# Patient Record
Sex: Male | Born: 1945
Health system: Southern US, Community
[De-identification: ages and names within clinical notes are randomized; demographics above are authoritative.]

## PROBLEM LIST (undated history)

## (undated) DIAGNOSIS — C61 Malignant neoplasm of prostate: Secondary | ICD-10-CM

## (undated) DIAGNOSIS — J449 Chronic obstructive pulmonary disease, unspecified: Secondary | ICD-10-CM

## (undated) DIAGNOSIS — E7800 Pure hypercholesterolemia, unspecified: Secondary | ICD-10-CM

## (undated) DIAGNOSIS — I712 Thoracic aortic aneurysm, without rupture, unspecified: Secondary | ICD-10-CM

## (undated) DIAGNOSIS — I700 Atherosclerosis of aorta: Secondary | ICD-10-CM

## (undated) DIAGNOSIS — I10 Essential (primary) hypertension: Secondary | ICD-10-CM

## (undated) DIAGNOSIS — I2510 Atherosclerotic heart disease of native coronary artery without angina pectoris: Secondary | ICD-10-CM

## (undated) HISTORY — DX: Thoracic aortic aneurysm, without rupture, unspecified: I71.20

## (undated) HISTORY — PX: ROTATOR CUFF REPAIR: SHX139

## (undated) HISTORY — DX: Atherosclerosis of aorta: I70.0

## (undated) HISTORY — DX: Thoracic aortic aneurysm, without rupture: I71.2

## (undated) HISTORY — PX: ABDOMINAL SURGERY: SHX537

## (undated) HISTORY — PX: NECK SURGERY: SHX720

## (undated) HISTORY — DX: Atherosclerotic heart disease of native coronary artery without angina pectoris: I25.10

---

## 2001-03-16 ENCOUNTER — Emergency Department (HOSPITAL_COMMUNITY): Admission: EM | Admit: 2001-03-16 | Discharge: 2001-03-16 | Payer: Self-pay | Admitting: Emergency Medicine

## 2001-03-16 ENCOUNTER — Encounter: Payer: Self-pay | Admitting: Emergency Medicine

## 2003-10-12 ENCOUNTER — Emergency Department (HOSPITAL_COMMUNITY): Admission: EM | Admit: 2003-10-12 | Discharge: 2003-10-13 | Payer: Self-pay | Admitting: Emergency Medicine

## 2013-07-07 ENCOUNTER — Emergency Department (HOSPITAL_COMMUNITY): Payer: Medicare Other

## 2013-07-07 ENCOUNTER — Encounter (HOSPITAL_COMMUNITY): Payer: Self-pay | Admitting: Emergency Medicine

## 2013-07-07 ENCOUNTER — Emergency Department (HOSPITAL_COMMUNITY)
Admission: EM | Admit: 2013-07-07 | Discharge: 2013-07-07 | Disposition: A | Discharge status: Home / Self Care | Payer: Medicare Other | Attending: Emergency Medicine | Admitting: Emergency Medicine

## 2013-07-07 DIAGNOSIS — R4182 Altered mental status, unspecified: Secondary | ICD-10-CM

## 2013-07-07 DIAGNOSIS — J449 Chronic obstructive pulmonary disease, unspecified: Secondary | ICD-10-CM | POA: Insufficient documentation

## 2013-07-07 DIAGNOSIS — M6281 Muscle weakness (generalized): Secondary | ICD-10-CM

## 2013-07-07 DIAGNOSIS — E7800 Pure hypercholesterolemia, unspecified: Secondary | ICD-10-CM | POA: Insufficient documentation

## 2013-07-07 DIAGNOSIS — J4489 Other specified chronic obstructive pulmonary disease: Secondary | ICD-10-CM | POA: Insufficient documentation

## 2013-07-07 DIAGNOSIS — I10 Essential (primary) hypertension: Secondary | ICD-10-CM | POA: Insufficient documentation

## 2013-07-07 DIAGNOSIS — R404 Transient alteration of awareness: Secondary | ICD-10-CM

## 2013-07-07 DIAGNOSIS — R531 Weakness: Secondary | ICD-10-CM

## 2013-07-07 DIAGNOSIS — R112 Nausea with vomiting, unspecified: Secondary | ICD-10-CM

## 2013-07-07 DIAGNOSIS — R42 Dizziness and giddiness: Secondary | ICD-10-CM | POA: Insufficient documentation

## 2013-07-07 HISTORY — DX: Pure hypercholesterolemia, unspecified: E78.00

## 2013-07-07 HISTORY — DX: Essential (primary) hypertension: I10

## 2013-07-07 HISTORY — DX: Chronic obstructive pulmonary disease, unspecified: J44.9

## 2013-07-07 LAB — I-STAT CHEM 8, ED
BUN: 22 mg/dL (ref 6–23)
CHLORIDE: 102 meq/L (ref 96–112)
Calcium, Ion: 1.12 mmol/L — ABNORMAL LOW (ref 1.13–1.30)
Creatinine, Ser: 1 mg/dL (ref 0.50–1.35)
Glucose, Bld: 131 mg/dL — ABNORMAL HIGH (ref 70–99)
HCT: 44 % (ref 39.0–52.0)
Hemoglobin: 15 g/dL (ref 13.0–17.0)
Potassium: 4.6 mEq/L (ref 3.7–5.3)
SODIUM: 139 meq/L (ref 137–147)
TCO2: 32 mmol/L (ref 0–100)

## 2013-07-07 LAB — URINALYSIS, ROUTINE W REFLEX MICROSCOPIC
BILIRUBIN URINE: NEGATIVE
Glucose, UA: NEGATIVE mg/dL
Hgb urine dipstick: NEGATIVE
KETONES UR: NEGATIVE mg/dL
LEUKOCYTES UA: NEGATIVE
Nitrite: NEGATIVE
PH: 8 (ref 5.0–8.0)
Protein, ur: NEGATIVE mg/dL
SPECIFIC GRAVITY, URINE: 1.02 (ref 1.005–1.030)
UROBILINOGEN UA: 0.2 mg/dL (ref 0.0–1.0)

## 2013-07-07 LAB — CBC
HCT: 42 % (ref 39.0–52.0)
Hemoglobin: 14.2 g/dL (ref 13.0–17.0)
MCH: 31.2 pg (ref 26.0–34.0)
MCHC: 33.8 g/dL (ref 30.0–36.0)
MCV: 92.3 fL (ref 78.0–100.0)
Platelets: 188 10*3/uL (ref 150–400)
RBC: 4.55 MIL/uL (ref 4.22–5.81)
RDW: 13.3 % (ref 11.5–15.5)
WBC: 11.9 10*3/uL — ABNORMAL HIGH (ref 4.0–10.5)

## 2013-07-07 LAB — COMPREHENSIVE METABOLIC PANEL
ALBUMIN: 3.7 g/dL (ref 3.5–5.2)
ALT: 30 U/L (ref 0–53)
AST: 33 U/L (ref 0–37)
Alkaline Phosphatase: 95 U/L (ref 39–117)
BILIRUBIN TOTAL: 0.3 mg/dL (ref 0.3–1.2)
BUN: 19 mg/dL (ref 6–23)
CHLORIDE: 102 meq/L (ref 96–112)
CO2: 30 meq/L (ref 19–32)
Calcium: 9.3 mg/dL (ref 8.4–10.5)
Creatinine, Ser: 0.86 mg/dL (ref 0.50–1.35)
GFR calc Af Amer: >90 mL/min (ref >90)
GFR, EST NON AFRICAN AMERICAN: 88 mL/min — AB (ref >90)
Glucose, Bld: 135 mg/dL — ABNORMAL HIGH (ref 70–99)
POTASSIUM: 4.9 meq/L (ref 3.7–5.3)
SODIUM: 141 meq/L (ref 137–147)
Total Protein: 6.7 g/dL (ref 6.0–8.3)

## 2013-07-07 LAB — CBG MONITORING, ED: Glucose-Capillary: 130 mg/dL — ABNORMAL HIGH (ref 70–99)

## 2013-07-07 LAB — PROTIME-INR
INR: 0.88 (ref 0.00–1.49)
Prothrombin Time: 11.8 seconds (ref 11.6–15.2)

## 2013-07-07 LAB — DIFFERENTIAL
BASOS ABS: 0 10*3/uL (ref 0.0–0.1)
BASOS PCT: 0 % (ref 0–1)
Eosinophils Absolute: 0.1 10*3/uL (ref 0.0–0.7)
Eosinophils Relative: 1 % (ref 0–5)
LYMPHS PCT: 15 % (ref 12–46)
Lymphs Abs: 1.8 10*3/uL (ref 0.7–4.0)
Monocytes Absolute: 0.8 10*3/uL (ref 0.1–1.0)
Monocytes Relative: 7 % (ref 3–12)
NEUTROS ABS: 9.3 10*3/uL — AB (ref 1.7–7.7)
NEUTROS PCT: 77 % (ref 43–77)

## 2013-07-07 LAB — RAPID URINE DRUG SCREEN, HOSP PERFORMED
Amphetamines: NOT DETECTED
Barbiturates: NOT DETECTED
Benzodiazepines: NOT DETECTED
COCAINE: NOT DETECTED
Opiates: POSITIVE — AB
Tetrahydrocannabinol: NOT DETECTED

## 2013-07-07 LAB — ETHANOL: Alcohol, Ethyl (B): <11 mg/dL (ref 0–11)

## 2013-07-07 LAB — APTT: APTT: 24 s (ref 24–37)

## 2013-07-07 LAB — I-STAT TROPONIN, ED: Troponin i, poc: 0 ng/mL (ref 0.00–0.08)

## 2013-07-07 MED ORDER — ONDANSETRON HCL 4 MG/2ML IJ SOLN
INTRAMUSCULAR | Status: AC
  Filled 2013-07-07: qty 2

## 2013-07-07 MED ORDER — ONDANSETRON HCL 4 MG/2ML IJ SOLN
4.0000 mg | Freq: Once | INTRAMUSCULAR | Status: AC
  Administered 2013-07-07: 4 mg via INTRAVENOUS

## 2013-07-07 MED ORDER — METOCLOPRAMIDE HCL 5 MG/ML IJ SOLN
10.0000 mg | Freq: Once | INTRAMUSCULAR | Status: AC
  Administered 2013-07-07: 10 mg via INTRAVENOUS
  Filled 2013-07-07: qty 2

## 2013-07-07 NOTE — ED Provider Notes (Signed)
CSN: [id number]     Arrival date & time 07/07/13  1202 History   First MD Initiated Contact with Patient 07/07/13 1207     Chief Complaint  Patient presents with  . Code Stroke     (Consider location/radiation/quality/duration/timing/severity/associated sxs/prior Treatment) Patient is a 68 y.o. male presenting with altered mental status. The history is provided by the patient and the EMS personnel.  Altered Mental Status Presenting symptoms: behavior changes, disorientation, lethargy and partial responsiveness   Severity:  Moderate Most recent episode:  Today Episode history:  Continuous Duration:  1 hour Timing:  Constant Progression:  Partially resolved Chronicity:  New Context: recent illness (began vomiting and felt ill upon arriving to work)   Associated symptoms: light-headedness, nausea and vomiting   Associated symptoms: no abdominal pain, no difficulty breathing, no fever and no palpitations     No past medical history on file. No past surgical history on file. No family history on file. History  Substance Use Topics  . Smoking status: Not on file  . Smokeless tobacco: Not on file  . Alcohol Use: Not on file    Review of Systems  Constitutional: Negative for fever.  Cardiovascular: Negative for palpitations.  Gastrointestinal: Positive for nausea and vomiting. Negative for abdominal pain.  Neurological: Positive for light-headedness.  All other systems reviewed and are negative.     Allergies  Review of patient's allergies indicates not on file.  Home Medications   Prior to Admission medications   Not on File   There were no vitals taken for this visit. Physical Exam  Constitutional: He appears well-developed and well-nourished. No distress.  HENT:  Head: Normocephalic and atraumatic.  Eyes: Conjunctivae are normal.  Neck: Neck supple. No tracheal deviation present.  Cardiovascular: Normal rate and regular rhythm.   Pulmonary/Chest: Effort normal.  No respiratory distress.  Abdominal: Soft. He exhibits no distension.  Neurological:  Inconsistent neuro exam with slowing and left-sided resistance to pass of motion with equal strength intact, 3 mm equal and reactive pupils bilaterally, appropriate withdrawal to noxious stimuli  Skin: Skin is warm and dry.  Psychiatric: He is slowed (with improvement over the course of the visit).    ED Course  Procedures (including critical care time) Labs Review Labs Reviewed  CBC - Abnormal; Notable for the following:    WBC 11.9 (*)    All other components within normal limits  DIFFERENTIAL - Abnormal; Notable for the following:    Neutro Abs 9.3 (*)    All other components within normal limits  COMPREHENSIVE METABOLIC PANEL - Abnormal; Notable for the following:    Glucose, Bld 135 (*)    GFR calc non Af Amer 88 (*)    All other components within normal limits  URINE RAPID DRUG SCREEN (HOSP PERFORMED) - Abnormal; Notable for the following:    Opiates POSITIVE (*)    All other components within normal limits  I-STAT CHEM 8, ED - Abnormal; Notable for the following:    Glucose, Bld 131 (*)    Calcium, Ion 1.12 (*)    All other components within normal limits  CBG MONITORING, ED - Abnormal; Notable for the following:    Glucose-Capillary 130 (*)    All other components within normal limits  ETHANOL  PROTIME-INR  APTT  URINALYSIS, ROUTINE W REFLEX MICROSCOPIC  I-STAT TROPOININ, ED  I-STAT TROPOININ, ED    Imaging Review Ct Head Wo Contrast  07/07/2013   CLINICAL DATA:  Left side weakness, code stroke  EXAM: CT HEAD WITHOUT CONTRAST  TECHNIQUE: Contiguous axial images were obtained from the base of the skull through the vertex without intravenous contrast.  COMPARISON:  07/06/2005; correlation MRI brain 01/14/2010  FINDINGS: Normal ventricular morphology.  No midline shift or mass effect.  Normal appearance of brain parenchyma.  No intracranial hemorrhage, mass lesion or evidence acute  infarction.  No extra-axial fluid collections.  Nasopharyngeal airway present.  Bones and sinuses unremarkable.  IMPRESSION: No acute intracranial abnormalities.  Findings called to Dr. Nicole Kindred on 07/07/2013 at 1236 hrs.   Electronically Signed   By: Lavonia Dana M.D.   On: 07/07/2013 12:39   Dg Chest Port 1 View  07/07/2013   CLINICAL DATA:  Hypertension  EXAM: PORTABLE CHEST - 1 VIEW  COMPARISON:  07/24/2009  FINDINGS: The heart size and mediastinal contours are within normal limits. Both lungs are clear. The visualized skeletal structures are unremarkable.  IMPRESSION: No active disease.   Electronically Signed   By: Inez Catalina M.D.   On: 07/07/2013 14:38     EKG Interpretation   Date/Time:  Monday July 07 2013 12:18:26 EDT Ventricular Rate:  70 PR Interval:  153 QRS Duration: 104 QT Interval:  415 QTC Calculation: 448 R Axis:   76 Text Interpretation:  Sinus rhythm No prior for comparison Confirmed by  HORTON  MD, Loma Sousa ([id number]) on 07/07/2013 2:01:23 PM      MDM   Final diagnoses:  Nausea & vomiting  Altered mental status  68 y.o. male presents with altered have ear following an episode where he was driving to work, parking lot, and he came diaphoretic and nauseated with vomiting. The patient was resolving on arrival and was resistance to moving his left arm presumably due to pain after speaking with family but retains full strength of the extremity. He has had some left shoulder problems for the last months and has been seen and evaluated by pain management for this. His nausea is controlled with Zofran followed by Reglan for refractory symptoms, the patient was able to sleep comfortably in the emergency department, and resolved completely without any further symptoms.  Patient was initially triaged as a code stroke due to odd behavior in route with EMS but a negative CT scan, negative infectious workup, negative metabolic workup. The patient is positive for opiates and states that  he has been taking Norco for his shoulder pain recently but a normal amount. Based on the story and complete resolution of symptoms is likely to be related to overheating in the car, ingestion of home opiates, vasovagal syncope in the setting of nausea and vomiting, or other transient etiology. There is no evidence of intracranial cause for abnormality with the patient's heart. Discussed with the family to followup with primary care physician this week return to the emergency department with any refractory symptoms or repeat altered mental status.    Leo Grosser, MD 07/07/13 [phone]

## 2013-07-07 NOTE — ED Notes (Addendum)
Pt to ED via [facility] for L sided weakness; EMS was called out for pt "not acting his normal self;" Pt diaphoretic, pale, cold to the touch and c/o nausea. Per EMS, en route patient became "unresponsive," nasal trumpet placed,  grips weaker on L side.  BP 161/100; HR 50s, EKG showing sinus brady.

## 2013-07-07 NOTE — ED Notes (Signed)
Nurse said she walked him and he did well

## 2013-07-07 NOTE — Discharge Instructions (Signed)
Altered Mental Status Altered mental status most often refers to an abnormal change in your responsiveness and awareness. It can affect your speech, thought, mobility, memory, attention span, or alertness. It can range from slight confusion to complete unresponsiveness (coma). Altered mental status can be a sign of a serious underlying medical condition. Rapid evaluation and medical treatment is necessary for patients having an altered mental status. CAUSES   Low blood sugar (hypoglycemia) or diabetes.  Severe loss of body fluids (dehydration) or a body salt (electrolyte) imbalance.  A stroke or other neurologic problem, such as dementia or delirium.  A head injury or tumor.  A drug or alcohol overdose.  Exposure to toxins or poisons.  Depression, anxiety, and stress.  A low oxygen level (hypoxia).  An infection.  Blood loss.  Twitching or shaking (seizure).  Heart problems, such as heart attack or heart rhythm problems (arrhythmias).  A body temperature that is too low or too high (hypothermia or hyperthermia). DIAGNOSIS  A diagnosis is based on your history, symptoms, physical and neurologic examinations, and diagnostic tests. Diagnostic tests may include:  Measurement of your blood pressure, pulse, breathing, and oxygen levels (vital signs).  Blood tests.  Urine tests.  X-ray exams.  A computerized magnetic scan (magnetic resonance imaging, MRI).  A computerized X-ray scan (computed tomography, CT scan). TREATMENT  Treatment will depend on the cause. Treatment may include:  Management of an underlying medical or mental health condition.  Critical care or support in the hospital. [place]   Only take over-the-counter or prescription medicines for pain, discomfort, or fever as directed by your caregiver.  Manage underlying conditions as directed by your caregiver.  Eat a healthy, well-balanced diet to maintain strength.  Join a support group or  prevention program to cope with the condition or trauma that caused the altered mental status. Ask your caregiver to help choose a program that works for you.  Follow up with your caregiver for further examination, therapy, or testing as directed. SEEK MEDICAL CARE IF:   You feel unwell or have chills.  You or your family notice a change in your behavior or your alertness.  You have trouble following your caregiver's treatment plan.  You have questions or concerns. SEEK IMMEDIATE MEDICAL CARE IF:   You have a rapid heartbeat or have chest pain.  You have difficulty breathing.  You have a fever.  You have a headache with a stiff neck.  You cough up blood.  You have blood in your urine or stool.  You have severe agitation or confusion. MAKE SURE YOU:   Understand these instructions.  Will watch your condition.  Will get help right away if you are not doing well or get worse. Document Released: 07/13/2009 Document Revised: 04/17/2011 Document Reviewed: 07/13/2009 [facility] Patient Information 2014 [place]. Nausea and Vomiting Nausea is a sick feeling that often comes before throwing up (vomiting). Vomiting is a reflex where stomach contents come out of your mouth. Vomiting can cause severe loss of body fluids (dehydration). Children and elderly adults can become dehydrated quickly, especially if they also have diarrhea. Nausea and vomiting are symptoms of a condition or disease. It is important to find the cause of your symptoms. CAUSES   Direct irritation of the stomach lining. This irritation can result from increased acid production (gastroesophageal reflux disease), infection, food poisoning, taking certain medicines (such as nonsteroidal anti-inflammatory drugs), alcohol use, or tobacco use.  Signals from the brain.These signals could be caused by  a headache, heat exposure, an inner ear disturbance, increased pressure in the brain from injury, infection, a tumor,  or a concussion, pain, emotional stimulus, or metabolic problems.  An obstruction in the gastrointestinal tract (bowel obstruction).  Illnesses such as diabetes, hepatitis, gallbladder problems, appendicitis, kidney problems, cancer, sepsis, atypical symptoms of a heart attack, or eating disorders.  Medical treatments such as chemotherapy and radiation.  Receiving medicine that makes you sleep (general anesthetic) during surgery. DIAGNOSIS Your caregiver may ask for tests to be done if the problems do not improve after a few days. Tests may also be done if symptoms are severe or if the reason for the nausea and vomiting is not clear. Tests may include:  Urine tests.  Blood tests.  Stool tests.  Cultures (to look for evidence of infection).  X-rays or other imaging studies. Test results can help your caregiver make decisions about treatment or the need for additional tests. TREATMENT You need to stay well hydrated. Drink frequently but in small amounts.You may wish to drink water, sports drinks, clear broth, or eat frozen ice pops or gelatin dessert to help stay hydrated.When you eat, eating slowly may help prevent nausea.There are also some antinausea medicines that may help prevent nausea. HOME CARE INSTRUCTIONS   Take all medicine as directed by your caregiver.  If you do not have an appetite, do not force yourself to eat. However, you must continue to drink fluids.  If you have an appetite, eat a normal diet unless your caregiver tells you differently.  Eat a variety of complex carbohydrates (rice, wheat, potatoes, bread), lean meats, yogurt, fruits, and vegetables.  Avoid high-fat foods because they are more difficult to digest.  Drink enough water and fluids to keep your urine clear or pale yellow.  If you are dehydrated, ask your caregiver for specific rehydration instructions. Signs of dehydration may include:  Severe thirst.  Dry lips and  mouth.  Dizziness.  Dark urine.  Decreasing urine frequency and amount.  Confusion.  Rapid breathing or pulse. SEEK IMMEDIATE MEDICAL CARE IF:   You have blood or brown flecks (like coffee grounds) in your vomit.  You have black or bloody stools.  You have a severe headache or stiff neck.  You are confused.  You have severe abdominal pain.  You have chest pain or trouble breathing.  You do not urinate at least once every 8 hours.  You develop cold or clammy skin.  You continue to vomit for longer than 24 to 48 hours.  You have a fever. MAKE SURE YOU:   Understand these instructions.  Will watch your condition.  Will get help right away if you are not doing well or get worse. Document Released: 01/23/2005 Document Revised: 04/17/2011 Document Reviewed: 06/22/2010 [facility] Patient Information 2014 [place], [place].

## 2013-07-07 NOTE — ED Notes (Signed)
Dr. Horton at bedside. 

## 2013-07-07 NOTE — ED Provider Notes (Signed)
I saw and evaluated the patient, reviewed the resident's note and I agree with the findings and plan.   EKG Interpretation   Date/Time:  Monday July 07 2013 12:18:26 EDT Ventricular Rate:  70 PR Interval:  153 QRS Duration: 104 QT Interval:  415 QTC Calculation: 448 R Axis:   76 Text Interpretation:  Sinus rhythm No prior for comparison Confirmed by  HORTON  MD, Loma Sousa ([id number]) on 07/07/2013 2:01:23 PM      Patient presented as code stroke.  ON initial evaluation, somnolent but arousable.  Nonfocal.  CT neg and evaluated by neurology.  Low suspicion of stroke.  After approx 15 min, patient back to baseline.  Reports taking "pain medication" this AM but states that is normal for him.  Reports that he didn't feel good, got hot and vomited.  Denies SOB or CP.  Nontoxic and nonfocal on exam.  W/U neg including EKG and trop.  Patient at baseline, ambulatory and able to PO challenge.  Unknown cause of AMS this morning but w/u reassuring.    After history, exam, and medical workup I feel the patient has been appropriately medically screened and is safe for discharge home. Pertinent diagnoses were discussed with the patient. Patient was given return precautions.     Merryl Hacker, MD 07/07/13 [phone]

## 2013-07-07 NOTE — Code Documentation (Signed)
68yo male arriving to [facility] via [facility].  EMS was called due to patient having to be assisted from his car this morning at work at 1100.  EMS called a Code Stroke due to left sided weakness assessed en route.  Patient with n/v and hypertensive.  Patient taken to CT on arrival.  NIHSS 1 on arrival for decreased sensation on the left side.  Patient with good strength on left side per Dr. Nicole Kindred, but resists during exam.  RRT RN called patient's girlfriend who reports that the patient was not feeling well at 0900 this morning d/t n/v.  He later called her around 1030 and said that he might not go to work d/t not feeling well.  She reports that he has had left sided weakness for weeks now and has been going to the [place] for this.  Patient reports pain in the left arm and also reports that he goes to the [place] for his neck and arm.  Patient back to the room.  Patient not cooperating well with exam.  Patient vomiting despite Zofran IVP.  Dr. Nicole Kindred at bedside for exam.  Code stroke canceled.  No acute stroke treatment at this time.  Bedside handoff with ED RN Elmyra Ricks.

## 2013-07-07 NOTE — ED Provider Notes (Signed)
12:10 PM  Patient assessed as a stroke upon arrival. Last seen normal report of 11 a.m. when he drove himself to work. Reported feeling poorly. Noted by coworkers at [employer] to be pale, diaphoretic, and "unresponsive." EMS was called. Upon their evaluation, he was not able to ride much history. He was noted to be pale, diaphoretic, and vomiting. EMS noted left-sided weakness. History of hypertension. Noted to be hypertensive in route.  Patient is following commands but not speaking on exam. No lateralizing weakness noted. Proceeded to CT scanner for further evaluation.  Merryl Hacker, MD 07/07/13 1212

## 2013-07-07 NOTE — Consult Note (Signed)
Reason for Consult: altered mental status with equivocal left side weakness  HPI:                                                                                                                                          Blake Sutton is an 68 y.o. male with a history of hypertension, hypercholesterolemia and COPD who was brought to the emergency room with altered mental status and equivocal left-sided weakness which was noted by EMS in route to the hospital. He was initially noted to be lethargic with nausea and vomiting as well as paleness. No focal deficit was noted on initial evaluation in the emergency room. CT scan of his head was obtained which showed no acute intracranial abnormality. Patient's mental status improved during his emergency room evaluation. Examination showed reduced movement of his left extremities do to pain with movement, particularly his left upper extremity with reduced range of motion of his shoulder secondary to pain and tenderness.it equivocal numbness involving left side of his face compared to right. Otherwise neurological examination was unremarkable. Code stroke was canceled.  Past Medical History  Diagnosis Date  . Hypertension   . Hypercholesteremia   . COPD (chronic obstructive pulmonary disease)     Past Surgical History  Procedure Laterality Date  . Rotator cuff repair    . Neck surgery    . Abdominal surgery      "repair bleeding ulcers"    No family history on file.  Social History:  reports that he has been smoking.  He does not have any smokeless tobacco history on file. He reports that he does not drink alcohol or use illicit drugs.  Allergies not on file  MEDICATIONS:                                                                                                                     Currently unavailable.   ROS:  History obtained from the patient  General ROS: negative for - chills, fatigue, fever, night sweats, weight gain or weight loss Psychological ROS: negative for - behavioral disorder, hallucinations, memory difficulties, mood swings or suicidal ideation Ophthalmic ROS: negative for - blurry vision, double vision, eye pain or loss of vision ENT ROS: negative for - epistaxis, nasal discharge, oral lesions, sore throat, tinnitus or vertigo Allergy and Immunology ROS: negative for - hives or itchy/watery eyes Hematological and Lymphatic ROS: negative for - bleeding problems, bruising or swollen lymph nodes Endocrine ROS: negative for - galactorrhea, hair pattern changes, polydipsia/polyuria or temperature intolerance Respiratory ROS: negative for - cough, hemoptysis, shortness of breath or wheezing Cardiovascular ROS: negative for - chest pain, dyspnea on exertion, edema or irregular heartbeat Gastrointestinal ROS: negative for - abdominal pain, diarrhea, hematemesis, nausea/vomiting or stool incontinence Genito-Urinary ROS: negative for - dysuria, hematuria, incontinence or urinary frequency/urgency Musculoskeletal ROS: chronic left neck and shoulder pain Neurological ROS: as noted in HPI Dermatological ROS: negative for rash and skin lesion changes   There were no vitals taken for this visit.   Neurologic Examination:                                                                                                      Mental Status: Alert, oriented, thought content appropriate.  Speech fluent without evidence of aphasia. Able to follow commands without difficulty. Cranial Nerves: II-Visual fields were normal. III/IV/VI-Pupils were equal and reacted. Extraocular movements were full and conjugate.    V/VII-equivocal mild left facial numbness; no facial weakness. VIII-normal. X-normal speech. Motor: 5/5 bilaterally with normal tone and bulk Sensory: Normal throughout. Deep Tendon  Reflexes: 2+ and symmetric. Plantars: Flexor bilaterally Cerebellar: Normal finger-to-nose testing.  No results found for this basename: cbc, bmp, coags, chol, tri, ldl, hga1c    Results for orders placed during the hospital encounter of 07/07/13 (from the past 48 hour(s))  APTT     Status: None   Collection Time    07/07/13 12:10 PM      Result Value Ref Range   aPTT 24  24 - 37 seconds  CBC     Status: Abnormal   Collection Time    07/07/13 12:10 PM      Result Value Ref Range   WBC 11.9 (*) 4.0 - 10.5 K/uL   RBC 4.55  4.22 - 5.81 MIL/uL   Hemoglobin 14.2  13.0 - 17.0 g/dL   HCT 42.0  39.0 - 52.0 %   MCV 92.3  78.0 - 100.0 fL   MCH 31.2  26.0 - 34.0 pg   MCHC 33.8  30.0 - 36.0 g/dL   RDW 13.3  11.5 - 15.5 %   Platelets 188  150 - 400 K/uL  DIFFERENTIAL     Status: Abnormal   Collection Time    07/07/13 12:10 PM      Result Value Ref Range   Neutrophils Relative % 77  43 - 77 %   Neutro Abs 9.3 (*) 1.7 - 7.7 K/uL   Lymphocytes Relative 15  12 -  46 %   Lymphs Abs 1.8  0.7 - 4.0 K/uL   Monocytes Relative 7  3 - 12 %   Monocytes Absolute 0.8  0.1 - 1.0 K/uL   Eosinophils Relative 1  0 - 5 %   Eosinophils Absolute 0.1  0.0 - 0.7 K/uL   Basophils Relative 0  0 - 1 %   Basophils Absolute 0.0  0.0 - 0.1 K/uL  I-STAT TROPOININ, ED     Status: None   Collection Time    07/07/13 12:14 PM      Result Value Ref Range   Troponin i, poc 0.00  0.00 - 0.08 ng/mL   Comment 3            Comment: Due to the release kinetics of cTnI,     a negative result within the first hours     of the onset of symptoms does not rule out     myocardial infarction with certainty.     If myocardial infarction is still suspected,     repeat the test at appropriate intervals.  CBG MONITORING, ED     Status: Abnormal   Collection Time    07/07/13 12:15 PM      Result Value Ref Range   Glucose-Capillary 130 (*) 70 - 99 mg/dL   Comment 1 Notify RN     Comment 2 Documented in Chart    I-STAT CHEM  8, ED     Status: Abnormal   Collection Time    07/07/13 12:16 PM      Result Value Ref Range   Sodium 139  137 - 147 mEq/L   Potassium 4.6  3.7 - 5.3 mEq/L   Chloride 102  96 - 112 mEq/L   BUN 22  6 - 23 mg/dL   Creatinine, Ser 1.00  0.50 - 1.35 mg/dL   Glucose, Bld 131 (*) 70 - 99 mg/dL   Calcium, Ion 1.12 (*) 1.13 - 1.30 mmol/L   TCO2 32  0 - 100 mmol/L   Hemoglobin 15.0  13.0 - 17.0 g/dL   HCT 44.0  39.0 - 52.0 %    No results found.   Assessment/Plan: 68 year old man with altered mental status and reduced movements of his left extremities which appears to be due to guarding and reduced range of motion of extremities secondary to pain with movement. Patient has no clinical signs of acute TIA or stroke.  No further neurological intervention is indicated no further neurodiagnostic studies warranted.  C.R. Nicole Kindred, MD [facility] [phone]  07/07/2013, 12:40 PM

## 2013-07-07 NOTE — ED Notes (Signed)
Per pt, he drove himself to a shop where he hangs out; reports feeling sick since this morning associated with nausea, vomiting, and diaphoretic. Has been seen at [facility] for neck pain that extends to L arm.

## 2013-07-07 NOTE — ED Notes (Signed)
Pt attempted to use urinal x 2; unable to provide sample.

## 2013-07-07 NOTE — ED Notes (Signed)
Per family, patient is not acting per baseline; concerned that pt needs to be admitted for observation. Pt reports he feels fine and wants to go home. Dr.Horton aware of situation.

## 2013-07-07 NOTE — ED Notes (Signed)
Pt ambulated without assistance around exam room; denies weakness or dizziness.

## 2013-07-07 NOTE — ED Notes (Signed)
NOTIFIED BTamala Sutton ,RN FOR PATIENTS LAB RESULTS OF I-STAT CHEM8+ ,I-STAT TROPONIN, @12 :30 PM ,07/07/2013.

## 2017-07-30 DIAGNOSIS — H5203 Hypermetropia, bilateral: Secondary | ICD-10-CM | POA: Diagnosis not present

## 2017-07-30 DIAGNOSIS — H2513 Age-related nuclear cataract, bilateral: Secondary | ICD-10-CM | POA: Diagnosis not present

## 2017-07-30 DIAGNOSIS — D3131 Benign neoplasm of right choroid: Secondary | ICD-10-CM | POA: Diagnosis not present

## 2017-09-14 DIAGNOSIS — M25572 Pain in left ankle and joints of left foot: Secondary | ICD-10-CM | POA: Diagnosis not present

## 2017-09-14 DIAGNOSIS — S82402A Unspecified fracture of shaft of left fibula, initial encounter for closed fracture: Secondary | ICD-10-CM | POA: Diagnosis not present

## 2017-09-17 DIAGNOSIS — S82832A Other fracture of upper and lower end of left fibula, initial encounter for closed fracture: Secondary | ICD-10-CM | POA: Diagnosis not present

## 2020-07-03 ENCOUNTER — Inpatient Hospital Stay (HOSPITAL_COMMUNITY)
Admission: EM | Admit: 2020-07-03 | Discharge: 2020-07-13 | DRG: 233 | Disposition: A | Discharge status: Home / Self Care | Payer: Medicare Other | Attending: Thoracic Surgery (Cardiothoracic Vascular Surgery) | Admitting: Thoracic Surgery (Cardiothoracic Vascular Surgery)

## 2020-07-03 ENCOUNTER — Emergency Department (HOSPITAL_COMMUNITY): Payer: Medicare Other

## 2020-07-03 ENCOUNTER — Other Ambulatory Visit: Payer: Self-pay

## 2020-07-03 DIAGNOSIS — J189 Pneumonia, unspecified organism: Secondary | ICD-10-CM | POA: Diagnosis present

## 2020-07-03 DIAGNOSIS — E7800 Pure hypercholesterolemia, unspecified: Secondary | ICD-10-CM | POA: Diagnosis not present

## 2020-07-03 DIAGNOSIS — D72829 Elevated white blood cell count, unspecified: Secondary | ICD-10-CM | POA: Diagnosis present

## 2020-07-03 DIAGNOSIS — C9590 Leukemia, unspecified not having achieved remission: Secondary | ICD-10-CM | POA: Diagnosis present

## 2020-07-03 DIAGNOSIS — Z951 Presence of aortocoronary bypass graft: Secondary | ICD-10-CM

## 2020-07-03 DIAGNOSIS — I110 Hypertensive heart disease with heart failure: Secondary | ICD-10-CM | POA: Diagnosis not present

## 2020-07-03 DIAGNOSIS — J90 Pleural effusion, not elsewhere classified: Secondary | ICD-10-CM

## 2020-07-03 DIAGNOSIS — D62 Acute posthemorrhagic anemia: Secondary | ICD-10-CM | POA: Diagnosis not present

## 2020-07-03 DIAGNOSIS — I214 Non-ST elevation (NSTEMI) myocardial infarction: Principal | ICD-10-CM | POA: Diagnosis present

## 2020-07-03 DIAGNOSIS — R0902 Hypoxemia: Secondary | ICD-10-CM | POA: Diagnosis not present

## 2020-07-03 DIAGNOSIS — I25110 Atherosclerotic heart disease of native coronary artery with unstable angina pectoris: Secondary | ICD-10-CM | POA: Diagnosis not present

## 2020-07-03 DIAGNOSIS — A419 Sepsis, unspecified organism: Secondary | ICD-10-CM | POA: Diagnosis not present

## 2020-07-03 DIAGNOSIS — K219 Gastro-esophageal reflux disease without esophagitis: Secondary | ICD-10-CM | POA: Diagnosis not present

## 2020-07-03 DIAGNOSIS — Z79899 Other long term (current) drug therapy: Secondary | ICD-10-CM

## 2020-07-03 DIAGNOSIS — Z20822 Contact with and (suspected) exposure to covid-19: Secondary | ICD-10-CM | POA: Diagnosis present

## 2020-07-03 DIAGNOSIS — I10 Essential (primary) hypertension: Secondary | ICD-10-CM | POA: Diagnosis not present

## 2020-07-03 DIAGNOSIS — Z01810 Encounter for preprocedural cardiovascular examination: Secondary | ICD-10-CM | POA: Diagnosis not present

## 2020-07-03 DIAGNOSIS — I959 Hypotension, unspecified: Secondary | ICD-10-CM | POA: Diagnosis not present

## 2020-07-03 DIAGNOSIS — I4891 Unspecified atrial fibrillation: Secondary | ICD-10-CM | POA: Diagnosis not present

## 2020-07-03 DIAGNOSIS — R7989 Other specified abnormal findings of blood chemistry: Secondary | ICD-10-CM | POA: Diagnosis present

## 2020-07-03 DIAGNOSIS — R001 Bradycardia, unspecified: Secondary | ICD-10-CM | POA: Diagnosis not present

## 2020-07-03 DIAGNOSIS — R0689 Other abnormalities of breathing: Secondary | ICD-10-CM | POA: Diagnosis not present

## 2020-07-03 DIAGNOSIS — R9431 Abnormal electrocardiogram [ECG] [EKG]: Secondary | ICD-10-CM

## 2020-07-03 DIAGNOSIS — I455 Other specified heart block: Secondary | ICD-10-CM | POA: Diagnosis not present

## 2020-07-03 DIAGNOSIS — I712 Thoracic aortic aneurysm, without rupture: Secondary | ICD-10-CM | POA: Diagnosis not present

## 2020-07-03 DIAGNOSIS — I5030 Unspecified diastolic (congestive) heart failure: Secondary | ICD-10-CM

## 2020-07-03 DIAGNOSIS — E876 Hypokalemia: Secondary | ICD-10-CM | POA: Diagnosis not present

## 2020-07-03 DIAGNOSIS — F17200 Nicotine dependence, unspecified, uncomplicated: Secondary | ICD-10-CM | POA: Diagnosis present

## 2020-07-03 DIAGNOSIS — J449 Chronic obstructive pulmonary disease, unspecified: Secondary | ICD-10-CM | POA: Diagnosis not present

## 2020-07-03 DIAGNOSIS — Z4682 Encounter for fitting and adjustment of non-vascular catheter: Secondary | ICD-10-CM | POA: Diagnosis not present

## 2020-07-03 DIAGNOSIS — I340 Nonrheumatic mitral (valve) insufficiency: Secondary | ICD-10-CM | POA: Diagnosis not present

## 2020-07-03 DIAGNOSIS — R1111 Vomiting without nausea: Secondary | ICD-10-CM | POA: Diagnosis not present

## 2020-07-03 DIAGNOSIS — J9601 Acute respiratory failure with hypoxia: Secondary | ICD-10-CM | POA: Diagnosis not present

## 2020-07-03 DIAGNOSIS — I5031 Acute diastolic (congestive) heart failure: Secondary | ICD-10-CM | POA: Diagnosis not present

## 2020-07-03 DIAGNOSIS — E785 Hyperlipidemia, unspecified: Secondary | ICD-10-CM | POA: Diagnosis present

## 2020-07-03 DIAGNOSIS — I2510 Atherosclerotic heart disease of native coronary artery without angina pectoris: Secondary | ICD-10-CM | POA: Diagnosis not present

## 2020-07-03 DIAGNOSIS — I517 Cardiomegaly: Secondary | ICD-10-CM | POA: Diagnosis not present

## 2020-07-03 DIAGNOSIS — R1110 Vomiting, unspecified: Secondary | ICD-10-CM | POA: Diagnosis not present

## 2020-07-03 DIAGNOSIS — R0602 Shortness of breath: Secondary | ICD-10-CM | POA: Diagnosis not present

## 2020-07-03 DIAGNOSIS — J984 Other disorders of lung: Secondary | ICD-10-CM | POA: Diagnosis not present

## 2020-07-03 DIAGNOSIS — J9811 Atelectasis: Secondary | ICD-10-CM | POA: Diagnosis not present

## 2020-07-03 LAB — CBC WITH DIFFERENTIAL/PLATELET
Abs Immature Granulocytes: 0.08 10*3/uL — ABNORMAL HIGH (ref 0.00–0.07)
Basophils Absolute: 0.1 10*3/uL (ref 0.0–0.1)
Basophils Relative: 1 %
Eosinophils Absolute: 0.2 10*3/uL (ref 0.0–0.5)
Eosinophils Relative: 1 %
HCT: 43.1 % (ref 39.0–52.0)
Hemoglobin: 13.7 g/dL (ref 13.0–17.0)
Immature Granulocytes: 1 %
Lymphocytes Relative: 20 %
Lymphs Abs: 2.9 10*3/uL (ref 0.7–4.0)
MCH: 29.2 pg (ref 26.0–34.0)
MCHC: 31.8 g/dL (ref 30.0–36.0)
MCV: 91.9 fL (ref 80.0–100.0)
Monocytes Absolute: 1 10*3/uL (ref 0.1–1.0)
Monocytes Relative: 7 %
Neutro Abs: 10.5 10*3/uL — ABNORMAL HIGH (ref 1.7–7.7)
Neutrophils Relative %: 70 %
Platelets: 335 10*3/uL (ref 150–400)
RBC: 4.69 MIL/uL (ref 4.22–5.81)
RDW: 14.3 % (ref 11.5–15.5)
WBC: 14.8 10*3/uL — ABNORMAL HIGH (ref 4.0–10.5)
nRBC: 0 % (ref 0.0–0.2)

## 2020-07-03 MED ORDER — SODIUM CHLORIDE 0.9 % IV SOLN: INTRAVENOUS | Status: DC

## 2020-07-03 NOTE — ED Provider Notes (Signed)
[facility] Provider Note  CSN: [id number] Arrival date & time: 07/03/20 2244  Chief Complaint(s) Respiratory Distress  HPI Blake Sutton is a 75 y.o. male with a past medical history listed below including reported leukemia and COPD who presents to the emergency department with sudden onset shortness of breath which he described as being suffocated (like a bag was placed over his head).  He denied any associated chest pain. Pain began approximately 2 hours prior to arrival. Patient took his home inhalers and had minimal relief prompting his call to his son and EMS. When EMS arrived they noted patient was hypoxic with sats in the 70s on room air. He was placed on nonrebreather and given breathing treatments, magnesium and Solu-Medrol.  Patient reported for the past several days he has been feeling worn down.  He denied any swelling or edema.  Denied any prior history of clots.  HPI  Past Medical History Past Medical History:  Diagnosis Date  . COPD (chronic obstructive pulmonary disease) ([place])   . Hypercholesteremia   . Hypertension    Patient Active Problem List   Diagnosis Date Noted  . NSTEMI (non-ST elevated myocardial infarction) ([place]) 07/04/2020  . Essential hypertension 07/04/2020  . COPD (chronic obstructive pulmonary disease) ([place]) 07/04/2020  . Hyperlipidemia 07/04/2020  . Elevated d-dimer 07/04/2020  . Leukocytosis 07/04/2020  . Prolonged QT interval 07/04/2020  . CAP (community acquired pneumonia) 07/04/2020  . Altered mental status 07/07/2013  . Left-sided weakness 07/07/2013   Home Medication(s) Prior to Admission medications   Medication Sig Start Date End Date Taking? Authorizing Provider  albuterol (PROVENTIL HFA;VENTOLIN HFA) 108 (90 BASE) MCG/ACT inhaler Inhale 1-2 puffs into the lungs every 6 (six) hours as needed for wheezing or shortness of breath.   Yes [provider]  amLODipine (NORVASC) 5 MG tablet Take 5 mg by  mouth daily. 10/06/19  Yes [provider]  atenolol (TENORMIN) 25 MG tablet Take 25 mg by mouth daily.   Yes [provider]  buPROPion (WELLBUTRIN SR) 100 MG 12 hr tablet Take 100 mg by mouth 2 (two) times daily. 10/06/19  Yes [provider]  celecoxib (CELEBREX) 200 MG capsule Take 200 mg by mouth daily.  06/25/13  Yes [provider]  FLUoxetine (PROZAC) 20 MG capsule Take 20 mg by mouth daily.   Yes [provider]  hydrochlorothiazide (HYDRODIURIL) 25 MG tablet Take 25 mg by mouth daily.   Yes [provider]  HYDROcodone-acetaminophen (NORCO/VICODIN) 5-325 MG per tablet Take 1 tablet by mouth every 6 (six) hours as needed (pain).  06/25/13  Yes [provider]  meloxicam (MOBIC) 15 MG tablet Take 15 mg by mouth daily as needed for pain. 10/06/19  Yes [provider]  omeprazole (PRILOSEC) 20 MG capsule Take 20 mg by mouth daily.   Yes [provider]  sildenafil (VIAGRA) 100 MG tablet Take 100 mg by mouth as needed for erectile dysfunction. 10/06/19  Yes [provider]  simvastatin (ZOCOR) 40 MG tablet Take 40 mg by mouth at bedtime.   Yes [provider]  tamsulosin (FLOMAX) 0.4 MG CAPS capsule Take 0.4 mg by mouth daily. 10/06/19  Yes [provider]  traZODone (DESYREL) 50 MG tablet Take 50 mg by mouth at bedtime.   Yes [provider]  Past Surgical History Past Surgical History:  Procedure Laterality Date  . ABDOMINAL SURGERY     "repair bleeding ulcers"  . NECK SURGERY    . ROTATOR CUFF REPAIR     Family History History reviewed. No pertinent family history.  Social History Social History   Tobacco Use  . Smoking status: Current Every Day Smoker  Substance Use Topics  . Alcohol use: No  . Drug use: No   Allergies Oxycodone and  Pravastatin  Review of Systems Review of Systems All other systems are reviewed and are negative for acute change except as noted in the HPI  Physical Exam Vital Signs  I have reviewed the triage vital signs BP (!) 152/101 (BP Location: Right Arm)   Pulse 94   Temp 98 F (36.7 C) (Oral)   Resp 20   Ht 5\' 10"  (1.778 m)   Wt 84.4 kg   SpO2 96%   BMI 26.70 kg/m   Physical Exam Vitals reviewed.  Constitutional:      General: He is not in acute distress.    Appearance: He is well-developed. He is not diaphoretic.  HENT:     Head: Normocephalic and atraumatic.     Nose: Nose normal.  Eyes:     General: No scleral icterus.       Right eye: No discharge.        Left eye: No discharge.     Conjunctiva/sclera: Conjunctivae normal.     Pupils: Pupils are equal, round, and reactive to light.  Cardiovascular:     Rate and Rhythm: Normal rate and regular rhythm.     Heart sounds: No murmur heard. No friction rub. No gallop.   Pulmonary:     Effort: Pulmonary effort is normal. Tachypnea present. No respiratory distress.     Breath sounds: No stridor. Rales (fine) present. No wheezing or rhonchi.  Abdominal:     General: There is no distension.     Palpations: Abdomen is soft.     Tenderness: There is no abdominal tenderness.  Musculoskeletal:        General: No tenderness.     Cervical back: Normal range of motion and neck supple.  Skin:    General: Skin is warm and dry.     Findings: No erythema or rash.  Neurological:     Mental Status: He is alert and oriented to person, place, and time.     ED Results and Treatments Labs (all labs ordered are listed, but only abnormal results are displayed) Labs Reviewed  URINALYSIS, ROUTINE W REFLEX MICROSCOPIC - Abnormal; Notable for the following components:      Result Value   APPearance CLOUDY (*)    Ketones, ur 5 (*)    Protein, ur 30 (*)    Nitrite POSITIVE (*)    Leukocytes,Ua LARGE (*)    WBC, UA >50 (*)    Bacteria,  UA MANY (*)    All other components within normal limits  CBC WITH DIFFERENTIAL/PLATELET - Abnormal; Notable for the following components:   WBC 14.8 (*)    Neutro Abs 10.5 (*)    Abs Immature Granulocytes 0.08 (*)    All other components within normal limits  COMPREHENSIVE METABOLIC PANEL - Abnormal; Notable for the following components:   Potassium 3.1 (*)    Glucose, Bld 114 (*)    Calcium 8.8 (*)    Albumin 3.2 (*)    Alkaline Phosphatase 198 (*)    All other components within  normal limits  D-DIMER, QUANTITATIVE (NOT AT [facility]) - Abnormal; Notable for the following components:   D-Dimer, Quant 2.49 (*)    All other components within normal limits  TROPONIN I (HIGH SENSITIVITY) - Abnormal; Notable for the following components:   Troponin I (High Sensitivity) 367 (*)    All other components within normal limits  TROPONIN I (HIGH SENSITIVITY) - Abnormal; Notable for the following components:   Troponin I (High Sensitivity) 1,224 (*)    All other components within normal limits  SARS CORONAVIRUS 2 (TAT 6-24 HRS)  CULTURE, BLOOD (SINGLE)  URINE CULTURE  RESP PANEL BY RT-PCR (FLU A&B, COVID) ARPGX2  CULTURE, BLOOD (SINGLE)  MRSA PCR SCREENING  LACTIC ACID, PLASMA  LACTIC ACID, PLASMA  PROTIME-INR  APTT  CBC WITH DIFFERENTIAL/PLATELET  HEPARIN LEVEL (UNFRACTIONATED)  BRAIN NATRIURETIC PEPTIDE  BASIC METABOLIC PANEL  LIPID PANEL  CBC  I-STAT CHEM 8, ED  I-STAT VENOUS BLOOD GAS, ED                                                                                                                         EKG  EKG Interpretation  Date/Time:  Saturday Jul 03 2020 22:54:08 EDT Ventricular Rate:  83 PR Interval:  140 QRS Duration: 108 QT Interval:  421 QTC Calculation: 495 R Axis:   79 Text Interpretation: Sinus rhythm Left atrial enlargement Nonspecific repol abnormality, diffuse leads Baseline wander in lead(s) V5 Confirmed by Addison Lank [id number]) on 07/03/2020 11:10:19 PM       Radiology CT ANGIO CHEST PE W OR WO CONTRAST  Result Date: 07/04/2020 CLINICAL DATA:  Shortness of breath after vomiting EXAM: CT ANGIOGRAPHY CHEST WITH CONTRAST TECHNIQUE: Multidetector CT imaging of the chest was performed using the standard protocol during bolus administration of intravenous contrast. Multiplanar CT image reconstructions and MIPs were obtained to evaluate the vascular anatomy. CONTRAST:  36mL OMNIPAQUE IOHEXOL 350 MG/ML SOLN COMPARISON:  07/03/2020 FINDINGS: Cardiovascular: This is a technically adequate evaluation of the pulmonary vasculature. No filling defects or pulmonary emboli. No pericardial effusion. Moderate atherosclerosis throughout the coronary vasculature greatest in the LAD distribution. There is aneurysmal dilatation of the ascending thoracic aorta measuring up to 4.4 cm in diameter. There is moderate diffuse aortic atherosclerosis. Mediastinum/Nodes: Mediastinal adenopathy identified, measuring up to 14 mm in short axis in the AP window. Thyroid, trachea, and esophagus are grossly normal. Lungs/Pleura: There are bilateral pleural effusions, less than 1 L each. Bilateral perihilar ground-glass airspace disease is identified, with basilar predominant interlobular septal thickening. Dependent lower lobe atelectasis. No pneumothorax. The central airways are patent. Upper Abdomen: Small calcifications are seen at the bilateral renal hila, likely vascular. No acute upper abdominal findings. Musculoskeletal: No acute or destructive bony lesions. Reconstructed images demonstrate no additional findings. Review of the MIP images confirms the above findings. IMPRESSION: 1. No evidence of pulmonary embolus. 2. Bilateral perihilar ground-glass airspace disease, basilar predominant interlobular septal thickening, and bilateral pleural effusions as above. Favor fluid overload  over infection. 3. Nonspecific mediastinal lymphadenopathy, which may be reactive. 4. 4.4 cm ascending thoracic  aortic aneurysm. Recommend annual imaging followup by CTA or MRA. This recommendation follows 2010 ACCF/AHA/AATS/ACR/ASA/SCA/SCAI/SIR/STS/SVM Guidelines for the Diagnosis and Management of Patients with Thoracic Aortic Disease. Circulation. 2010; 121: J941-D408. Aortic aneurysm NOS (ICD10-I71.9) 5.  Aortic Atherosclerosis (ICD10-I70.0). Electronically Signed   By: Randa Ngo M.D.   On: 07/04/2020 03:17   DG Chest Port 1 View  Result Date: 07/04/2020 CLINICAL DATA:  Sepsis, vomiting, short of breath EXAM: PORTABLE CHEST 1 VIEW COMPARISON:  11/27/2014 FINDINGS: 2 frontal views of the chest demonstrate an unremarkable cardiac silhouette. There is diffuse increased interstitial prominence, with bilateral perihilar airspace disease greatest in the lower lung zones. No effusion or pneumothorax. No acute bony abnormalities. IMPRESSION: 1. Diffuse interstitial prominence with basilar predominant perihilar consolidation. Appearance could reflect multifocal pneumonia, aspiration, or edema. Electronically Signed   By: Randa Ngo M.D.   On: 07/04/2020 00:05    Pertinent labs & imaging results that were available during my care of the patient were reviewed by me and considered in my medical decision making (see chart for details).  Medications Ordered in ED Medications  heparin ADULT infusion 100 units/mL (25000 units/284mL) (1,150 Units/hr Intravenous New Bag/Given 07/04/20 0301)  amLODipine (NORVASC) tablet 5 mg (has no administration in time range)  celecoxib (CELEBREX) capsule 200 mg (has no administration in time range)  atenolol (TENORMIN) tablet 25 mg (has no administration in time range)  hydrochlorothiazide (HYDRODIURIL) tablet 25 mg (has no administration in time range)  simvastatin (ZOCOR) tablet 40 mg (has no administration in time range)  buPROPion (WELLBUTRIN SR) 12 hr tablet 100 mg (has no administration in time range)  pantoprazole (PROTONIX) EC tablet 40 mg (has no administration in time  range)  tamsulosin (FLOMAX) capsule 0.4 mg (has no administration in time range)  albuterol (PROVENTIL) (2.5 MG/3ML) 0.083% nebulizer solution 2.5 mg (has no administration in time range)  aspirin chewable tablet 324 mg (has no administration in time range)    Or  aspirin suppository 300 mg (has no administration in time range)  aspirin EC tablet 81 mg (has no administration in time range)  nitroGLYCERIN (NITROSTAT) SL tablet 0.4 mg (has no administration in time range)  acetaminophen (TYLENOL) tablet 650 mg (has no administration in time range)  cefTRIAXone (ROCEPHIN) 2 g in sodium chloride 0.9 % 100 mL IVPB (has no administration in time range)  doxycycline (VIBRA-TABS) tablet 100 mg (has no administration in time range)  zolpidem (AMBIEN) tablet 5 mg (has no administration in time range)  umeclidinium bromide (INCRUSE ELLIPTA) 62.5 MCG/INH 1 puff (has no administration in time range)  furosemide (LASIX) injection 40 mg (40 mg Intravenous Given 07/04/20 0453)  LORazepam (ATIVAN) tablet 0.5 mg (has no administration in time range)  cefTRIAXone (ROCEPHIN) 1 g in sodium chloride 0.9 % 100 mL IVPB (0 g Intravenous Stopped 07/04/20 0255)  azithromycin (ZITHROMAX) 500 mg in sodium chloride 0.9 % 250 mL IVPB (500 mg Intravenous New Bag/Given 07/04/20 0156)  heparin bolus via infusion 4,000 Units (4,000 Units Intravenous Bolus from Bag 07/04/20 0301)  iohexol (OMNIPAQUE) 350 MG/ML injection 50 mL (50 mLs Intravenous Contrast Given 07/04/20 0246)  Procedures .1-3 Lead EKG Interpretation Performed by: Fatima Blank, MD Authorized by: Fatima Blank, MD     Interpretation: normal     ECG rate:  90   ECG rate assessment: normal     Rhythm: sinus rhythm     Ectopy: none     Conduction: normal   .Critical Care Performed by: Fatima Blank,  MD Authorized by: Fatima Blank, MD   Critical care provider statement:    Critical care time (minutes):  45   Critical care was necessary to treat or prevent imminent or life-threatening deterioration of the following conditions:  Cardiac failure   Critical care was time spent personally by me on the following activities:  Discussions with consultants, evaluation of patient's response to treatment, examination of patient, ordering and performing treatments and interventions, ordering and review of laboratory studies, ordering and review of radiographic studies, pulse oximetry, re-evaluation of patient's condition, obtaining history from patient or surrogate and review of old charts    (including critical care time)  Medical Decision Making / ED Course I have reviewed the nursing notes for this encounter and the patient's prior records (if available in EHR or on provided paperwork).   Hersel Mcmeen Schult was evaluated in Emergency Department on 07/04/2020 for the symptoms described in the history of present illness. He was evaluated in the context of the global COVID-19 pandemic, which necessitated consideration that the patient might be at risk for infection with the SARS-CoV-2 virus that causes COVID-19. Institutional protocols and algorithms that pertain to the evaluation of patients at risk for COVID-19 are in a state of rapid change based on information released by regulatory bodies including the CDC and federal and state organizations. These policies and algorithms were followed during the patient's care in the ED.  Sudden onset shortness of breath. Improved with oxygen and breathing treatment. Possible COPD exacerbation, pneumonia, PE, ACS. Patient does not appear to be volume overloaded on exam.  EKG with flattening of the T waves in the lateral leads when compared to EKG from 2015. Chest x-ray notable for diffuse opacities.  Pneumonia versus pulmonary edema. Given his lack of  cardiac history favoring pneumonia at this time. Patient is white count also elevated but not concerning for leukostasis. Given empiric Rocephin and azithromycin for community-acquired pneumonia. We will obtain a COVID/influenza.  Care delayed due to delayed and lab resulting of his chemistry and troponin panels. Patient's troponin was elevated at 300. His dimer also elevated.  This could be related to possible PE versus pneumonia versus ACS. Patient started on heparin.  UA noteable for infection.  Admitted to medicine for further work-up and management. CT ordered to better characterize lung markings and rule out PE.  CTA negative for PE. Opacities are more consistent with pulmonary edema and less likely pneumonia. Patient's repeated troponin uptrending making NSTEMI the more likely cause of his symptoms.        Final Clinical Impression(s) / ED Diagnoses Final diagnoses:  NSTEMI (non-ST elevated myocardial infarction) [facility])      This chart was dictated using voice recognition software.  Despite best efforts to proofread,  errors can occur which can change the documentation meaning.   Fatima Blank, MD 07/04/20 [phone]

## 2020-07-03 NOTE — ED Triage Notes (Signed)
Pt arrived via EMS from home. Per EMS, pt's family reports pt had "vomited thick brown mucus and then began c/o SOB." Pt alert on arrival to ED on O2 via NRB. EMS reports given pt albuterol neb x2 (2.5 mg each), 125 mg solu-medrol IV, and 2 g mag IV with improvement in pt's respiratory distress. Hx artificial esophagus and leukemia.

## 2020-07-04 ENCOUNTER — Other Ambulatory Visit: Payer: Self-pay

## 2020-07-04 ENCOUNTER — Emergency Department (HOSPITAL_COMMUNITY): Payer: Medicare Other

## 2020-07-04 ENCOUNTER — Inpatient Hospital Stay (HOSPITAL_COMMUNITY): Payer: Medicare Other

## 2020-07-04 ENCOUNTER — Encounter (HOSPITAL_COMMUNITY): Payer: Self-pay | Admitting: Family Medicine

## 2020-07-04 DIAGNOSIS — J90 Pleural effusion, not elsewhere classified: Secondary | ICD-10-CM | POA: Diagnosis not present

## 2020-07-04 DIAGNOSIS — R7989 Other specified abnormal findings of blood chemistry: Secondary | ICD-10-CM | POA: Diagnosis present

## 2020-07-04 DIAGNOSIS — I10 Essential (primary) hypertension: Secondary | ICD-10-CM | POA: Diagnosis present

## 2020-07-04 DIAGNOSIS — Z20822 Contact with and (suspected) exposure to covid-19: Secondary | ICD-10-CM | POA: Diagnosis present

## 2020-07-04 DIAGNOSIS — K219 Gastro-esophageal reflux disease without esophagitis: Secondary | ICD-10-CM | POA: Diagnosis present

## 2020-07-04 DIAGNOSIS — F17200 Nicotine dependence, unspecified, uncomplicated: Secondary | ICD-10-CM | POA: Diagnosis present

## 2020-07-04 DIAGNOSIS — J9811 Atelectasis: Secondary | ICD-10-CM | POA: Diagnosis not present

## 2020-07-04 DIAGNOSIS — J189 Pneumonia, unspecified organism: Secondary | ICD-10-CM | POA: Diagnosis present

## 2020-07-04 DIAGNOSIS — I214 Non-ST elevation (NSTEMI) myocardial infarction: Secondary | ICD-10-CM | POA: Diagnosis not present

## 2020-07-04 DIAGNOSIS — R9431 Abnormal electrocardiogram [ECG] [EKG]: Secondary | ICD-10-CM | POA: Insufficient documentation

## 2020-07-04 DIAGNOSIS — E785 Hyperlipidemia, unspecified: Secondary | ICD-10-CM | POA: Diagnosis present

## 2020-07-04 DIAGNOSIS — Z79899 Other long term (current) drug therapy: Secondary | ICD-10-CM | POA: Diagnosis not present

## 2020-07-04 DIAGNOSIS — I5031 Acute diastolic (congestive) heart failure: Secondary | ICD-10-CM | POA: Diagnosis not present

## 2020-07-04 DIAGNOSIS — E7800 Pure hypercholesterolemia, unspecified: Secondary | ICD-10-CM | POA: Diagnosis present

## 2020-07-04 DIAGNOSIS — I110 Hypertensive heart disease with heart failure: Secondary | ICD-10-CM | POA: Diagnosis present

## 2020-07-04 DIAGNOSIS — I712 Thoracic aortic aneurysm, without rupture: Secondary | ICD-10-CM | POA: Diagnosis present

## 2020-07-04 DIAGNOSIS — Z01810 Encounter for preprocedural cardiovascular examination: Secondary | ICD-10-CM | POA: Diagnosis not present

## 2020-07-04 DIAGNOSIS — R001 Bradycardia, unspecified: Secondary | ICD-10-CM | POA: Diagnosis not present

## 2020-07-04 DIAGNOSIS — I25110 Atherosclerotic heart disease of native coronary artery with unstable angina pectoris: Secondary | ICD-10-CM | POA: Diagnosis not present

## 2020-07-04 DIAGNOSIS — I959 Hypotension, unspecified: Secondary | ICD-10-CM | POA: Diagnosis not present

## 2020-07-04 DIAGNOSIS — C9590 Leukemia, unspecified not having achieved remission: Secondary | ICD-10-CM | POA: Diagnosis present

## 2020-07-04 DIAGNOSIS — J449 Chronic obstructive pulmonary disease, unspecified: Secondary | ICD-10-CM | POA: Diagnosis present

## 2020-07-04 DIAGNOSIS — R0602 Shortness of breath: Secondary | ICD-10-CM | POA: Diagnosis not present

## 2020-07-04 DIAGNOSIS — J9601 Acute respiratory failure with hypoxia: Secondary | ICD-10-CM | POA: Diagnosis not present

## 2020-07-04 DIAGNOSIS — I455 Other specified heart block: Secondary | ICD-10-CM | POA: Diagnosis not present

## 2020-07-04 DIAGNOSIS — D72829 Elevated white blood cell count, unspecified: Secondary | ICD-10-CM | POA: Diagnosis present

## 2020-07-04 DIAGNOSIS — D62 Acute posthemorrhagic anemia: Secondary | ICD-10-CM | POA: Diagnosis not present

## 2020-07-04 DIAGNOSIS — E876 Hypokalemia: Secondary | ICD-10-CM | POA: Diagnosis present

## 2020-07-04 DIAGNOSIS — I2510 Atherosclerotic heart disease of native coronary artery without angina pectoris: Secondary | ICD-10-CM | POA: Diagnosis not present

## 2020-07-04 LAB — URINALYSIS, ROUTINE W REFLEX MICROSCOPIC
Bilirubin Urine: NEGATIVE
Glucose, UA: NEGATIVE mg/dL
Hgb urine dipstick: NEGATIVE
Ketones, ur: 5 mg/dL — AB
Nitrite: POSITIVE — AB
Protein, ur: 30 mg/dL — AB
Specific Gravity, Urine: 1.019 (ref 1.005–1.030)
WBC, UA: >50 WBC/hpf — ABNORMAL HIGH (ref 0–5)
pH: 5 (ref 5.0–8.0)

## 2020-07-04 LAB — HEPARIN LEVEL (UNFRACTIONATED)
Heparin Unfractionated: 0.14 IU/mL — ABNORMAL LOW (ref 0.30–0.70)
Heparin Unfractionated: 0.18 IU/mL — ABNORMAL LOW (ref 0.30–0.70)

## 2020-07-04 LAB — LIPID PANEL
Cholesterol: 170 mg/dL (ref 0–200)
HDL: 51 mg/dL (ref >40)
LDL Cholesterol: 112 mg/dL — ABNORMAL HIGH (ref 0–99)
Total CHOL/HDL Ratio: 3.3 RATIO
Triglycerides: 35 mg/dL (ref <150)
VLDL: 7 mg/dL (ref 0–40)

## 2020-07-04 LAB — COMPREHENSIVE METABOLIC PANEL
ALT: 24 U/L (ref 0–44)
AST: 24 U/L (ref 15–41)
Albumin: 3.2 g/dL — ABNORMAL LOW (ref 3.5–5.0)
Alkaline Phosphatase: 198 U/L — ABNORMAL HIGH (ref 38–126)
Anion gap: 10 (ref 5–15)
BUN: 15 mg/dL (ref 8–23)
CO2: 28 mmol/L (ref 22–32)
Calcium: 8.8 mg/dL — ABNORMAL LOW (ref 8.9–10.3)
Chloride: 103 mmol/L (ref 98–111)
Creatinine, Ser: 1 mg/dL (ref 0.61–1.24)
GFR, Estimated: >60 mL/min (ref >60)
Glucose, Bld: 114 mg/dL — ABNORMAL HIGH (ref 70–99)
Potassium: 3.1 mmol/L — ABNORMAL LOW (ref 3.5–5.1)
Sodium: 141 mmol/L (ref 135–145)
Total Bilirubin: 0.5 mg/dL (ref 0.3–1.2)
Total Protein: 7 g/dL (ref 6.5–8.1)

## 2020-07-04 LAB — ECHOCARDIOGRAM COMPLETE
Area-P 1/2: 5.13 cm2
Height: 70 in
S' Lateral: 3.8 cm
Weight: 2977.09 oz

## 2020-07-04 LAB — SARS CORONAVIRUS 2 (TAT 6-24 HRS): SARS Coronavirus 2: NEGATIVE

## 2020-07-04 LAB — CBC
HCT: 36.3 % — ABNORMAL LOW (ref 39.0–52.0)
Hemoglobin: 11.8 g/dL — ABNORMAL LOW (ref 13.0–17.0)
MCH: 29.1 pg (ref 26.0–34.0)
MCHC: 32.5 g/dL (ref 30.0–36.0)
MCV: 89.4 fL (ref 80.0–100.0)
Platelets: 278 10*3/uL (ref 150–400)
RBC: 4.06 MIL/uL — ABNORMAL LOW (ref 4.22–5.81)
RDW: 14.5 % (ref 11.5–15.5)
WBC: 12 10*3/uL — ABNORMAL HIGH (ref 4.0–10.5)
nRBC: 0 % (ref 0.0–0.2)

## 2020-07-04 LAB — BASIC METABOLIC PANEL
Anion gap: 9 (ref 5–15)
BUN: 14 mg/dL (ref 8–23)
CO2: 24 mmol/L (ref 22–32)
Calcium: 8.2 mg/dL — ABNORMAL LOW (ref 8.9–10.3)
Chloride: 105 mmol/L (ref 98–111)
Creatinine, Ser: 0.93 mg/dL (ref 0.61–1.24)
GFR, Estimated: >60 mL/min (ref >60)
Glucose, Bld: 171 mg/dL — ABNORMAL HIGH (ref 70–99)
Potassium: 3.5 mmol/L (ref 3.5–5.1)
Sodium: 138 mmol/L (ref 135–145)

## 2020-07-04 LAB — TROPONIN I (HIGH SENSITIVITY)
Troponin I (High Sensitivity): 367 ng/L (ref <18)
Troponin I (High Sensitivity): 1224 ng/L (ref <18)
Troponin I (High Sensitivity): 2594 ng/L (ref <18)
Troponin I (High Sensitivity): 2193 ng/L (ref <18)

## 2020-07-04 LAB — BRAIN NATRIURETIC PEPTIDE: B Natriuretic Peptide: 1155.9 pg/mL — ABNORMAL HIGH (ref 0.0–100.0)

## 2020-07-04 LAB — LACTIC ACID, PLASMA
Lactic Acid, Venous: 1.9 mmol/L (ref 0.5–1.9)
Lactic Acid, Venous: 1.3 mmol/L (ref 0.5–1.9)

## 2020-07-04 LAB — PROTIME-INR
INR: 1 (ref 0.8–1.2)
Prothrombin Time: 13.5 seconds (ref 11.4–15.2)

## 2020-07-04 LAB — MRSA PCR SCREENING: MRSA by PCR: NEGATIVE

## 2020-07-04 LAB — PROCALCITONIN: Procalcitonin: <0.1 ng/mL

## 2020-07-04 LAB — APTT: aPTT: 29 seconds (ref 24–36)

## 2020-07-04 LAB — D-DIMER, QUANTITATIVE: D-Dimer, Quant: 2.49 ug/mL-FEU — ABNORMAL HIGH (ref 0.00–0.50)

## 2020-07-04 MED ORDER — TAMSULOSIN HCL 0.4 MG PO CAPS
0.4000 mg | ORAL_CAPSULE | Freq: Every day | ORAL | Status: DC
  Administered 2020-07-04 – 2020-07-13 (×9): 0.4 mg via ORAL
  Filled 2020-07-04 (×9): qty 1

## 2020-07-04 MED ORDER — PANTOPRAZOLE SODIUM 40 MG PO TBEC
40.0000 mg | DELAYED_RELEASE_TABLET | Freq: Every day | ORAL | Status: DC
  Administered 2020-07-04 – 2020-07-07 (×4): 40 mg via ORAL
  Filled 2020-07-04 (×4): qty 1

## 2020-07-04 MED ORDER — ALBUTEROL SULFATE (2.5 MG/3ML) 0.083% IN NEBU: 2.5000 mg | INHALATION_SOLUTION | RESPIRATORY_TRACT | Status: DC | PRN

## 2020-07-04 MED ORDER — HEPARIN BOLUS VIA INFUSION
4000.0000 [IU] | Freq: Once | INTRAVENOUS | Status: AC
  Administered 2020-07-04: 4000 [IU] via INTRAVENOUS
  Filled 2020-07-04: qty 4000

## 2020-07-04 MED ORDER — ASPIRIN 300 MG RE SUPP
300.0000 mg | RECTAL | Status: AC
  Filled 2020-07-04: qty 1

## 2020-07-04 MED ORDER — HEPARIN BOLUS VIA INFUSION
2000.0000 [IU] | Freq: Once | INTRAVENOUS | Status: AC
  Administered 2020-07-04: 2000 [IU] via INTRAVENOUS
  Filled 2020-07-04: qty 2000

## 2020-07-04 MED ORDER — SODIUM CHLORIDE 0.9 % IV SOLN
500.0000 mg | Freq: Once | INTRAVENOUS | Status: AC
  Administered 2020-07-04: 500 mg via INTRAVENOUS
  Filled 2020-07-04: qty 500

## 2020-07-04 MED ORDER — SIMVASTATIN 20 MG PO TABS: 40.0000 mg | ORAL_TABLET | Freq: Every day | ORAL | Status: DC

## 2020-07-04 MED ORDER — AMLODIPINE BESYLATE 5 MG PO TABS: 5.0000 mg | ORAL_TABLET | Freq: Every day | ORAL | Status: DC

## 2020-07-04 MED ORDER — BUPROPION HCL ER (SR) 100 MG PO TB12
100.0000 mg | ORAL_TABLET | Freq: Two times a day (BID) | ORAL | Status: DC
  Administered 2020-07-04 – 2020-07-13 (×17): 100 mg via ORAL
  Filled 2020-07-04 (×20): qty 1

## 2020-07-04 MED ORDER — HYDROCHLOROTHIAZIDE 25 MG PO TABS: 25.0000 mg | ORAL_TABLET | Freq: Every day | ORAL | Status: DC

## 2020-07-04 MED ORDER — CELECOXIB 200 MG PO CAPS
200.0000 mg | ORAL_CAPSULE | Freq: Every day | ORAL | Status: DC
  Filled 2020-07-04: qty 1

## 2020-07-04 MED ORDER — ASPIRIN EC 81 MG PO TBEC
81.0000 mg | DELAYED_RELEASE_TABLET | Freq: Every day | ORAL | Status: DC
  Administered 2020-07-05 – 2020-07-07 (×3): 81 mg via ORAL
  Filled 2020-07-04 (×3): qty 1

## 2020-07-04 MED ORDER — POTASSIUM CHLORIDE CRYS ER 20 MEQ PO TBCR
40.0000 meq | EXTENDED_RELEASE_TABLET | Freq: Two times a day (BID) | ORAL | Status: AC
  Administered 2020-07-04 (×2): 40 meq via ORAL
  Filled 2020-07-04 (×2): qty 2

## 2020-07-04 MED ORDER — UMECLIDINIUM BROMIDE 62.5 MCG/INH IN AEPB
1.0000 | INHALATION_SPRAY | Freq: Every day | RESPIRATORY_TRACT | Status: DC
  Administered 2020-07-04 – 2020-07-11 (×5): 1 via RESPIRATORY_TRACT
  Filled 2020-07-04 (×2): qty 7

## 2020-07-04 MED ORDER — LORAZEPAM 0.5 MG PO TABS
0.5000 mg | ORAL_TABLET | Freq: Every evening | ORAL | Status: DC | PRN
  Administered 2020-07-04 – 2020-07-05 (×3): 0.5 mg via ORAL
  Filled 2020-07-04 (×3): qty 1

## 2020-07-04 MED ORDER — ATENOLOL 25 MG PO TABS
25.0000 mg | ORAL_TABLET | Freq: Every day | ORAL | Status: DC
  Filled 2020-07-04: qty 1

## 2020-07-04 MED ORDER — ASPIRIN 81 MG PO CHEW
324.0000 mg | CHEWABLE_TABLET | ORAL | Status: AC
  Administered 2020-07-04: 324 mg via ORAL
  Filled 2020-07-04: qty 4

## 2020-07-04 MED ORDER — FUROSEMIDE 10 MG/ML IJ SOLN
40.0000 mg | Freq: Two times a day (BID) | INTRAMUSCULAR | Status: AC
  Administered 2020-07-04 – 2020-07-05 (×3): 40 mg via INTRAVENOUS
  Filled 2020-07-04 (×3): qty 4

## 2020-07-04 MED ORDER — SODIUM CHLORIDE 0.9 % IV SOLN
1.0000 g | Freq: Once | INTRAVENOUS | Status: AC
  Administered 2020-07-04: 1 g via INTRAVENOUS
  Filled 2020-07-04: qty 10

## 2020-07-04 MED ORDER — IOHEXOL 350 MG/ML SOLN
50.0000 mL | Freq: Once | INTRAVENOUS | Status: AC | PRN
  Administered 2020-07-04: 50 mL via INTRAVENOUS

## 2020-07-04 MED ORDER — ACETAMINOPHEN 325 MG PO TABS: 650.0000 mg | ORAL_TABLET | ORAL | Status: DC | PRN

## 2020-07-04 MED ORDER — DOXYCYCLINE HYCLATE 100 MG PO TABS: 100.0000 mg | ORAL_TABLET | Freq: Two times a day (BID) | ORAL | Status: DC

## 2020-07-04 MED ORDER — HEPARIN (PORCINE) 25000 UT/250ML-% IV SOLN
1600.0000 [IU]/h | INTRAVENOUS | Status: DC
  Administered 2020-07-04: 1150 [IU]/h via INTRAVENOUS
  Administered 2020-07-04: 1400 [IU]/h via INTRAVENOUS
  Administered 2020-07-05 – 2020-07-06 (×2): 1600 [IU]/h via INTRAVENOUS
  Filled 2020-07-04 (×4): qty 250

## 2020-07-04 MED ORDER — ZOLPIDEM TARTRATE 5 MG PO TABS: 5.0000 mg | ORAL_TABLET | Freq: Every evening | ORAL | Status: DC | PRN

## 2020-07-04 MED ORDER — SODIUM CHLORIDE 0.9 % IV SOLN
2.0000 g | INTRAVENOUS | Status: DC
  Filled 2020-07-04: qty 20

## 2020-07-04 MED ORDER — SODIUM CHLORIDE 0.9 % IV SOLN: INTRAVENOUS | Status: DC

## 2020-07-04 MED ORDER — CARVEDILOL 12.5 MG PO TABS
12.5000 mg | ORAL_TABLET | Freq: Two times a day (BID) | ORAL | Status: DC
  Administered 2020-07-04 – 2020-07-06 (×6): 12.5 mg via ORAL
  Filled 2020-07-04 (×6): qty 1

## 2020-07-04 MED ORDER — ATORVASTATIN CALCIUM 80 MG PO TABS
80.0000 mg | ORAL_TABLET | Freq: Every day | ORAL | Status: DC
  Administered 2020-07-04 – 2020-07-12 (×9): 80 mg via ORAL
  Filled 2020-07-04 (×9): qty 1

## 2020-07-04 MED ORDER — NITROGLYCERIN 0.4 MG SL SUBL: 0.4000 mg | SUBLINGUAL_TABLET | SUBLINGUAL | Status: DC | PRN

## 2020-07-04 NOTE — ED Notes (Signed)
Attempted to call report, registration advised they need to make sure the charge nurse is aware and took down callback number.

## 2020-07-04 NOTE — Plan of Care (Signed)

## 2020-07-04 NOTE — Progress Notes (Signed)
ANTICOAGULATION CONSULT NOTE - Initial Consult  Pharmacy Consult for heparin Indication: chest pain/ACS  Allergies  Allergen Reactions  . Oxycodone Nausea And Vomiting  . Pravastatin Itching    Other reaction(s): Xerostomia, Itching    Patient Measurements: Height: 5\' 11"  (180.3 cm) Weight: 83.9 kg (185 lb) IBW/kg (Calculated) : 75.3 Heparin Dosing Weight: 83.9 kg Vital Signs: Temp: 98.3 F (36.8 C) (05/29 0145) Temp Source: Oral (05/29 0145) BP: 158/85 (05/29 0230) Pulse Rate: 94 (05/29 0230)  Labs: Recent Labs    07/03/20 2346  HGB 13.7  HCT 43.1  PLT 335  APTT 29  LABPROT 13.5  INR 1.0  CREATININE 1.00  TROPONINIHS 367*    Estimated Creatinine Clearance: 70.1 mL/min (by C-G formula based on SCr of 1 mg/dL).   Medical History: Past Medical History:  Diagnosis Date  . COPD (chronic obstructive pulmonary disease) ([place])   . Hypercholesteremia   . Hypertension     Medications:  See medication history  Assessment: 75 yo man to start heparin for CP.  He was not on anticoagulation PTA.  Hg 13.7, PTLC 335 Goal of Therapy:  Heparin level 0.3-0.7 units/ml Monitor platelets by anticoagulation protocol: Yes   Plan:  Heparin 4000 unit bolus and drip at 1150 units/hr Check heparin level 6-8 hours after start. Daily HL and CBC while on heparin Monitor for bleeding complications  Ygnacio Fecteau P[place]et 07/04/2020,2:37 AM

## 2020-07-04 NOTE — H&P (Signed)
History and Physical    Blake Sutton WJX:[id number] DOB: [birth year] DOA: 07/03/2020  PCP: Pcp, No   Patient coming from:  Home  Chief Complaint:  Chest pain, SOB  HPI: Blake Sutton is a 75 y.o. male with medical history significant for HTN, HLD, COPD, presents by EMS with complaint of Chest pain and SOB. Was at home when developed central chest pain and pressure. States it felt like a rock on his sternum and was associated with SOB and nausea. He did not have a cough or fever. He states he has not had swelling of his legs. He has no history of blood clots. States he has not had prolonged immobilization. Denies history of CHF. Denies hx of CAD or cardiac intervention in past.  Continues to smoke occasionally.   ED Course: In the ER pt is hemodynamically stable. He has been in 11.  D-dimer is elevated 2.49.  WBC 14,800.  Hemoglobin 13.7 hematocrit 43.1 with a platelet of 335.  Electrolytes and renal function normal.  LFTs normal.  hospitalist Service asked to admit for further management  Review of Systems:  General: Denies fever, chills, weight loss, night sweats.  Denies dizziness. Denies change in appetite HENT: Denies head trauma, headache, denies change in hearing, tinnitus.  Denies nasal congestion or bleeding.  Denies sore throat, sores in mouth.  Denies difficulty swallowing Eyes: Denies blurry vision, pain in eye, drainage.  Denies discoloration of eyes. Neck: Denies pain.  Denies swelling.  Denies pain with movement. Cardiovascular: Reports chest pressure. Denies palpitations.  Denies edema.  Denies orthopnea Respiratory: Reports shortness of breath. Denies cough.  Denies wheezing.  Denies sputum production Gastrointestinal: Denies abdominal pain, swelling. Reports mild nausea but no vomiting, diarrhea.  Denies melena.  Denies hematemesis. Musculoskeletal: Denies limitation of movement. Denies deformity or swelling. Denies pain. Denies arthralgias or myalgias. Genitourinary:  Denies pelvic pain.  Denies urinary frequency or hesitancy.  Denies dysuria.  Skin: Denies rash.  Denies petechiae, purpura, ecchymosis. Neurological: Denies headache.  Denies syncope.  Denies seizure activity.  Denies paresthesia.  Denies slurred speech, drooping face.  Denies visual change. Psychiatric: Denies depression, anxiety. Denies hallucinations.  Past Medical History:  Diagnosis Date  . COPD (chronic obstructive pulmonary disease) ([place])   . Hypercholesteremia   . Hypertension     Past Surgical History:  Procedure Laterality Date  . ABDOMINAL SURGERY     "repair bleeding ulcers"  . NECK SURGERY    . ROTATOR CUFF REPAIR      Social History  reports that he has been smoking. He does not have any smokeless tobacco history on file. He reports that he does not drink alcohol and does not use drugs.  Allergies  Allergen Reactions  . Oxycodone Nausea And Vomiting  . Pravastatin Itching    Other reaction(s): Xerostomia, Itching    History reviewed. No pertinent family history.   Prior to Admission medications   Medication Sig Start Date End Date Taking? Authorizing Provider  albuterol (PROVENTIL HFA;VENTOLIN HFA) 108 (90 BASE) MCG/ACT inhaler Inhale 1-2 puffs into the lungs every 6 (six) hours as needed for wheezing or shortness of breath.   Yes [provider]  amLODipine (NORVASC) 5 MG tablet Take 5 mg by mouth daily. 10/06/19  Yes [provider]  atenolol (TENORMIN) 25 MG tablet Take 25 mg by mouth daily.   Yes [provider]  buPROPion (WELLBUTRIN SR) 100 MG 12 hr tablet Take 100 mg by mouth 2 (two) times daily. 10/06/19  Yes [provider]  celecoxib (CELEBREX) 200 MG capsule Take 200 mg by mouth daily.  06/25/13  Yes [provider]  FLUoxetine (PROZAC) 20 MG capsule Take 20 mg by mouth daily.   Yes [provider]  hydrochlorothiazide (HYDRODIURIL) 25 MG tablet Take 25 mg by mouth daily.   Yes [provider]  HYDROcodone-acetaminophen (NORCO/VICODIN) 5-325 MG per tablet Take 1 tablet by mouth every 6 (six) hours as needed (pain).  06/25/13  Yes [provider]  meloxicam (MOBIC) 15 MG tablet Take 15 mg by mouth daily as needed for pain. 10/06/19  Yes [provider]  omeprazole (PRILOSEC) 20 MG capsule Take 20 mg by mouth daily.   Yes [provider]  sildenafil (VIAGRA) 100 MG tablet Take 100 mg by mouth as needed for erectile dysfunction. 10/06/19  Yes [provider]  simvastatin (ZOCOR) 40 MG tablet Take 40 mg by mouth at bedtime.   Yes [provider]  tamsulosin (FLOMAX) 0.4 MG CAPS capsule Take 0.4 mg by mouth daily. 10/06/19  Yes [provider]  traZODone (DESYREL) 50 MG tablet Take 50 mg by mouth at bedtime.   Yes [provider]    Physical Exam: Vitals:   07/04/20 0145 07/04/20 0200 07/04/20 0215 07/04/20 0230  BP: (!) 148/95 (!) 142/110 (!) 139/116 (!) 158/85  Pulse: 89 90 91 94  Resp: (!) 27 (!) 26 20 16   Temp: 98.3 F (36.8 C)     TempSrc: Oral     SpO2: 94% 94% 93% 92%  Weight:      Height:        Constitutional: NAD, calm, comfortable Vitals:   07/04/20 0145 07/04/20 0200 07/04/20 0215 07/04/20 0230  BP: (!) 148/95 (!) 142/110 (!) 139/116 (!) 158/85  Pulse: 89 90 91 94  Resp: (!) 27 (!) 26 20 16   Temp: 98.3 F (36.8 C)     TempSrc: Oral     SpO2: 94% 94% 93% 92%  Weight:      Height:       General: WDWN, Alert and oriented x3.  Eyes: EOMI, PERRL, conjunctivae normal.  Sclera nonicteric HENT:  [place]/AT, external ears normal.  Nares patent without epistasis.  Mucous membranes are moist. Neck: Soft, normal range of motion, supple, no masses, no thyromegaly. Trachea midline Respiratory: Diminished but equal breath sounds with mild rales bilaterally. no wheezing, no crackles. Normal respiratory effort. No accessory muscle use.  Cardiovascular: Regular rate and rhythm, no murmurs / rubs / gallops. No extremity  edema. 2+ pedal pulses. Abdomen: Soft, no tenderness, nondistended, no rebound or guarding.  No masses palpated. Bowel sounds normoactive Musculoskeletal: FROM. no cyanosis. No joint deformity upper and lower extremities. Healed surgical scars on right shoulder. Normal muscle tone.  Skin: Warm, dry, intact no rashes, lesions, ulcers. No induration Neurologic: CN 2-12 grossly intact.  Normal speech.  Sensation intact, Strength 5/5 in all extremities.   Psychiatric: Normal judgment and insight.  Normal mood.    Labs on Admission: I have personally reviewed following labs and imaging studies  CBC: Recent Labs  Lab 07/03/20 2346  WBC 14.8*  NEUTROABS 10.5*  HGB 13.7  HCT 43.1  MCV 91.9  PLT 710    Basic Metabolic Panel: Recent Labs  Lab 07/03/20 2346  NA 141  K 3.1*  CL 103  CO2 28  GLUCOSE 114*  BUN 15  CREATININE 1.00  CALCIUM 8.8*    GFR: Estimated Creatinine Clearance: 70.1 mL/min (by C-G  formula based on SCr of 1 mg/dL).  Liver Function Tests: Recent Labs  Lab 07/03/20 2346  AST 24  ALT 24  ALKPHOS 198*  BILITOT 0.5  PROT 7.0  ALBUMIN 3.2*    Urine analysis:    Component Value Date/Time   COLORURINE YELLOW 07/04/2020 0222   APPEARANCEUR CLOUDY (A) 07/04/2020 0222   LABSPEC 1.019 07/04/2020 0222   PHURINE 5.0 07/04/2020 0222   GLUCOSEU NEGATIVE 07/04/2020 0222   HGBUR NEGATIVE 07/04/2020 0222   BILIRUBINUR NEGATIVE 07/04/2020 0222   KETONESUR 5 (A) 07/04/2020 0222   PROTEINUR 30 (A) 07/04/2020 0222   UROBILINOGEN 0.2 07/07/2013 1315   NITRITE POSITIVE (A) 07/04/2020 0222   LEUKOCYTESUR LARGE (A) 07/04/2020 0222    Radiological Exams on Admission: DG Chest Port 1 View  Result Date: 07/04/2020 CLINICAL DATA:  Sepsis, vomiting, short of breath EXAM: PORTABLE CHEST 1 VIEW COMPARISON:  11/27/2014 FINDINGS: 2 frontal views of the chest demonstrate an unremarkable cardiac silhouette. There is diffuse increased interstitial prominence, with bilateral  perihilar airspace disease greatest in the lower lung zones. No effusion or pneumothorax. No acute bony abnormalities. IMPRESSION: 1. Diffuse interstitial prominence with basilar predominant perihilar consolidation. Appearance could reflect multifocal pneumonia, aspiration, or edema. Electronically Signed   By: Randa Ngo M.D.   On: 07/04/2020 00:05    EKG: Independently reviewed.  Sinus rhythm with left atrial enlargement and LVH by voltage criteria.  No acute ST elevation or depression.  QTc prolonged at 495  Assessment/Plan Principal Problem:   NSTEMI (non-ST elevated myocardial infarction) Mr. Schliep is admitted to Cardiac telemetry floor. Initial troponin is elevated at 367, Started on heparin infusion in the ER.  Check serial troponin levels.  Consult cardiology for evaluation to determine if cardiac cath indicated.  Continue beta blocker, statin therapy. Check lipid panel.  Monitor BP.  Check electrolytes and renal function in morning. Check BNP and echocardiogram.   Active Problems:   Essential hypertension Monitor BP and continue home dose of norvasc, HCTZ, atenolol    COPD (chronic obstructive pulmonary disease)  Incruse will be substitued for Spiriva per formulary at hospital. Duoneb every 6 hours. Albuterol MDI with spacer as needed. Incentive spirometer every two hours while awake.     Hyperlipidemia Continue home dose of Zocor.  Check lipid panel in morning    Prolonged QT interval QT interval is prolonged at 495.  Avoid medications that can further prolong QT interval. Patient was given azithromycin in the emergency room.  This is stopped and changed to doxycycline for antibiotic coverage    CAP (community acquired pneumonia) Patient has perihilar consolidation on chest x-ray which is read as multifocal pneumonia versus edema.  Patient continued on Rocephin and doxycycline for about coverage until pneumonia can be definitively determined.  CTA of chest is  pending    Elevated d-dimer Elevated D-dimer level.  Initially patient was ordered for a CT of the chest without contrast but I changes to a CT angiography of the chest to rule out PE.  Is important to definitively determine if PE is present this would dictate if patient will need long-term anticoagulation.    Leukocytosis BBC elevated at 14,000.  Recheck CBC in the morning.    Hypokalemia Check magnesium level. Replete potassium Monitor electrolytes.    DVT prophylaxis: Pt is on heparin infusion with NSTEMI and elevated D-dimer Code Status:   Full code  Family Communication:  Diagnosis and plan discussed with pt. Pt verbalizes understanding and agrees with plan. Further  recommendations to follow as clinically indicated.  Disposition Plan:   Patient is from:  Home  Anticipated DC to:  Home  Anticipated DC date:  Anticipate 2 midnight or more stay to treat acute condition.   Anticipated DC barriers: No barriers to discharge identified at this time  Consults called:     Cardiology  Admission status:  Inpatient   Yevonne Aline Minie Roadcap MD [facility]  How to contact the [facility] Attending or Consulting provider [place] or covering provider during after hours [place], for this patient?   1. Check the care team in [facility] and look for a) attending/consulting [facility] provider listed and b) the [facility] team listed 2. Log into [email or web address] and use [facility] universal password to access. If you do not have the password, please contact the hospital operator. 3. Locate the [facility] provider you are looking for under [facility] and page to a number that you can be directly reached. 4. If you still have difficulty reaching the provider, please page the [facility] (Director on Call) for the Hospitalists listed on amion for assistance.  07/04/2020, 3:02 AM    Addendum: No PE on CTA chest. Pt with pulmonary edema on CT. Given lasix. Check BNP and obain echo in am.

## 2020-07-04 NOTE — Progress Notes (Signed)
[place] for heparin Indication: chest pain/ACS  Allergies  Allergen Reactions  . Oxycodone Nausea And Vomiting  . Pravastatin Itching    Other reaction(s): Xerostomia, Itching    Patient Measurements: Height: 5\' 10"  (177.8 cm) Weight: 84.4 kg (186 lb 1.1 oz) IBW/kg (Calculated) : 73 Heparin Dosing Weight: 83.9 kg Vital Signs: Temp: 98.7 F (37.1 C) (05/29 1943) Temp Source: Oral (05/29 1943) BP: 106/66 (05/29 1943) Pulse Rate: 79 (05/29 1943)  Labs: Recent Labs    07/03/20 2346 07/04/20 0304 07/04/20 0456 07/04/20 0938 07/04/20 1332 07/04/20 1939  HGB 13.7  --  11.8*  --   --   --   HCT 43.1  --  36.3*  --   --   --   PLT 335  --  278  --   --   --   APTT 29  --   --   --   --   --   LABPROT 13.5  --   --   --   --   --   INR 1.0  --   --   --   --   --   HEPARINUNFRC  --   --   --  0.14*  --  0.18*  CREATININE 1.00  --  0.93  --   --   --   TROPONINIHS 367* 1,224*  --  2,594* 2,193*  --     Estimated Creatinine Clearance: 73 mL/min (by C-G formula based on SCr of 0.93 mg/dL).   Medical History: Past Medical History:  Diagnosis Date  . COPD (chronic obstructive pulmonary disease) ([place])   . Hypercholesteremia   . Hypertension     Assessment: 75 yo man to start heparin for CP.  He was not on anticoagulation PTA.   Repeat HL has trended up but remains subtherapeutic at 0.18 on 1400 units/hr of IV heparin. RN reports no s/s of bleeding   Goal of Therapy:  Heparin level 0.3-0.7 units/ml Monitor platelets by anticoagulation protocol: Yes   Plan:  Heparin 2000 unit bolus and increase drip to 1600 units/hr Check heparin level 6-8 hours  Daily HL and CBC while on heparin Monitor for bleeding complications  Albertina Parr, PharmD., BCPS, BCCCP Clinical Pharmacist Please refer to [facility] for unit-specific pharmacist

## 2020-07-04 NOTE — Progress Notes (Signed)
[place] for heparin Indication: chest pain/ACS  Allergies  Allergen Reactions  . Oxycodone Nausea And Vomiting  . Pravastatin Itching    Other reaction(s): Xerostomia, Itching    Patient Measurements: Height: 5\' 10"  (177.8 cm) Weight: 84.4 kg (186 lb 1.1 oz) IBW/kg (Calculated) : 73 Heparin Dosing Weight: 83.9 kg Vital Signs: Temp: 98.3 F (36.8 C) (05/29 0756) Temp Source: Oral (05/29 0756) BP: 148/100 (05/29 1009) Pulse Rate: 90 (05/29 1009)  Labs: Recent Labs    07/03/20 2346 07/04/20 0304 07/04/20 0456 07/04/20 0938  HGB 13.7  --  11.8*  --   HCT 43.1  --  36.3*  --   PLT 335  --  278  --   APTT 29  --   --   --   LABPROT 13.5  --   --   --   INR 1.0  --   --   --   HEPARINUNFRC  --   --   --  0.14*  CREATININE 1.00  --  0.93  --   TROPONINIHS 367* 1,224*  --  2,594*    Estimated Creatinine Clearance: 73 mL/min (by C-G formula based on SCr of 0.93 mg/dL).   Medical History: Past Medical History:  Diagnosis Date  . COPD (chronic obstructive pulmonary disease) ([place])   . Hypercholesteremia   . Hypertension     Assessment: 75 yo man to start heparin for CP.  He was not on anticoagulation PTA.   Initial heparin level this morning is below goal at 0.14. Hgb down overnight from 13.7>11.8, plt count also down 335>278. No bleeding issues noted. Troponins rising to over 2,500.   Goal of Therapy:  Heparin level 0.3-0.7 units/ml Monitor platelets by anticoagulation protocol: Yes   Plan:  Heparin 2000 unit bolus and increase drip to 1400 units/hr Check heparin level 6-8 hours  Daily HL and CBC while on heparin Monitor for bleeding complications  Erin Hearing PharmD., BCPS Clinical Pharmacist 07/04/2020 10:38 AM

## 2020-07-04 NOTE — Progress Notes (Signed)
Paged Dr. Tonie Griffith regarding Troponin 1224.  Heparin gtt continues to infuse.  Pt denies CP, SOB at this time.  Will continue to monitor.

## 2020-07-04 NOTE — Progress Notes (Signed)
  Echocardiogram 2D Echocardiogram has been performed.  Matilde Bash 07/04/2020, 12:25 PM

## 2020-07-04 NOTE — Consult Note (Signed)
Cardiology Consultation:   Patient ID: Blake Sutton MRN: [id number]; DOB: [birth year]  Admit date: 07/03/2020 Date of Consult: 07/04/2020  Primary Care Provider: Pcp, No Primary Cardiologist: None  Primary Electrophysiologist:  None    Patient Profile:   Blake Sutton is a 75 y.o. male with a hx of HTN, HLD, COPD who is being seen today for the evaluation of chest pressure, SOB.  History of Present Illness:   Blake Sutton reports developing acute onset chest pressure and shortness of breath yesterday. He was at rest at the time and not doing anything out of the ordinary. He has no known history of coronary disease and no history of exertional chest pain/pressure. The pressure was in the L chest and non-radiating. He was brought to the ED for further workup.   In the ED the patient's vital signs were significant for mild hypertension. A d-dimer was elevated. CT-PE was without acute PE but did show some pulmonary edema. His troponin was elevated to 367 initially with a follow up of 1224. He remained chest pain free throughout his ED course. He was given aspirin and started on a heparin drip. He was admitted to the medicine service with cardiology consultation.   Heart Pathway Score:     Past Medical History:  Diagnosis Date  . COPD (chronic obstructive pulmonary disease) ([place])   . Hypercholesteremia   . Hypertension     Past Surgical History:  Procedure Laterality Date  . ABDOMINAL SURGERY     "repair bleeding ulcers"  . NECK SURGERY    . ROTATOR CUFF REPAIR       Home Medications:  Prior to Admission medications   Medication Sig Start Date End Date Taking? Authorizing Provider  albuterol (PROVENTIL HFA;VENTOLIN HFA) 108 (90 BASE) MCG/ACT inhaler Inhale 1-2 puffs into the lungs every 6 (six) hours as needed for wheezing or shortness of breath.   Yes [provider]  amLODipine (NORVASC) 5 MG tablet Take 5 mg by mouth daily. 10/06/19  Yes [provider]   atenolol (TENORMIN) 25 MG tablet Take 25 mg by mouth daily.   Yes [provider]  buPROPion (WELLBUTRIN SR) 100 MG 12 hr tablet Take 100 mg by mouth 2 (two) times daily. 10/06/19  Yes [provider]  celecoxib (CELEBREX) 200 MG capsule Take 200 mg by mouth daily.  06/25/13  Yes [provider]  FLUoxetine (PROZAC) 20 MG capsule Take 20 mg by mouth daily.   Yes [provider]  hydrochlorothiazide (HYDRODIURIL) 25 MG tablet Take 25 mg by mouth daily.   Yes [provider]  HYDROcodone-acetaminophen (NORCO/VICODIN) 5-325 MG per tablet Take 1 tablet by mouth every 6 (six) hours as needed (pain).  06/25/13  Yes [provider]  meloxicam (MOBIC) 15 MG tablet Take 15 mg by mouth daily as needed for pain. 10/06/19  Yes [provider]  omeprazole (PRILOSEC) 20 MG capsule Take 20 mg by mouth daily.   Yes [provider]  sildenafil (VIAGRA) 100 MG tablet Take 100 mg by mouth as needed for erectile dysfunction. 10/06/19  Yes [provider]  simvastatin (ZOCOR) 40 MG tablet Take 40 mg by mouth at bedtime.   Yes [provider]  tamsulosin (FLOMAX) 0.4 MG CAPS capsule Take 0.4 mg by mouth daily. 10/06/19  Yes [provider]  traZODone (DESYREL) 50 MG tablet Take 50 mg by mouth at bedtime.   Yes [provider]    Inpatient Medications: Scheduled Meds: . furosemide  40 mg Intravenous Q12H   Continuous Infusions: . sodium chloride 150 mL/hr at 07/03/20 2355  . heparin 1,150 Units/hr (07/04/20 0301)   PRN Meds:   Allergies:    Allergies  Allergen Reactions  . Oxycodone Nausea And Vomiting  . Pravastatin Itching    Other reaction(s): Xerostomia, Itching    Social History:   Social History   Socioeconomic History  . Marital status: Legally Separated    Spouse name: Not on file  . Number of children: Not on file  . Years of education: Not on file  . Highest education level: Not on file   Occupational History  . Not on file  Tobacco Use  . Smoking status: Current Every Day Smoker  . Smokeless tobacco: Not on file  Substance and Sexual Activity  . Alcohol use: No  . Drug use: No  . Sexual activity: Not on file  Other Topics Concern  . Not on file  Social History Narrative  . Not on file   Social Determinants of Health   Financial Resource Strain: Not on file  Food Insecurity: Not on file  Transportation Needs: Not on file  Physical Activity: Not on file  Stress: Not on file  Social Connections: Not on file  Intimate Partner Violence: Not on file    Family History:   History reviewed. No pertinent family history.   ROS:  Please see the history of present illness.   All other ROS reviewed and negative.     Physical Exam/Data:   Vitals:   07/04/20 0200 07/04/20 0215 07/04/20 0230 07/04/20 0345  BP: (!) 142/110 (!) 139/116 (!) 158/85 (!) 149/97  Pulse: 90 91 94 89  Resp: (!) 26 20 16 17   Temp:      TempSrc:      SpO2: 94% 93% 92% 95%  Weight:      Height:        Intake/Output Summary (Last 24 hours) at 07/04/2020 0432 Last data filed at 07/04/2020 0255 Gross per 24 hour  Intake 100 ml  Output --  Net 100 ml   Last 3 Weights 07/03/2020  Weight (lbs) 185 lb  Weight (kg) 83.915 kg     Body mass index is 25.8 kg/m.  General:  Well nourished, well developed, in no acute distress HEENT: normal Neck: no JVD Endocrine:  No thryomegaly Cardiac:  normal S1, S2; RRR; no murmur  Lungs:  Minimal bibasilar rales Abd: soft, nontender, no hepatomegaly  Ext: no edema Musculoskeletal:  No deformities, BUE and BLE strength normal and equal Skin: warm and dry  Neuro:  CNs 2-12 intact, no focal abnormalities noted Psych:  Normal affect   EKG:  The EKG was personally reviewed and demonstrates:  NSR, nonspecific T wave flattening throughout  Relevant CV Studies: None  Laboratory Data:  High Sensitivity Troponin:   Recent Labs  Lab 07/03/20 2346  07/04/20 0304  TROPONINIHS 367* 1,224*     Chemistry Recent Labs  Lab 07/03/20 2346  NA 141  K 3.1*  CL 103  CO2 28  GLUCOSE 114*  BUN 15  CREATININE 1.00  CALCIUM 8.8*  GFRNONAA >60  ANIONGAP 10    Recent Labs  Lab 07/03/20 2346  PROT 7.0  ALBUMIN 3.2*  AST 24  ALT 24  ALKPHOS 198*  BILITOT 0.5   Hematology Recent Labs  Lab 07/03/20 2346  WBC 14.8*  RBC 4.69  HGB 13.7  HCT 43.1  MCV 91.9  MCH 29.2  MCHC 31.8  RDW 14.3  PLT 335   BNPNo results for input(s): BNP, PROBNP in the last 168 hours.  DDimer  Recent Labs  Lab 07/03/20 2346  DDIMER 2.49*     Radiology/Studies:  CT ANGIO CHEST PE W OR WO CONTRAST  Result Date: 07/04/2020 CLINICAL DATA:  Shortness of breath after vomiting EXAM: CT ANGIOGRAPHY CHEST WITH CONTRAST TECHNIQUE: Multidetector CT imaging of the chest was performed using the standard protocol during bolus administration of intravenous contrast. Multiplanar CT image reconstructions and MIPs were obtained to evaluate the vascular anatomy. CONTRAST:  83mL OMNIPAQUE IOHEXOL 350 MG/ML SOLN COMPARISON:  07/03/2020 FINDINGS: Cardiovascular: This is a technically adequate evaluation of the pulmonary vasculature. No filling defects or pulmonary emboli. No pericardial effusion. Moderate atherosclerosis throughout the coronary vasculature greatest in the LAD distribution. There is aneurysmal dilatation of the ascending thoracic aorta measuring up to 4.4 cm in diameter. There is moderate diffuse aortic atherosclerosis. Mediastinum/Nodes: Mediastinal adenopathy identified, measuring up to 14 mm in short axis in the AP window. Thyroid, trachea, and esophagus are grossly normal. Lungs/Pleura: There are bilateral pleural effusions, less than 1 L each. Bilateral perihilar ground-glass airspace disease is identified, with basilar predominant interlobular septal thickening. Dependent lower lobe atelectasis. No pneumothorax. The central airways are patent. Upper  Abdomen: Small calcifications are seen at the bilateral renal hila, likely vascular. No acute upper abdominal findings. Musculoskeletal: No acute or destructive bony lesions. Reconstructed images demonstrate no additional findings. Review of the MIP images confirms the above findings. IMPRESSION: 1. No evidence of pulmonary embolus. 2. Bilateral perihilar ground-glass airspace disease, basilar predominant interlobular septal thickening, and bilateral pleural effusions as above. Favor fluid overload over infection. 3. Nonspecific mediastinal lymphadenopathy, which may be reactive. 4. 4.4 cm ascending thoracic aortic aneurysm. Recommend annual imaging followup by CTA or MRA. This recommendation follows 2010 ACCF/AHA/AATS/ACR/ASA/SCA/SCAI/SIR/STS/SVM Guidelines for the Diagnosis and Management of Patients with Thoracic Aortic Disease. Circulation. 2010; 121: X323-F573. Aortic aneurysm NOS (ICD10-I71.9) 5.  Aortic Atherosclerosis (ICD10-I70.0). Electronically Signed   By: Randa Ngo M.D.   On: 07/04/2020 03:17   DG Chest Port 1 View  Result Date: 07/04/2020 CLINICAL DATA:  Sepsis, vomiting, short of breath EXAM: PORTABLE CHEST 1 VIEW COMPARISON:  11/27/2014 FINDINGS: 2 frontal views of the chest demonstrate an unremarkable cardiac silhouette. There is diffuse increased interstitial prominence, with bilateral perihilar airspace disease greatest in the lower lung zones. No effusion or pneumothorax. No acute bony abnormalities. IMPRESSION: 1. Diffuse interstitial prominence with basilar predominant perihilar consolidation. Appearance could reflect multifocal pneumonia, aspiration, or edema. Electronically Signed   By: Randa Ngo M.D.   On: 07/04/2020 00:05      TIMI Risk Score for Unstable Angina or Non-ST Elevation MI:   The patient's TIMI risk score is 4, which indicates a 20% risk of all cause mortality, new or recurrent myocardial infarction or need for urgent revascularization in the next 14 days.    Assessment and Plan:  Blake Sutton is a 75 y.o. male with a hx of HTN, HLD, COPD who is being seen today for the evaluation of chest pressure, SOB, found to have elevated troponin.   #) NSTEMI: chest symptoms suggestive of type 1 ACS event. Patient currently hemodynamically stable but does have some evidence of new onset heart failure with SOB and pulmonary edema.  - echo in AM - check lipids, A1c - plan for cath on Tuesday - ASA 324mg  then 81mg  daily - heparin drip for ACS per pharmacy protocol - stop simvastatin  and start atorvastatin 80mg  QHS - stop atenolol and start carvedilol 12.5mg  BID - SLN, nitro gtt PRN - defer P2Y12 until after cath - cardiac rehab - smoking cessation counseling      For questions or updates, please contact [place] Please consult [email or web address] for contact info under     Signed, Marcie Mowers, MD  07/04/2020 4:32 AM

## 2020-07-04 NOTE — Progress Notes (Signed)
PROGRESS NOTE    KHAMARI SHEEHAN  DVV:[id number] DOB: [birth year] DOA: 07/03/2020 PCP: Pcp, No  [id number]/2C04C-01   Assessment & Plan:   Principal Problem:   NSTEMI (non-ST elevated myocardial infarction) ([place]) Active Problems:   Essential hypertension   COPD (chronic obstructive pulmonary disease) (HCC)   Hyperlipidemia   Elevated d-dimer   Leukocytosis   Prolonged QT interval   CAP (community acquired pneumonia)   MESIAH MANZO is a 75 y.o. male with medical history significant for HTN, HLD, COPD, presents by EMS with complaint of Chest pain and SOB. Was at home when developed central chest pain and pressure. States it felt like a rock on his sternum and was associated with SOB and nausea. He did not have a cough or fever. He states he has not had swelling of his legs. He has no history of blood clots. States he has not had prolonged immobilization. Denies history of CHF. Denies hx of CAD or cardiac intervention in past.  Continues to smoke occasionally.    NSTEMI (non-ST elevated myocardial infarction) --Initial troponin is elevated at 367, peaked at 2594. --Started on heparin infusion in the ER.  --cardiology consulted Plan: --cont heparin gtt --stop home atenolol and start coreg 12.5 mg BID, per card rec --start ASA 81 daily - stop simvastatin and start atorvastatin 80mg  QHS --Echo --plan for heart cath on Tuesday - defer P2Y12 until after cath - cardiac rehab - smoking cessation counseling  Acute hypoxic respiratory failure 2/2 Fluid overload --CT chest showed fluid overload --BNP 1155 Plan: --cont IV lasix BID --Strict I/O    Essential hypertension --BP intermittently soft --cont IV lasix for diuresis --start coreg --hold home Norvasc and HCTZ for now    COPD (chronic obstructive pulmonary disease)  --stable, no wheezing --cont daily inhaler    Hyperlipidemia - stop simvastatin and start atorvastatin 80mg  QHS    CAP (community acquired pneumonia),  ruled out --lung imaging finding more consistent with fluid overload than infection --procal neg --d/c abx    Elevated d-dimer --CTA chest neg for PE    Hypokalemia --replete PRN   DVT prophylaxis: GY:IRSWNIO gtt Code Status: Full code  Family Communication:  Level of care: Telemetry Cardiac Dispo:   The patient is from: home Anticipated d/c is to: home Anticipated d/c date is: after Tuesday Patient currently is not medically ready to d/c due to: pending heart cath on Tuesday   Subjective and Interval History:  Pt reported dyspnea improved.  No chest pain.  Good urine output.   Objective: Vitals:   07/04/20 1009 07/04/20 1059 07/04/20 1522 07/04/20 1827  BP: (!) 148/100 133/78 103/74 105/75  Pulse: 90 90 84 86  Resp:  19 20   Temp:   98 F (36.7 C)   TempSrc:   Oral   SpO2:  96% 94%   Weight:      Height:        Intake/Output Summary (Last 24 hours) at 07/04/2020 1853 Last data filed at 07/04/2020 1745 Gross per 24 hour  Intake 934.49 ml  Output 5050 ml  Net -4115.51 ml   Filed Weights   07/03/20 2301 07/04/20 0400  Weight: 83.9 kg 84.4 kg    Examination:   Constitutional: NAD, AAOx3 HEENT: conjunctivae and lids normal, EOMI CV: No cyanosis.   RESP: normal respiratory effort, clear, on 4L Extremities: No effusions, edema in BLE SKIN: warm, dry Neuro: II - XII grossly intact.   Psych: Normal mood and affect.  Appropriate judgement  and reason   Data Reviewed: I have personally reviewed following labs and imaging studies  CBC: Recent Labs  Lab 07/03/20 2346 07/04/20 0456  WBC 14.8* 12.0*  NEUTROABS 10.5*  --   HGB 13.7 11.8*  HCT 43.1 36.3*  MCV 91.9 89.4  PLT 335 814   Basic Metabolic Panel: Recent Labs  Lab 07/03/20 2346 07/04/20 0456  NA 141 138  K 3.1* 3.5  CL 103 105  CO2 28 24  GLUCOSE 114* 171*  BUN 15 14  CREATININE 1.00 0.93  CALCIUM 8.8* 8.2*   GFR: Estimated Creatinine Clearance: 73 mL/min (by C-G formula based on  SCr of 0.93 mg/dL). Liver Function Tests: Recent Labs  Lab 07/03/20 2346  AST 24  ALT 24  ALKPHOS 198*  BILITOT 0.5  PROT 7.0  ALBUMIN 3.2*   No results for input(s): LIPASE, AMYLASE in the last 168 hours. No results for input(s): AMMONIA in the last 168 hours. Coagulation Profile: Recent Labs  Lab 07/03/20 2346  INR 1.0   Cardiac Enzymes: No results for input(s): CKTOTAL, CKMB, CKMBINDEX, TROPONINI in the last 168 hours. BNP (last 3 results) No results for input(s): PROBNP in the last 8760 hours. HbA1C: No results for input(s): HGBA1C in the last 72 hours. CBG: No results for input(s): GLUCAP in the last 168 hours. Lipid Profile: Recent Labs    07/04/20 0456  CHOL 170  HDL 51  LDLCALC 112*  TRIG 35  CHOLHDL 3.3   Thyroid Function Tests: No results for input(s): TSH, T4TOTAL, FREET4, T3FREE, THYROIDAB in the last 72 hours. Anemia Panel: No results for input(s): VITAMINB12, FOLATE, FERRITIN, TIBC, IRON, RETICCTPCT in the last 72 hours. Sepsis Labs: Recent Labs  Lab 07/03/20 2328 07/04/20 0128 07/04/20 0938  PROCALCITON  --   --  <0.10  LATICACIDVEN 1.9 1.3  --     Recent Results (from the past 240 hour(s))  Blood culture (routine single)     Status: None (Preliminary result)   Collection Time: 07/03/20 11:30 PM   Specimen: BLOOD  Result Value Ref Range Status   Specimen Description BLOOD SITE NOT SPECIFIED  Final   Special Requests   Final    BOTTLES DRAWN AEROBIC AND ANAEROBIC Blood Culture adequate volume   Culture   Final    NO GROWTH < 12 HOURS Performed at [facility], [place] [address], [place], [place] [address]    Report Status PENDING  Incomplete  SARS CORONAVIRUS 2 (TAT 6-24 HRS) Nasopharyngeal Nasopharyngeal Swab     Status: None   Collection Time: 07/03/20 11:31 PM   Specimen: Nasopharyngeal Swab  Result Value Ref Range Status   SARS Coronavirus 2 NEGATIVE NEGATIVE Final    Comment: (NOTE) SARS-CoV-2 target nucleic acids are NOT  DETECTED.  The SARS-CoV-2 RNA is generally detectable in upper and lower respiratory specimens during the acute phase of infection. Negative results do not preclude SARS-CoV-2 infection, do not rule out co-infections with other pathogens, and should not be used as the sole basis for treatment or other patient management decisions. Negative results must be combined with clinical observations, patient history, and epidemiological information. The expected result is Negative.  Fact Sheet for Patients: SugarRoll.be  Fact Sheet for Healthcare Providers: https://www.woods-mathews.com/  This test is not yet approved or cleared by the [place] FDA and  has been authorized for detection and/or diagnosis of SARS-CoV-2 by FDA under an Emergency Use Authorization (EUA). This EUA will remain  in effect (meaning this test can  be used) for the duration of the COVID-19 declaration under Se ction 564(b)(1) of the Act, 21 U.S.C. section 360bbb-3(b)(1), unless the authorization is terminated or revoked sooner.  Performed at [facility], [place] [address] [place], [place] [address]   MRSA PCR Screening     Status: None   Collection Time: 07/04/20  4:46 AM   Specimen: Nasal Mucosa; Nasopharyngeal  Result Value Ref Range Status   MRSA by PCR NEGATIVE NEGATIVE Final    Comment:        The GeneXpert MRSA Assay (FDA approved for NASAL specimens only), is one component of a comprehensive MRSA colonization surveillance program. It is not intended to diagnose MRSA infection nor to guide or monitor treatment for MRSA infections. Performed at [facility], [place] [address], [place], [place] [address]       Radiology Studies: CT ANGIO CHEST PE W OR WO CONTRAST  Result Date: 07/04/2020 CLINICAL DATA:  Shortness of breath after vomiting EXAM: CT ANGIOGRAPHY CHEST WITH CONTRAST TECHNIQUE: Multidetector CT imaging of the chest was performed using  the standard protocol during bolus administration of intravenous contrast. Multiplanar CT image reconstructions and MIPs were obtained to evaluate the vascular anatomy. CONTRAST:  69mL OMNIPAQUE IOHEXOL 350 MG/ML SOLN COMPARISON:  07/03/2020 FINDINGS: Cardiovascular: This is a technically adequate evaluation of the pulmonary vasculature. No filling defects or pulmonary emboli. No pericardial effusion. Moderate atherosclerosis throughout the coronary vasculature greatest in the LAD distribution. There is aneurysmal dilatation of the ascending thoracic aorta measuring up to 4.4 cm in diameter. There is moderate diffuse aortic atherosclerosis. Mediastinum/Nodes: Mediastinal adenopathy identified, measuring up to 14 mm in short axis in the AP window. Thyroid, trachea, and esophagus are grossly normal. Lungs/Pleura: There are bilateral pleural effusions, less than 1 L each. Bilateral perihilar ground-glass airspace disease is identified, with basilar predominant interlobular septal thickening. Dependent lower lobe atelectasis. No pneumothorax. The central airways are patent. Upper Abdomen: Small calcifications are seen at the bilateral renal hila, likely vascular. No acute upper abdominal findings. Musculoskeletal: No acute or destructive bony lesions. Reconstructed images demonstrate no additional findings. Review of the MIP images confirms the above findings. IMPRESSION: 1. No evidence of pulmonary embolus. 2. Bilateral perihilar ground-glass airspace disease, basilar predominant interlobular septal thickening, and bilateral pleural effusions as above. Favor fluid overload over infection. 3. Nonspecific mediastinal lymphadenopathy, which may be reactive. 4. 4.4 cm ascending thoracic aortic aneurysm. Recommend annual imaging followup by CTA or MRA. This recommendation follows 2010 ACCF/AHA/AATS/ACR/ASA/SCA/SCAI/SIR/STS/SVM Guidelines for the Diagnosis and Management of Patients with Thoracic Aortic Disease. Circulation.  2010; 121: F790-W409. Aortic aneurysm NOS (ICD10-I71.9) 5.  Aortic Atherosclerosis (ICD10-I70.0). Electronically Signed   By: Randa Ngo M.D.   On: 07/04/2020 03:17   DG Chest Port 1 View  Result Date: 07/04/2020 CLINICAL DATA:  Sepsis, vomiting, short of breath EXAM: PORTABLE CHEST 1 VIEW COMPARISON:  11/27/2014 FINDINGS: 2 frontal views of the chest demonstrate an unremarkable cardiac silhouette. There is diffuse increased interstitial prominence, with bilateral perihilar airspace disease greatest in the lower lung zones. No effusion or pneumothorax. No acute bony abnormalities. IMPRESSION: 1. Diffuse interstitial prominence with basilar predominant perihilar consolidation. Appearance could reflect multifocal pneumonia, aspiration, or edema. Electronically Signed   By: Randa Ngo M.D.   On: 07/04/2020 00:05   ECHOCARDIOGRAM COMPLETE  Result Date: 07/04/2020    ECHOCARDIOGRAM REPORT   Patient Name:   KAITO SCHULENBURG Date of Exam: 07/04/2020 Medical Rec #:  [id number]  Height:       70.0 in Accession #:    [id number]      Weight:       186.1 lb Date of Birth:  1946/02/01       BSA:          2.024 m Patient Age:    65 years        BP:           133/78 mmHg Patient Gender: M               HR:           90 bpm. Exam Location:  Inpatient Procedure: 2D Echo, Cardiac Doppler and Color Doppler Indications:    NSTEMI  History:        Patient has no prior history of Echocardiogram examinations.                 COPD, Signs/Symptoms:Shortness of Breath and Chest Pain; Risk                 Factors:Hypertension, Dyslipidemia and Current Smoker.                 Pneumonia.  Sonographer:    Dustin Flock Referring Phys: [id number] [place]  1. Hypokinesis of the basal inferolateral wall with overall preserved LV function.  2. Left ventricular ejection fraction, by estimation, is 55 to 60%. The left ventricle has normal function. The left ventricle demonstrates regional wall motion  abnormalities (see scoring diagram/findings for description). There is mild left ventricular  hypertrophy. Left ventricular diastolic parameters are indeterminate. Elevated left atrial pressure.  3. Right ventricular systolic function is normal. The right ventricular size is normal. There is mildly elevated pulmonary artery systolic pressure.  4. The mitral valve is normal in structure. Moderate mitral valve regurgitation. No evidence of mitral stenosis.  5. The aortic valve is tricuspid. Aortic valve regurgitation is not visualized. Mild aortic valve sclerosis is present, with no evidence of aortic valve stenosis.  6. The inferior vena cava is normal in size with greater than 50% respiratory variability, suggesting right atrial pressure of 3 mmHg. FINDINGS  Left Ventricle: Left ventricular ejection fraction, by estimation, is 55 to 60%. The left ventricle has normal function. The left ventricle demonstrates regional wall motion abnormalities. The left ventricular internal cavity size was normal in size. There is mild left ventricular hypertrophy. Left ventricular diastolic parameters are indeterminate. Elevated left atrial pressure. Right Ventricle: The right ventricular size is normal. No increase in right ventricular wall thickness. Right ventricular systolic function is normal. There is mildly elevated pulmonary artery systolic pressure. The tricuspid regurgitant velocity is 2.97  m/s, and with an assumed right atrial pressure of 3 mmHg, the estimated right ventricular systolic pressure is 23.5 mmHg. Left Atrium: Left atrial size was normal in size. Right Atrium: Right atrial size was normal in size. Pericardium: There is no evidence of pericardial effusion. Mitral Valve: The mitral valve is normal in structure. Moderate mitral valve regurgitation. No evidence of mitral valve stenosis. Tricuspid Valve: The tricuspid valve is normal in structure. Tricuspid valve regurgitation is trivial. No evidence of tricuspid  stenosis. Aortic Valve: The aortic valve is tricuspid. Aortic valve regurgitation is not visualized. Mild aortic valve sclerosis is present, with no evidence of aortic valve stenosis. Pulmonic Valve: The pulmonic valve was normal in structure. Pulmonic valve regurgitation is not visualized. No evidence of pulmonic stenosis. Aorta: The aortic root is normal in size and  structure. Venous: The inferior vena cava is normal in size with greater than 50% respiratory variability, suggesting right atrial pressure of 3 mmHg. IAS/Shunts: No atrial level shunt detected by color flow Doppler. Additional Comments: Hypokinesis of the basal inferolateral wall with overall preserved LV function.  LEFT VENTRICLE PLAX 2D LVIDd:         5.30 cm  Diastology LVIDs:         3.80 cm  LV e' medial:    5.55 cm/s LV PW:         1.10 cm  LV E/e' medial:  19.6 LV IVS:        1.20 cm  LV e' lateral:   7.51 cm/s LVOT diam:     2.10 cm  LV E/e' lateral: 14.5 LV SV:         85 LV SV Index:   42 LVOT Area:     3.46 cm  RIGHT VENTRICLE RV Basal diam:  3.40 cm RV S prime:     19.90 cm/s TAPSE (M-mode): 3.0 cm LEFT ATRIUM             Index       RIGHT ATRIUM           Index LA diam:        4.30 cm 2.12 cm/m  RA Area:     24.10 cm LA Vol (A2C):   74.1 ml 36.61 ml/m RA Volume:   78.80 ml  38.93 ml/m LA Vol (A4C):   61.4 ml 30.33 ml/m LA Biplane Vol: 67.8 ml 33.49 ml/m  AORTIC VALVE LVOT Vmax:   112.00 cm/s LVOT Vmean:  77.700 cm/s LVOT VTI:    0.245 m  AORTA Ao Root diam: 3.40 cm MITRAL VALVE                TRICUSPID VALVE MV Area (PHT): 5.13 cm     TR Peak grad:   35.3 mmHg MV Decel Time: 148 msec     TR Vmax:        297.00 cm/s MV E velocity: 109.00 cm/s MV A velocity: 67.70 cm/s   SHUNTS MV E/A ratio:  1.61         Systemic VTI:  0.24 m                             Systemic Diam: 2.10 cm Kirk Ruths MD Electronically signed by Kirk Ruths MD Signature Date/Time: 07/04/2020/12:41:24 PM    Final      Scheduled Meds: . Derrill Memo ON  07/05/2020] aspirin EC  81 mg Oral Daily  . atorvastatin  80 mg Oral QHS  . buPROPion  100 mg Oral BID  . carvedilol  12.5 mg Oral BID WC  . furosemide  40 mg Intravenous Q12H  . pantoprazole  40 mg Oral Daily  . potassium chloride  40 mEq Oral BID  . tamsulosin  0.4 mg Oral Daily  . umeclidinium bromide  1 puff Inhalation Daily   Continuous Infusions: . heparin 1,400 Units/hr (07/04/20 1651)     LOS: 0 days   No charge note.   Enzo Bi, MD [facility] If 7PM-7AM, please contact night-coverage 07/04/2020, 6:53 PM

## 2020-07-04 NOTE — Progress Notes (Signed)
Progress Note  Patient Name: Blake Sutton Date of Encounter: 07/04/2020  [facility] Cardiologist: None   Subjective   Sleeping comfortably, easily aroused.  Stating that he is not having any further chest discomfort.  Earlier however chest left-sided, fingertips were numb.  Felt anxious.  Inpatient Medications    Scheduled Meds: . [START ON 07/05/2020] aspirin EC  81 mg Oral Daily  . atenolol  25 mg Oral Daily  . buPROPion  100 mg Oral BID  . furosemide  40 mg Intravenous Q12H  . pantoprazole  40 mg Oral Daily  . simvastatin  40 mg Oral QHS  . tamsulosin  0.4 mg Oral Daily  . umeclidinium bromide  1 puff Inhalation Daily   Continuous Infusions: . heparin 1,150 Units/hr (07/04/20 0605)   PRN Meds: acetaminophen, albuterol, LORazepam, nitroGLYCERIN, zolpidem   Vital Signs    Vitals:   07/04/20 0230 07/04/20 0345 07/04/20 0400 07/04/20 0756  BP: (!) 158/85 (!) 149/97 (!) 152/101 128/81  Pulse: 94 89 94 77  Resp: 16 17 20 18   Temp:   98 F (36.7 C) 98.3 F (36.8 C)  TempSrc:   Oral Oral  SpO2: 92% 95% 96% 95%  Weight:   84.4 kg   Height:   5\' 10"  (1.778 m)     Intake/Output Summary (Last 24 hours) at 07/04/2020 0922 Last data filed at 07/04/2020 0272 Gross per 24 hour  Intake 871.46 ml  Output 2525 ml  Net -1653.54 ml   Last 3 Weights 07/04/2020 07/03/2020  Weight (lbs) 186 lb 1.1 oz 185 lb  Weight (kg) 84.4 kg 83.915 kg      Telemetry    Sinus rhythm, no adverse arrhythmias- Personally Reviewed  ECG    Sinus rhythm nonspecific ST-T wave changes, LVH- Personally Reviewed  Physical Exam   GEN: No acute distress.   Neck: No JVD Cardiac: RRR, no murmurs, rubs, or gallops.  Respiratory: Clear to auscultation bilaterally. GI: Soft, nontender, non-distended  MS: No edema; No deformity. Neuro:  Nonfocal  Psych: Normal affect   Labs    High Sensitivity Troponin:   Recent Labs  Lab 07/03/20 2346 07/04/20 0304  TROPONINIHS 367* 1,224*       Chemistry Recent Labs  Lab 07/03/20 2346 07/04/20 0456  NA 141 138  K 3.1* 3.5  CL 103 105  CO2 28 24  GLUCOSE 114* 171*  BUN 15 14  CREATININE 1.00 0.93  CALCIUM 8.8* 8.2*  PROT 7.0  --   ALBUMIN 3.2*  --   AST 24  --   ALT 24  --   ALKPHOS 198*  --   BILITOT 0.5  --   GFRNONAA >60 >60  ANIONGAP 10 9     Hematology Recent Labs  Lab 07/03/20 2346 07/04/20 0456  WBC 14.8* 12.0*  RBC 4.69 4.06*  HGB 13.7 11.8*  HCT 43.1 36.3*  MCV 91.9 89.4  MCH 29.2 29.1  MCHC 31.8 32.5  RDW 14.3 14.5  PLT 335 278    BNP Recent Labs  Lab 07/04/20 0456  BNP 1,155.9*     DDimer  Recent Labs  Lab 07/03/20 2346  DDIMER 2.49*     Radiology    CT ANGIO CHEST PE W OR WO CONTRAST  Result Date: 07/04/2020 CLINICAL DATA:  Shortness of breath after vomiting EXAM: CT ANGIOGRAPHY CHEST WITH CONTRAST TECHNIQUE: Multidetector CT imaging of the chest was performed using the standard protocol during bolus administration of intravenous contrast. Multiplanar CT image  reconstructions and MIPs were obtained to evaluate the vascular anatomy. CONTRAST:  28mL OMNIPAQUE IOHEXOL 350 MG/ML SOLN COMPARISON:  07/03/2020 FINDINGS: Cardiovascular: This is a technically adequate evaluation of the pulmonary vasculature. No filling defects or pulmonary emboli. No pericardial effusion. Moderate atherosclerosis throughout the coronary vasculature greatest in the LAD distribution. There is aneurysmal dilatation of the ascending thoracic aorta measuring up to 4.4 cm in diameter. There is moderate diffuse aortic atherosclerosis. Mediastinum/Nodes: Mediastinal adenopathy identified, measuring up to 14 mm in short axis in the AP window. Thyroid, trachea, and esophagus are grossly normal. Lungs/Pleura: There are bilateral pleural effusions, less than 1 L each. Bilateral perihilar ground-glass airspace disease is identified, with basilar predominant interlobular septal thickening. Dependent lower lobe atelectasis.  No pneumothorax. The central airways are patent. Upper Abdomen: Small calcifications are seen at the bilateral renal hila, likely vascular. No acute upper abdominal findings. Musculoskeletal: No acute or destructive bony lesions. Reconstructed images demonstrate no additional findings. Review of the MIP images confirms the above findings. IMPRESSION: 1. No evidence of pulmonary embolus. 2. Bilateral perihilar ground-glass airspace disease, basilar predominant interlobular septal thickening, and bilateral pleural effusions as above. Favor fluid overload over infection. 3. Nonspecific mediastinal lymphadenopathy, which may be reactive. 4. 4.4 cm ascending thoracic aortic aneurysm. Recommend annual imaging followup by CTA or MRA. This recommendation follows 2010 ACCF/AHA/AATS/ACR/ASA/SCA/SCAI/SIR/STS/SVM Guidelines for the Diagnosis and Management of Patients with Thoracic Aortic Disease. Circulation. 2010; 121: G295-M841. Aortic aneurysm NOS (ICD10-I71.9) 5.  Aortic Atherosclerosis (ICD10-I70.0). Electronically Signed   By: Randa Ngo M.D.   On: 07/04/2020 03:17   DG Chest Port 1 View  Result Date: 07/04/2020 CLINICAL DATA:  Sepsis, vomiting, short of breath EXAM: PORTABLE CHEST 1 VIEW COMPARISON:  11/27/2014 FINDINGS: 2 frontal views of the chest demonstrate an unremarkable cardiac silhouette. There is diffuse increased interstitial prominence, with bilateral perihilar airspace disease greatest in the lower lung zones. No effusion or pneumothorax. No acute bony abnormalities. IMPRESSION: 1. Diffuse interstitial prominence with basilar predominant perihilar consolidation. Appearance could reflect multifocal pneumonia, aspiration, or edema. Electronically Signed   By: Randa Ngo M.D.   On: 07/04/2020 00:05     Patient Profile     75 y.o. male with non-ST elevation myocardial infarction, hypertension hyperlipidemia COPD  Assessment & Plan    Non-ST elevation myocardial infarction - Troponin  initially 367 follow-up 1224 - Rest pain left-sided - Transition from simvastatin to atorvastatin 80 mg - Heparin drip per ACS protocol - Echocardiogram ordered -BNP 1100 - We will give him Lasix 40 mg IV today.  Tomorrow possibly transition to p.o.  Does not appear to have a tremendous amount of excessive fluid. -Plan is to proceed with cardiac catheterization on Tuesday unless symptoms change or become more worrisome.  Currently chest pain-free.  Ascending thoracic aortic aneurysm - 4.4 cm on PE CT 07/04/2020.  Coronary artery disease - Moderate atherosclerosis throughout the coronary vasculature.  Greatest in the LAD distribution.  Tobacco use - Tobacco cessation counseling  COPD - Tobacco cessation.      For questions or updates, please contact [place] Please consult [email or web address] for contact info under        Signed, Candee Furbish, MD  07/04/2020, 9:22 AM

## 2020-07-04 NOTE — Plan of Care (Signed)
  Problem: Education: Goal: Understanding of medication regimen will improve Outcome: Progressing   Problem: Activity: Goal: Ability to tolerate increased activity will improve Outcome: Progressing   Problem: Cardiac: Goal: Ability to achieve and maintain adequate cardiopulmonary perfusion will improve Outcome: Progressing

## 2020-07-05 DIAGNOSIS — I5030 Unspecified diastolic (congestive) heart failure: Secondary | ICD-10-CM

## 2020-07-05 DIAGNOSIS — I5031 Acute diastolic (congestive) heart failure: Secondary | ICD-10-CM

## 2020-07-05 DIAGNOSIS — E785 Hyperlipidemia, unspecified: Secondary | ICD-10-CM

## 2020-07-05 DIAGNOSIS — J9601 Acute respiratory failure with hypoxia: Secondary | ICD-10-CM

## 2020-07-05 DIAGNOSIS — J449 Chronic obstructive pulmonary disease, unspecified: Secondary | ICD-10-CM | POA: Diagnosis not present

## 2020-07-05 DIAGNOSIS — I10 Essential (primary) hypertension: Secondary | ICD-10-CM

## 2020-07-05 DIAGNOSIS — I2510 Atherosclerotic heart disease of native coronary artery without angina pectoris: Secondary | ICD-10-CM

## 2020-07-05 DIAGNOSIS — I214 Non-ST elevation (NSTEMI) myocardial infarction: Secondary | ICD-10-CM | POA: Diagnosis not present

## 2020-07-05 LAB — BASIC METABOLIC PANEL
Anion gap: 7 (ref 5–15)
BUN: 21 mg/dL (ref 8–23)
CO2: 31 mmol/L (ref 22–32)
Calcium: 8.5 mg/dL — ABNORMAL LOW (ref 8.9–10.3)
Chloride: 101 mmol/L (ref 98–111)
Creatinine, Ser: 0.98 mg/dL (ref 0.61–1.24)
GFR, Estimated: >60 mL/min (ref >60)
Glucose, Bld: 119 mg/dL — ABNORMAL HIGH (ref 70–99)
Potassium: 4 mmol/L (ref 3.5–5.1)
Sodium: 139 mmol/L (ref 135–145)

## 2020-07-05 LAB — CBC
HCT: 35.2 % — ABNORMAL LOW (ref 39.0–52.0)
Hemoglobin: 11.2 g/dL — ABNORMAL LOW (ref 13.0–17.0)
MCH: 28.7 pg (ref 26.0–34.0)
MCHC: 31.8 g/dL (ref 30.0–36.0)
MCV: 90.3 fL (ref 80.0–100.0)
Platelets: 269 10*3/uL (ref 150–400)
RBC: 3.9 MIL/uL — ABNORMAL LOW (ref 4.22–5.81)
RDW: 14.7 % (ref 11.5–15.5)
WBC: 15.8 10*3/uL — ABNORMAL HIGH (ref 4.0–10.5)
nRBC: 0 % (ref 0.0–0.2)

## 2020-07-05 LAB — HEPARIN LEVEL (UNFRACTIONATED)
Heparin Unfractionated: 0.53 IU/mL (ref 0.30–0.70)
Heparin Unfractionated: 0.53 IU/mL (ref 0.30–0.70)

## 2020-07-05 LAB — MAGNESIUM: Magnesium: 2.1 mg/dL (ref 1.7–2.4)

## 2020-07-05 MED ORDER — SODIUM CHLORIDE 0.9 % WEIGHT BASED INFUSION
3.0000 mL/kg/h | INTRAVENOUS | Status: AC
  Administered 2020-07-06: 3 mL/kg/h via INTRAVENOUS

## 2020-07-05 MED ORDER — FUROSEMIDE 10 MG/ML IJ SOLN
40.0000 mg | Freq: Once | INTRAMUSCULAR | Status: AC
  Administered 2020-07-05: 40 mg via INTRAVENOUS
  Filled 2020-07-05: qty 4

## 2020-07-05 MED ORDER — SODIUM CHLORIDE 0.9% FLUSH
3.0000 mL | Freq: Two times a day (BID) | INTRAVENOUS | Status: DC
  Administered 2020-07-05 – 2020-07-10 (×8): 3 mL via INTRAVENOUS

## 2020-07-05 MED ORDER — SODIUM CHLORIDE 0.9% FLUSH: 3.0000 mL | INTRAVENOUS | Status: DC | PRN

## 2020-07-05 MED ORDER — SODIUM CHLORIDE 0.9 % IV SOLN: 250.0000 mL | INTRAVENOUS | Status: DC | PRN

## 2020-07-05 MED ORDER — SODIUM CHLORIDE 0.9 % WEIGHT BASED INFUSION: 1.0000 mL/kg/h | INTRAVENOUS | Status: DC

## 2020-07-05 NOTE — Plan of Care (Signed)
  Problem: Activity: Goal: Ability to tolerate increased activity will improve Outcome: Progressing   Problem: Education: Goal: Knowledge of General Education information will improve Description: Including pain rating scale, medication(s)/side effects and non-pharmacologic comfort measures Outcome: Progressing   Problem: Health Behavior/Discharge Planning: Goal: Ability to manage health-related needs will improve Outcome: Progressing   Problem: Clinical Measurements: Goal: Will remain free from infection Outcome: Progressing   Problem: Activity: Goal: Risk for activity intolerance will decrease Outcome: Progressing   Problem: Nutrition: Goal: Adequate nutrition will be maintained Outcome: Progressing   Problem: Coping: Goal: Level of anxiety will decrease Outcome: Progressing   Problem: Safety: Goal: Ability to remain free from injury will improve Outcome: Progressing

## 2020-07-05 NOTE — Progress Notes (Signed)
Progress Note  Patient Name: Blake Sutton Date of Encounter: 07/05/2020  [facility] Cardiologist: Candee Furbish, MD new to Candee Furbish, MD   Patient Profile     75 y.o. male with history of hypertension, hyperlipidemia and COPD presenting with ACS/non-STEMI along with acute diastolic heart failure with acute hypoxic respiratory failure. He is ruled in for non-ST elevation MI with troponin up to over 1200.  Subjective   Resting comfortably-lying flat. Doing well.  No further chest pain.  No PND, orthopnea or edema.  Inpatient Medications    Scheduled Meds: . aspirin EC  81 mg Oral Daily  . atorvastatin  80 mg Oral QHS  . buPROPion  100 mg Oral BID  . carvedilol  12.5 mg Oral BID WC  . pantoprazole  40 mg Oral Daily  . tamsulosin  0.4 mg Oral Daily  . umeclidinium bromide  1 puff Inhalation Daily   Continuous Infusions: . heparin 1,600 Units/hr (07/04/20 2132)   PRN Meds: acetaminophen, albuterol, LORazepam, nitroGLYCERIN, zolpidem   Vital Signs    Vitals:   07/04/20 1943 07/04/20 2320 07/05/20 0330 07/05/20 0751  BP: 106/66 91/66 121/88 110/77  Pulse: 79 69 66 70  Resp: 19 14 12    Temp: 98.7 F (37.1 C) 97.7 F (36.5 C) 98 F (36.7 C) 97.8 F (36.6 C)  TempSrc: Oral Oral Oral Oral  SpO2: 93% 95% 93%   Weight:      Height:        Intake/Output Summary (Last 24 hours) at 07/05/2020 0900 Last data filed at 07/05/2020 0751 Gross per 24 hour  Intake 63.03 ml  Output 4775 ml  Net -4711.97 ml   Last 3 Weights 07/04/2020 07/03/2020  Weight (lbs) 186 lb 1.1 oz 185 lb  Weight (kg) 84.4 kg 83.915 kg      Telemetry    Sinus rhythm-rate in 60s.  Rare PVCs..- Personally Reviewed  ECG    Sinus rhythm, rate 63 bpm.  PVC noted.  J-point elevation-early repolarization noted.  Otherwise normal- Personally Reviewed  Physical Exam   GEN:  Resting comfortably, no acute distress.   Neck: No JVD or bruit Cardiac: RRR, normal S1/S2; soft 1/6 HSM @ apex.  No  R/G Respiratory: Clear to auscultation bilaterally.  Nonlabored, good air movement. GI: Soft, nontender, non-distended  MS: No edema; No deformity. Neuro:  Nonfocal  Psych: Normal affect   Labs    High Sensitivity Troponin:   Recent Labs  Lab 07/03/20 2346 07/04/20 0304 07/04/20 0938 07/04/20 1332  TROPONINIHS 367* 1,224* 2,594* 2,193*      Chemistry Recent Labs  Lab 07/03/20 2346 07/04/20 0456 07/05/20 0018  NA 141 138 139  K 3.1* 3.5 4.0  CL 103 105 101  CO2 28 24 31   GLUCOSE 114* 171* 119*  BUN 15 14 21   CREATININE 1.00 0.93 0.98  CALCIUM 8.8* 8.2* 8.5*  PROT 7.0  --   --   ALBUMIN 3.2*  --   --   AST 24  --   --   ALT 24  --   --   ALKPHOS 198*  --   --   BILITOT 0.5  --   --   GFRNONAA >60 >60 >60  ANIONGAP 10 9 7      Hematology Recent Labs  Lab 07/03/20 2346 07/04/20 0456 07/05/20 0018  WBC 14.8* 12.0* 15.8*  RBC 4.69 4.06* 3.90*  HGB 13.7 11.8* 11.2*  HCT 43.1 36.3* 35.2*  MCV 91.9 89.4 90.3  MCH 29.2 29.1 28.7  MCHC 31.8 32.5 31.8  RDW 14.3 14.5 14.7  PLT 335 278 269    BNP Recent Labs  Lab 07/04/20 0456  BNP 1,155.9*     DDimer  Recent Labs  Lab 07/03/20 2346  DDIMER 2.49*     Radiology    CT ANGIO CHEST PE W OR WO CONTRAST  Result Date: 07/04/2020 CLINICAL DATA:  Shortness of breath after vomiting EXAM: CT ANGIOGRAPHY CHEST WITH CONTRAST TECHNIQUE: Multidetector CT imaging of the chest was performed using the standard protocol during bolus administration of intravenous contrast. Multiplanar CT image reconstructions and MIPs were obtained to evaluate the vascular anatomy. CONTRAST:  76mL OMNIPAQUE IOHEXOL 350 MG/ML SOLN COMPARISON:  07/03/2020 FINDINGS: Cardiovascular: This is a technically adequate evaluation of the pulmonary vasculature. No filling defects or pulmonary emboli. No pericardial effusion. Moderate atherosclerosis throughout the coronary vasculature greatest in the LAD distribution. There is aneurysmal dilatation of  the ascending thoracic aorta measuring up to 4.4 cm in diameter. There is moderate diffuse aortic atherosclerosis. Mediastinum/Nodes: Mediastinal adenopathy identified, measuring up to 14 mm in short axis in the AP window. Thyroid, trachea, and esophagus are grossly normal. Lungs/Pleura: There are bilateral pleural effusions, less than 1 L each. Bilateral perihilar ground-glass airspace disease is identified, with basilar predominant interlobular septal thickening. Dependent lower lobe atelectasis. No pneumothorax. The central airways are patent. Upper Abdomen: Small calcifications are seen at the bilateral renal hila, likely vascular. No acute upper abdominal findings. Musculoskeletal: No acute or destructive bony lesions. Reconstructed images demonstrate no additional findings. Review of the MIP images confirms the above findings. IMPRESSION: 1. No evidence of pulmonary embolus. 2. Bilateral perihilar ground-glass airspace disease, basilar predominant interlobular septal thickening, and bilateral pleural effusions as above. Favor fluid overload over infection. 3. Nonspecific mediastinal lymphadenopathy, which may be reactive. 4. 4.4 cm ascending thoracic aortic aneurysm. Recommend annual imaging followup by CTA or MRA. This recommendation follows 2010 ACCF/AHA/AATS/ACR/ASA/SCA/SCAI/SIR/STS/SVM Guidelines for the Diagnosis and Management of Patients with Thoracic Aortic Disease. Circulation. 2010; 121: X793-J030. Aortic aneurysm NOS (ICD10-I71.9) 5.  Aortic Atherosclerosis (ICD10-I70.0). Electronically Signed   By: Randa Ngo M.D.   On: 07/04/2020 03:17   DG Chest Port 1 View  Result Date: 07/04/2020 CLINICAL DATA:  Sepsis, vomiting, short of breath EXAM: PORTABLE CHEST 1 VIEW COMPARISON:  11/27/2014 FINDINGS: 2 frontal views of the chest demonstrate an unremarkable cardiac silhouette. There is diffuse increased interstitial prominence, with bilateral perihilar airspace disease greatest in the lower lung  zones. No effusion or pneumothorax. No acute bony abnormalities. IMPRESSION: 1. Diffuse interstitial prominence with basilar predominant perihilar consolidation. Appearance could reflect multifocal pneumonia, aspiration, or edema. Electronically Signed   By: Randa Ngo M.D.   On: 07/04/2020 00:05   ECHOCARDIOGRAM COMPLETE  Result Date: 07/04/2020    ECHOCARDIOGRAM REPORT   Patient Name:   SUKHMAN MARTINE Date of Exam: 07/04/2020 Medical Rec #:  [id number]       Height:       70.0 in Accession #:    [id number]      Weight:       186.1 lb Date of Birth:  1945-04-14       BSA:          2.024 m Patient Age:    52 years        BP:           133/78 mmHg Patient Gender: M  HR:           90 bpm. Exam Location:  Inpatient Procedure: 2D Echo, Cardiac Doppler and Color Doppler Indications:    NSTEMI  History:        Patient has no prior history of Echocardiogram examinations.                 COPD, Signs/Symptoms:Shortness of Breath and Chest Pain; Risk                 Factors:Hypertension, Dyslipidemia and Current Smoker.                 Pneumonia.  Sonographer:    Dustin Flock Referring Phys: [id number] [place]  1. Hypokinesis of the basal inferolateral wall with overall preserved LV function.  2. Left ventricular ejection fraction, by estimation, is 55 to 60%. The left ventricle has normal function. The left ventricle demonstrates regional wall motion abnormalities (see scoring diagram/findings for description). There is mild left ventricular  hypertrophy. Left ventricular diastolic parameters are indeterminate. Elevated left atrial pressure.  3. Right ventricular systolic function is normal. The right ventricular size is normal. There is mildly elevated pulmonary artery systolic pressure.  4. The mitral valve is normal in structure. Moderate mitral valve regurgitation. No evidence of mitral stenosis.  5. The aortic valve is tricuspid. Aortic valve regurgitation is not visualized.  Mild aortic valve sclerosis is present, with no evidence of aortic valve stenosis.  6. The inferior vena cava is normal in size with greater than 50% respiratory variability, suggesting right atrial pressure of 3 mmHg. FINDINGS  Left Ventricle: Left ventricular ejection fraction, by estimation, is 55 to 60%. The left ventricle has normal function. The left ventricle demonstrates regional wall motion abnormalities. The left ventricular internal cavity size was normal in size. There is mild left ventricular hypertrophy. Left ventricular diastolic parameters are indeterminate. Elevated left atrial pressure. Right Ventricle: The right ventricular size is normal. No increase in right ventricular wall thickness. Right ventricular systolic function is normal. There is mildly elevated pulmonary artery systolic pressure. The tricuspid regurgitant velocity is 2.97  m/s, and with an assumed right atrial pressure of 3 mmHg, the estimated right ventricular systolic pressure is 96.2 mmHg. Left Atrium: Left atrial size was normal in size. Right Atrium: Right atrial size was normal in size. Pericardium: There is no evidence of pericardial effusion. Mitral Valve: The mitral valve is normal in structure. Moderate mitral valve regurgitation. No evidence of mitral valve stenosis. Tricuspid Valve: The tricuspid valve is normal in structure. Tricuspid valve regurgitation is trivial. No evidence of tricuspid stenosis. Aortic Valve: The aortic valve is tricuspid. Aortic valve regurgitation is not visualized. Mild aortic valve sclerosis is present, with no evidence of aortic valve stenosis. Pulmonic Valve: The pulmonic valve was normal in structure. Pulmonic valve regurgitation is not visualized. No evidence of pulmonic stenosis. Aorta: The aortic root is normal in size and structure. Venous: The inferior vena cava is normal in size with greater than 50% respiratory variability, suggesting right atrial pressure of 3 mmHg. IAS/Shunts: No  atrial level shunt detected by color flow Doppler. Additional Comments: Hypokinesis of the basal inferolateral wall with overall preserved LV function.  LEFT VENTRICLE PLAX 2D LVIDd:         5.30 cm  Diastology LVIDs:         3.80 cm  LV e' medial:    5.55 cm/s LV PW:         1.10 cm  LV E/e' medial:  19.6 LV IVS:        1.20 cm  LV e' lateral:   7.51 cm/s LVOT diam:     2.10 cm  LV E/e' lateral: 14.5 LV SV:         85 LV SV Index:   42 LVOT Area:     3.46 cm  RIGHT VENTRICLE RV Basal diam:  3.40 cm RV S prime:     19.90 cm/s TAPSE (M-mode): 3.0 cm LEFT ATRIUM             Index       RIGHT ATRIUM           Index LA diam:        4.30 cm 2.12 cm/m  RA Area:     24.10 cm LA Vol (A2C):   74.1 ml 36.61 ml/m RA Volume:   78.80 ml  38.93 ml/m LA Vol (A4C):   61.4 ml 30.33 ml/m LA Biplane Vol: 67.8 ml 33.49 ml/m  AORTIC VALVE LVOT Vmax:   112.00 cm/s LVOT Vmean:  77.700 cm/s LVOT VTI:    0.245 m  AORTA Ao Root diam: 3.40 cm MITRAL VALVE                TRICUSPID VALVE MV Area (PHT): 5.13 cm     TR Peak grad:   35.3 mmHg MV Decel Time: 148 msec     TR Vmax:        297.00 cm/s MV E velocity: 109.00 cm/s MV A velocity: 67.70 cm/s   SHUNTS MV E/A ratio:  1.61         Systemic VTI:  0.24 m                             Systemic Diam: 2.10 cm Kirk Ruths MD Electronically signed by Kirk Ruths MD Signature Date/Time: 07/04/2020/12:41:24 PM    Final     Cardiac Studies    Echo 07/04/2020: Normal LVEF of 55 to 60% with basal inferolateral HK.  Mild LVH.  Indeterminate diastolic pressures with elevated LAP.  Mildly elevated PAP with normal RAP.Marland Kitchen  Moderate MR.  Mild aortic sclerosis with no stenosis..  Assessment & Plan    Principal Problem:   NSTEMI (non-ST elevated myocardial infarction) ([place]) Active Problems:   Acute respiratory failure with hypoxia (HCC)   Coronary artery calcification seen on computed tomography   Hyperlipidemia LDL goal <70   Essential hypertension   COPD (chronic obstructive pulmonary  disease) (HCC)   Elevated d-dimer   Leukocytosis   CAP (community acquired pneumonia)   Acute diastolic heart failure ([place])  Ascending aortic aneurysm   Principal Problem:   NSTEMI (non-ST elevated myocardial infarction) [facility] Presented with left-sided chest pain at rest--peak troponin now up to 2594.  On heparin drip for ACS   On PPI for GI prophylaxis.  Echo shows basal inferolateral hypokinesis, suspect either RCA or circumflex territory culprit  On aspirin, converted to high-dose atorvastatin  Continue home dose carvedilol  Plan cardiac catheterization on 07/06/2020  Shared Decision Making/Informed Consent The risks [stroke (1 in 1000), death (1 in 1000), kidney failure [usually temporary] (1 in 500), bleeding (1 in 200), allergic reaction [possibly serious] (1 in 200)], benefits (diagnostic support and management of coronary artery disease) and alternatives of a cardiac catheterization were discussed in detail with Mr. Dewolfe and he is willing to proceed.    Coronary artery calcification seen  on computed tomography  In the setting of non-STEMI, this would suggest potentially obstructive CAD.  Will evaluate with cardiac catheterization based on ACS presentation.  Aspirin, high-dose atorvastatin carvedilol     Acute respiratory failure with hypoxia (HCC) / Acute diastolic heart failure (HCC) / COPD (chronic obstructive pulmonary disease) (HCC) -> BNP 1156.  Chest symptoms suggest volume overload over.  Likely related to acute diastolic heart failure from ACS  Dose of IV Lasix 40 mg yesterday    Hyperlipidemia LDL goal <70: Measured LDL 112    Essential hypertension: BP stable on current dose of carvedilol.  Dropped a little bit low overnight, will not titrate further.      Elevated d-dimer: Negative PE protocol CT     CAP (community acquired pneumonia)/ Leukocytosis: Was admitted with acute hypoxic respiratory failure secondary to volume overload.  Lung imaging  consistent with volume overload and not infection.  Procalcitonin was negative.  Was started on prophylactic antibiotics and then discontinued.    Ascending aortic aneurysm: Ascending aorta measured at 4.4 cm on PE protocol CT will need annual follow-up.  Tobacco cessation counseling -> Wellbutrin    For questions or updates, please contact [facility] Please consult [email or web address] for contact info under        Signed, Glenetta Hew, MD  07/05/2020, 9:00 AM

## 2020-07-05 NOTE — Progress Notes (Signed)
[place] for heparin Indication: chest pain/ACS  Allergies  Allergen Reactions  . Oxycodone Nausea And Vomiting  . Pravastatin Itching    Other reaction(s): Xerostomia, Itching    Patient Measurements: Height: 5\' 10"  (177.8 cm) Weight: 84.4 kg (186 lb 1.1 oz) IBW/kg (Calculated) : 73 Heparin Dosing Weight: 83.9 kg Vital Signs: Temp: 97.8 F (36.6 C) (05/30 0751) Temp Source: Oral (05/30 0751) BP: 110/77 (05/30 0751) Pulse Rate: 70 (05/30 0751)  Labs: Recent Labs    07/03/20 2346 07/04/20 0304 07/04/20 0456 07/04/20 8413 07/04/20 0938 07/04/20 1332 07/04/20 1939 07/05/20 0018 07/05/20 0902  HGB 13.7  --  11.8*  --   --   --   --  11.2*  --   HCT 43.1  --  36.3*  --   --   --   --  35.2*  --   PLT 335  --  278  --   --   --   --  269  --   APTT 29  --   --   --   --   --   --   --   --   LABPROT 13.5  --   --   --   --   --   --   --   --   INR 1.0  --   --   --   --   --   --   --   --   HEPARINUNFRC  --   --   --  0.14*   < >  --  0.18* 0.53 0.53  CREATININE 1.00  --  0.93  --   --   --   --  0.98  --   TROPONINIHS 367* 1,224*  --  2,594*  --  2,193*  --   --   --    < > = values in this interval not displayed.    Estimated Creatinine Clearance: 69.3 mL/min (by C-G formula based on SCr of 0.98 mg/dL).   Medical History: Past Medical History:  Diagnosis Date  . COPD (chronic obstructive pulmonary disease) ([place])   . Hypercholesteremia   . Hypertension     Assessment: 75 yo man on heparin for CP/NSTEMI.  He was not on anticoagulation PTA. Plans noted for cath on 5/31  -heparin level at goal, CBC stable  Goal of Therapy:  Heparin level 0.3-0.7 units/ml Monitor platelets by anticoagulation protocol: Yes   Plan:  Continue heparin at 1600 units/hr Daily HL and CBC while on heparin  Hildred Laser, PharmD Clinical Pharmacist **Pharmacist phone directory can now be found on [email or web address] ([email or web address]).  Listed under [place].

## 2020-07-05 NOTE — Progress Notes (Signed)
ANTICOAGULATION CONSULT NOTE - Follow Up Consult  Pharmacy Consult for heparin Indication: NSTEMI  Labs: Recent Labs    07/03/20 2346 07/04/20 0304 07/04/20 0456 07/04/20 0938 07/04/20 1332 07/04/20 1939 07/05/20 0018  HGB 13.7  --  11.8*  --   --   --  11.2*  HCT 43.1  --  36.3*  --   --   --  35.2*  PLT 335  --  278  --   --   --  269  APTT 29  --   --   --   --   --   --   LABPROT 13.5  --   --   --   --   --   --   INR 1.0  --   --   --   --   --   --   HEPARINUNFRC  --   --   --  0.14*  --  0.18* 0.53  CREATININE 1.00  --  0.93  --   --   --  0.98  TROPONINIHS 367* 1,224*  --  2,594* 2,193*  --   --     Assessment/Plan:  75yo male therapeutic on heparin after rate change. Will continue gtt at current rate of 1600 units/hr and confirm stable with additional level.   Wynona Neat, PharmD, BCPS  07/05/2020,1:50 AM

## 2020-07-05 NOTE — Plan of Care (Signed)
  Problem: Education: Goal: Understanding of cardiac disease, CV risk reduction, and recovery process will improve Outcome: Progressing Goal: Understanding of medication regimen will improve Outcome: Progressing   Problem: Cardiac: Goal: Ability to achieve and maintain adequate cardiopulmonary perfusion will improve Outcome: Progressing

## 2020-07-05 NOTE — Progress Notes (Addendum)
PROGRESS NOTE    Blake Sutton  WLN:[id number] DOB: [birth year] DOA: 07/03/2020 PCP: No primary care provider on file.  [id number]/2C04C-01   Assessment & Plan:   Principal Problem:   NSTEMI (non-ST elevated myocardial infarction) ([place]) Active Problems:   Essential hypertension   COPD (chronic obstructive pulmonary disease) (HCC)   Hyperlipidemia LDL goal <70   Elevated d-dimer   Leukocytosis   CAP (community acquired pneumonia)   Coronary artery calcification seen on computed tomography   Acute respiratory failure with hypoxia (HCC)   Acute diastolic heart failure ([place])   Blake Sutton is a 75 y.o. male with medical history significant for HTN, HLD, COPD, presents by EMS with complaint of Chest pain and SOB. Was at home when developed central chest pain and pressure. States it felt like a rock on his sternum and was associated with SOB and nausea. He did not have a cough or fever. He states he has not had swelling of his legs. He has no history of blood clots. States he has not had prolonged immobilization. Denies history of CHF. Denies hx of CAD or cardiac intervention in past.  Continues to smoke occasionally.    NSTEMI (non-ST elevated myocardial infarction) --Initial troponin is elevated at 367, peaked at 2594. --Started on heparin infusion in the ER.  --cardiology consulted --Echo did show LV regional wall motion abnormalities  Plan: --cont heparin gtt --cont coreg (new) --cont ASA 81 and Lipitor --plan for heart cath on Tuesday - defer P2Y12 until after cath - cardiac rehab  Acute hypoxic respiratory failure 2/2 Fluid overload 2/2 acute diastolic heart failure from ACS --CT chest showed fluid overload --BNP 1155 Plan: --cont IV lasix 40 mg BID --Strict I/O    Essential hypertension --BP intermittently soft --cont IV lasix for diuresis --cont coreg --hold home Norvasc and HCTZ for now    COPD  --stable, no wheezing --cont daily inhaler     Hyperlipidemia - cont Lipitor 80     CAP (community acquired pneumonia), ruled out --lung imaging finding more consistent with fluid overload than infection --procal neg --abx d/c'ed    Elevated d-dimer --CTA chest neg for PE    Hypokalemia --replete PRN  Ascending aortic aneurysm --Ascending aorta measured at 4.4 cm on PE protocol CT will need annual follow-up.  Current smoker --cessation advised   DVT prophylaxis: DE:YCXKGYJ gtt Code Status: Full code  Family Communication:  Level of care: Telemetry Cardiac Dispo:   The patient is from: home Anticipated d/c is to: home Anticipated d/c date is: after Tuesday Patient currently is not medically ready to d/c due to: pending heart cath on Tuesday   Subjective and Interval History:  Pt reported breathing better.  Normal oral intake.  Good urine output.  Still on 4L O2.   Objective: Vitals:   07/05/20 0330 07/05/20 0751 07/05/20 1245 07/05/20 1614  BP: 121/88 110/77 116/67   Pulse: 66 70 71   Resp: 12  16   Temp: 98 F (36.7 C) 97.8 F (36.6 C) 98.5 F (36.9 C) 97.8 F (36.6 C)  TempSrc: Oral Oral Oral Oral  SpO2: 93%  99%   Weight:      Height:        Intake/Output Summary (Last 24 hours) at 07/05/2020 1716 Last data filed at 07/05/2020 1052 Gross per 24 hour  Intake --  Output 3575 ml  Net -3575 ml   Filed Weights   07/03/20 2301 07/04/20 0400  Weight: 83.9 kg 84.4 kg  Examination:   Constitutional: NAD, AAOx3 HEENT: conjunctivae and lids normal, EOMI CV: No cyanosis.   RESP: normal respiratory effort, on 4L Extremities: No effusions, edema in BLE SKIN: warm, dry Neuro: II - XII grossly intact.   Psych: Normal mood and affect.  Appropriate judgement and reason   Data Reviewed: I have personally reviewed following labs and imaging studies  CBC: Recent Labs  Lab 07/03/20 2346 07/04/20 0456 07/05/20 0018  WBC 14.8* 12.0* 15.8*  NEUTROABS 10.5*  --   --   HGB 13.7 11.8* 11.2*  HCT  43.1 36.3* 35.2*  MCV 91.9 89.4 90.3  PLT 335 278 382   Basic Metabolic Panel: Recent Labs  Lab 07/03/20 2346 07/04/20 0456 07/05/20 0018  NA 141 138 139  K 3.1* 3.5 4.0  CL 103 105 101  CO2 28 24 31   GLUCOSE 114* 171* 119*  BUN 15 14 21   CREATININE 1.00 0.93 0.98  CALCIUM 8.8* 8.2* 8.5*  MG  --   --  2.1   GFR: Estimated Creatinine Clearance: 69.3 mL/min (by C-G formula based on SCr of 0.98 mg/dL). Liver Function Tests: Recent Labs  Lab 07/03/20 2346  AST 24  ALT 24  ALKPHOS 198*  BILITOT 0.5  PROT 7.0  ALBUMIN 3.2*   No results for input(s): LIPASE, AMYLASE in the last 168 hours. No results for input(s): AMMONIA in the last 168 hours. Coagulation Profile: Recent Labs  Lab 07/03/20 2346  INR 1.0   Cardiac Enzymes: No results for input(s): CKTOTAL, CKMB, CKMBINDEX, TROPONINI in the last 168 hours. BNP (last 3 results) No results for input(s): PROBNP in the last 8760 hours. HbA1C: No results for input(s): HGBA1C in the last 72 hours. CBG: No results for input(s): GLUCAP in the last 168 hours. Lipid Profile: Recent Labs    07/04/20 0456  CHOL 170  HDL 51  LDLCALC 112*  TRIG 35  CHOLHDL 3.3   Thyroid Function Tests: No results for input(s): TSH, T4TOTAL, FREET4, T3FREE, THYROIDAB in the last 72 hours. Anemia Panel: No results for input(s): VITAMINB12, FOLATE, FERRITIN, TIBC, IRON, RETICCTPCT in the last 72 hours. Sepsis Labs: Recent Labs  Lab 07/03/20 2328 07/04/20 0128 07/04/20 0938  PROCALCITON  --   --  <0.10  LATICACIDVEN 1.9 1.3  --     Recent Results (from the past 240 hour(s))  Blood culture (routine single)     Status: None (Preliminary result)   Collection Time: 07/03/20 11:30 PM   Specimen: BLOOD  Result Value Ref Range Status   Specimen Description BLOOD SITE NOT SPECIFIED  Final   Special Requests   Final    BOTTLES DRAWN AEROBIC AND ANAEROBIC Blood Culture adequate volume   Culture   Final    NO GROWTH 2 DAYS Performed at  [facility], [place] [address] [place], [place] [address]    Report Status PENDING  Incomplete  Culture, blood (single)     Status: None (Preliminary result)   Collection Time: 07/03/20 11:30 PM   Specimen: BLOOD  Result Value Ref Range Status   Specimen Description BLOOD SITE NOT SPECIFIED  Final   Special Requests   Final    BOTTLES DRAWN AEROBIC AND ANAEROBIC Blood Culture results may not be optimal due to an inadequate volume of blood received in culture bottles   Culture   Final    NO GROWTH 1 DAY Performed at [facility], [place] [address] [place], [place] [address]    Report Status  PENDING  Incomplete  SARS CORONAVIRUS 2 (TAT 6-24 HRS) Nasopharyngeal Nasopharyngeal Swab     Status: None   Collection Time: 07/03/20 11:31 PM   Specimen: Nasopharyngeal Swab  Result Value Ref Range Status   SARS Coronavirus 2 NEGATIVE NEGATIVE Final    Comment: (NOTE) SARS-CoV-2 target nucleic acids are NOT DETECTED.  The SARS-CoV-2 RNA is generally detectable in upper and lower respiratory specimens during the acute phase of infection. Negative results do not preclude SARS-CoV-2 infection, do not rule out co-infections with other pathogens, and should not be used as the sole basis for treatment or other patient management decisions. Negative results must be combined with clinical observations, patient history, and epidemiological information. The expected result is Negative.  Fact Sheet for Patients: SugarRoll.be  Fact Sheet for Healthcare Providers: https://www.woods-mathews.com/  This test is not yet approved or cleared by the [place] FDA and  has been authorized for detection and/or diagnosis of SARS-CoV-2 by FDA under an Emergency Use Authorization (EUA). This EUA will remain  in effect (meaning this test can be used) for the duration of the COVID-19 declaration under Se ction 564(b)(1) of the Act, 21 U.S.C. section  360bbb-3(b)(1), unless the authorization is terminated or revoked sooner.  Performed at [facility], [place] [address] [place], [place] [address]   Urine culture     Status: Abnormal (Preliminary result)   Collection Time: 07/04/20  2:22 AM   Specimen: In/Out Cath Urine  Result Value Ref Range Status   Specimen Description IN/OUT CATH URINE  Final   Special Requests   Final    NONE Performed at [facility], [place] [address] [place], [place] [address]    Culture >=100,000 COLONIES/mL GRAM NEGATIVE RODS (A)  Final   Report Status PENDING  Incomplete  MRSA PCR Screening     Status: None   Collection Time: 07/04/20  4:46 AM   Specimen: Nasal Mucosa; Nasopharyngeal  Result Value Ref Range Status   MRSA by PCR NEGATIVE NEGATIVE Final    Comment:        The GeneXpert MRSA Assay (FDA approved for NASAL specimens only), is one component of a comprehensive MRSA colonization surveillance program. It is not intended to diagnose MRSA infection nor to guide or monitor treatment for MRSA infections. Performed at [facility], [place] [address], [place], [place] [address]       Radiology Studies: CT ANGIO CHEST PE W OR WO CONTRAST  Result Date: 07/04/2020 CLINICAL DATA:  Shortness of breath after vomiting EXAM: CT ANGIOGRAPHY CHEST WITH CONTRAST TECHNIQUE: Multidetector CT imaging of the chest was performed using the standard protocol during bolus administration of intravenous contrast. Multiplanar CT image reconstructions and MIPs were obtained to evaluate the vascular anatomy. CONTRAST:  59mL OMNIPAQUE IOHEXOL 350 MG/ML SOLN COMPARISON:  07/03/2020 FINDINGS: Cardiovascular: This is a technically adequate evaluation of the pulmonary vasculature. No filling defects or pulmonary emboli. No pericardial effusion. Moderate atherosclerosis throughout the coronary vasculature greatest in the LAD distribution. There is aneurysmal dilatation of the ascending thoracic aorta measuring up  to 4.4 cm in diameter. There is moderate diffuse aortic atherosclerosis. Mediastinum/Nodes: Mediastinal adenopathy identified, measuring up to 14 mm in short axis in the AP window. Thyroid, trachea, and esophagus are grossly normal. Lungs/Pleura: There are bilateral pleural effusions, less than 1 L each. Bilateral perihilar ground-glass airspace disease is identified, with basilar predominant interlobular septal thickening. Dependent lower lobe atelectasis. No pneumothorax. The central airways are patent. Upper Abdomen:  Small calcifications are seen at the bilateral renal hila, likely vascular. No acute upper abdominal findings. Musculoskeletal: No acute or destructive bony lesions. Reconstructed images demonstrate no additional findings. Review of the MIP images confirms the above findings. IMPRESSION: 1. No evidence of pulmonary embolus. 2. Bilateral perihilar ground-glass airspace disease, basilar predominant interlobular septal thickening, and bilateral pleural effusions as above. Favor fluid overload over infection. 3. Nonspecific mediastinal lymphadenopathy, which may be reactive. 4. 4.4 cm ascending thoracic aortic aneurysm. Recommend annual imaging followup by CTA or MRA. This recommendation follows 2010 ACCF/AHA/AATS/ACR/ASA/SCA/SCAI/SIR/STS/SVM Guidelines for the Diagnosis and Management of Patients with Thoracic Aortic Disease. Circulation. 2010; 121: O962-X528. Aortic aneurysm NOS (ICD10-I71.9) 5.  Aortic Atherosclerosis (ICD10-I70.0). Electronically Signed   By: Randa Ngo M.D.   On: 07/04/2020 03:17   DG Chest Port 1 View  Result Date: 07/04/2020 CLINICAL DATA:  Sepsis, vomiting, short of breath EXAM: PORTABLE CHEST 1 VIEW COMPARISON:  11/27/2014 FINDINGS: 2 frontal views of the chest demonstrate an unremarkable cardiac silhouette. There is diffuse increased interstitial prominence, with bilateral perihilar airspace disease greatest in the lower lung zones. No effusion or pneumothorax. No acute  bony abnormalities. IMPRESSION: 1. Diffuse interstitial prominence with basilar predominant perihilar consolidation. Appearance could reflect multifocal pneumonia, aspiration, or edema. Electronically Signed   By: Randa Ngo M.D.   On: 07/04/2020 00:05   ECHOCARDIOGRAM COMPLETE  Result Date: 07/04/2020    ECHOCARDIOGRAM REPORT   Patient Name:   Blake Sutton Date of Exam: 07/04/2020 Medical Rec #:  [id number]       Height:       70.0 in Accession #:    [id number]      Weight:       186.1 lb Date of Birth:  02-Aug-1945       BSA:          2.024 m Patient Age:    63 years        BP:           133/78 mmHg Patient Gender: M               HR:           90 bpm. Exam Location:  Inpatient Procedure: 2D Echo, Cardiac Doppler and Color Doppler Indications:    NSTEMI  History:        Patient has no prior history of Echocardiogram examinations.                 COPD, Signs/Symptoms:Shortness of Breath and Chest Pain; Risk                 Factors:Hypertension, Dyslipidemia and Current Smoker.                 Pneumonia.  Sonographer:    Dustin Flock Referring Phys: [id number] [place]  1. Hypokinesis of the basal inferolateral wall with overall preserved LV function.  2. Left ventricular ejection fraction, by estimation, is 55 to 60%. The left ventricle has normal function. The left ventricle demonstrates regional wall motion abnormalities (see scoring diagram/findings for description). There is mild left ventricular  hypertrophy. Left ventricular diastolic parameters are indeterminate. Elevated left atrial pressure.  3. Right ventricular systolic function is normal. The right ventricular size is normal. There is mildly elevated pulmonary artery systolic pressure.  4. The mitral valve is normal in structure. Moderate mitral valve regurgitation. No evidence of mitral stenosis.  5. The aortic valve is tricuspid. Aortic valve regurgitation is not  visualized. Mild aortic valve sclerosis is present, with  no evidence of aortic valve stenosis.  6. The inferior vena cava is normal in size with greater than 50% respiratory variability, suggesting right atrial pressure of 3 mmHg. FINDINGS  Left Ventricle: Left ventricular ejection fraction, by estimation, is 55 to 60%. The left ventricle has normal function. The left ventricle demonstrates regional wall motion abnormalities. The left ventricular internal cavity size was normal in size. There is mild left ventricular hypertrophy. Left ventricular diastolic parameters are indeterminate. Elevated left atrial pressure. Right Ventricle: The right ventricular size is normal. No increase in right ventricular wall thickness. Right ventricular systolic function is normal. There is mildly elevated pulmonary artery systolic pressure. The tricuspid regurgitant velocity is 2.97  m/s, and with an assumed right atrial pressure of 3 mmHg, the estimated right ventricular systolic pressure is 97.6 mmHg. Left Atrium: Left atrial size was normal in size. Right Atrium: Right atrial size was normal in size. Pericardium: There is no evidence of pericardial effusion. Mitral Valve: The mitral valve is normal in structure. Moderate mitral valve regurgitation. No evidence of mitral valve stenosis. Tricuspid Valve: The tricuspid valve is normal in structure. Tricuspid valve regurgitation is trivial. No evidence of tricuspid stenosis. Aortic Valve: The aortic valve is tricuspid. Aortic valve regurgitation is not visualized. Mild aortic valve sclerosis is present, with no evidence of aortic valve stenosis. Pulmonic Valve: The pulmonic valve was normal in structure. Pulmonic valve regurgitation is not visualized. No evidence of pulmonic stenosis. Aorta: The aortic root is normal in size and structure. Venous: The inferior vena cava is normal in size with greater than 50% respiratory variability, suggesting right atrial pressure of 3 mmHg. IAS/Shunts: No atrial level shunt detected by color flow  Doppler. Additional Comments: Hypokinesis of the basal inferolateral wall with overall preserved LV function.  LEFT VENTRICLE PLAX 2D LVIDd:         5.30 cm  Diastology LVIDs:         3.80 cm  LV e' medial:    5.55 cm/s LV PW:         1.10 cm  LV E/e' medial:  19.6 LV IVS:        1.20 cm  LV e' lateral:   7.51 cm/s LVOT diam:     2.10 cm  LV E/e' lateral: 14.5 LV SV:         85 LV SV Index:   42 LVOT Area:     3.46 cm  RIGHT VENTRICLE RV Basal diam:  3.40 cm RV S prime:     19.90 cm/s TAPSE (M-mode): 3.0 cm LEFT ATRIUM             Index       RIGHT ATRIUM           Index LA diam:        4.30 cm 2.12 cm/m  RA Area:     24.10 cm LA Vol (A2C):   74.1 ml 36.61 ml/m RA Volume:   78.80 ml  38.93 ml/m LA Vol (A4C):   61.4 ml 30.33 ml/m LA Biplane Vol: 67.8 ml 33.49 ml/m  AORTIC VALVE LVOT Vmax:   112.00 cm/s LVOT Vmean:  77.700 cm/s LVOT VTI:    0.245 m  AORTA Ao Root diam: 3.40 cm MITRAL VALVE                TRICUSPID VALVE MV Area (PHT): 5.13 cm     TR Peak grad:  35.3 mmHg MV Decel Time: 148 msec     TR Vmax:        297.00 cm/s MV E velocity: 109.00 cm/s MV A velocity: 67.70 cm/s   SHUNTS MV E/A ratio:  1.61         Systemic VTI:  0.24 m                             Systemic Diam: 2.10 cm Kirk Ruths MD Electronically signed by Kirk Ruths MD Signature Date/Time: 07/04/2020/12:41:24 PM    Final      Scheduled Meds: . aspirin EC  81 mg Oral Daily  . atorvastatin  80 mg Oral QHS  . buPROPion  100 mg Oral BID  . carvedilol  12.5 mg Oral BID WC  . pantoprazole  40 mg Oral Daily  . sodium chloride flush  3 mL Intravenous Q12H  . tamsulosin  0.4 mg Oral Daily  . umeclidinium bromide  1 puff Inhalation Daily   Continuous Infusions: . sodium chloride    . [START ON 07/06/2020] sodium chloride     Followed by  . [START ON 07/06/2020] sodium chloride    . heparin 1,600 Units/hr (07/05/20 0916)     LOS: 1 day     Enzo Bi, MD [facility] If 7PM-7AM, please contact  night-coverage 07/05/2020, 5:16 PM

## 2020-07-06 ENCOUNTER — Encounter (HOSPITAL_COMMUNITY): Payer: Self-pay | Admitting: Interventional Cardiology

## 2020-07-06 ENCOUNTER — Encounter (HOSPITAL_COMMUNITY)
Admission: EM | Disposition: A | Discharge status: Home / Self Care | Payer: Self-pay | Source: Home / Self Care | Attending: Thoracic Surgery (Cardiothoracic Vascular Surgery)

## 2020-07-06 DIAGNOSIS — I2510 Atherosclerotic heart disease of native coronary artery without angina pectoris: Secondary | ICD-10-CM | POA: Diagnosis not present

## 2020-07-06 DIAGNOSIS — J449 Chronic obstructive pulmonary disease, unspecified: Secondary | ICD-10-CM

## 2020-07-06 DIAGNOSIS — I25110 Atherosclerotic heart disease of native coronary artery with unstable angina pectoris: Secondary | ICD-10-CM

## 2020-07-06 DIAGNOSIS — I214 Non-ST elevation (NSTEMI) myocardial infarction: Secondary | ICD-10-CM

## 2020-07-06 HISTORY — PX: LEFT HEART CATH AND CORONARY ANGIOGRAPHY: CATH118249

## 2020-07-06 LAB — URINE CULTURE: Culture: >=100000 — AB

## 2020-07-06 LAB — HEMOGLOBIN A1C
Hgb A1c MFr Bld: 5.9 % — ABNORMAL HIGH (ref 4.8–5.6)
Mean Plasma Glucose: 123 mg/dL

## 2020-07-06 LAB — CBC
HCT: 36 % — ABNORMAL LOW (ref 39.0–52.0)
Hemoglobin: 11.4 g/dL — ABNORMAL LOW (ref 13.0–17.0)
MCH: 28.5 pg (ref 26.0–34.0)
MCHC: 31.7 g/dL (ref 30.0–36.0)
MCV: 90 fL (ref 80.0–100.0)
Platelets: 310 10*3/uL (ref 150–400)
RBC: 4 MIL/uL — ABNORMAL LOW (ref 4.22–5.81)
RDW: 14.6 % (ref 11.5–15.5)
WBC: 12 10*3/uL — ABNORMAL HIGH (ref 4.0–10.5)
nRBC: 0 % (ref 0.0–0.2)

## 2020-07-06 LAB — MAGNESIUM: Magnesium: 2.1 mg/dL (ref 1.7–2.4)

## 2020-07-06 LAB — BASIC METABOLIC PANEL
Anion gap: 10 (ref 5–15)
BUN: 26 mg/dL — ABNORMAL HIGH (ref 8–23)
CO2: 30 mmol/L (ref 22–32)
Calcium: 8.6 mg/dL — ABNORMAL LOW (ref 8.9–10.3)
Chloride: 99 mmol/L (ref 98–111)
Creatinine, Ser: 1.03 mg/dL (ref 0.61–1.24)
GFR, Estimated: >60 mL/min (ref >60)
Glucose, Bld: 118 mg/dL — ABNORMAL HIGH (ref 70–99)
Potassium: 3.5 mmol/L (ref 3.5–5.1)
Sodium: 139 mmol/L (ref 135–145)

## 2020-07-06 LAB — HEPARIN LEVEL (UNFRACTIONATED): Heparin Unfractionated: 0.3 IU/mL (ref 0.30–0.70)

## 2020-07-06 SURGERY — LEFT HEART CATH AND CORONARY ANGIOGRAPHY
Anesthesia: LOCAL

## 2020-07-06 MED ORDER — IOHEXOL 350 MG/ML SOLN
INTRAVENOUS | Status: DC | PRN
  Administered 2020-07-06: 43 mL

## 2020-07-06 MED ORDER — HEPARIN SODIUM (PORCINE) 1000 UNIT/ML IJ SOLN
INTRAMUSCULAR | Status: DC | PRN
  Administered 2020-07-06: 4500 [IU] via INTRAVENOUS

## 2020-07-06 MED ORDER — MIDAZOLAM HCL 2 MG/2ML IJ SOLN
INTRAMUSCULAR | Status: DC | PRN
  Administered 2020-07-06: 1 mg via INTRAVENOUS

## 2020-07-06 MED ORDER — SODIUM CHLORIDE 0.9% FLUSH
3.0000 mL | INTRAVENOUS | Status: DC | PRN
Start: 2020-07-06 — End: 2020-07-13

## 2020-07-06 MED ORDER — VERAPAMIL HCL 2.5 MG/ML IV SOLN
INTRAVENOUS | Status: DC | PRN
  Administered 2020-07-06: 10 mL via INTRA_ARTERIAL

## 2020-07-06 MED ORDER — VERAPAMIL HCL 2.5 MG/ML IV SOLN
INTRAVENOUS | Status: AC
  Filled 2020-07-06: qty 2

## 2020-07-06 MED ORDER — SODIUM CHLORIDE 0.9 % IV SOLN: INTRAVENOUS | Status: AC

## 2020-07-06 MED ORDER — FENTANYL CITRATE (PF) 100 MCG/2ML IJ SOLN
INTRAMUSCULAR | Status: DC | PRN
  Administered 2020-07-06: 25 ug via INTRAVENOUS

## 2020-07-06 MED ORDER — FLUOXETINE HCL 20 MG PO CAPS
20.0000 mg | ORAL_CAPSULE | Freq: Every day | ORAL | Status: DC
  Administered 2020-07-06 – 2020-07-13 (×7): 20 mg via ORAL
  Filled 2020-07-06 (×7): qty 1

## 2020-07-06 MED ORDER — SODIUM CHLORIDE 0.9 % IV SOLN: 250.0000 mL | INTRAVENOUS | Status: DC | PRN

## 2020-07-06 MED ORDER — SODIUM CHLORIDE 0.9 % IV SOLN
INTRAVENOUS | Status: AC | PRN
  Administered 2020-07-06: 250 mL via INTRAVENOUS

## 2020-07-06 MED ORDER — HEPARIN (PORCINE) 25000 UT/250ML-% IV SOLN
1600.0000 [IU]/h | INTRAVENOUS | Status: DC
  Administered 2020-07-06: 1500 [IU]/h via INTRAVENOUS
  Administered 2020-07-07: 1600 [IU]/h via INTRAVENOUS
  Administered 2020-07-07: 1500 [IU]/h via INTRAVENOUS
  Filled 2020-07-06 (×2): qty 250

## 2020-07-06 MED ORDER — HEPARIN SODIUM (PORCINE) 1000 UNIT/ML IJ SOLN
INTRAMUSCULAR | Status: AC
  Filled 2020-07-06: qty 1

## 2020-07-06 MED ORDER — LABETALOL HCL 5 MG/ML IV SOLN: 10.0000 mg | INTRAVENOUS | Status: AC | PRN

## 2020-07-06 MED ORDER — POTASSIUM CHLORIDE CRYS ER 20 MEQ PO TBCR
40.0000 meq | EXTENDED_RELEASE_TABLET | Freq: Once | ORAL | Status: AC
  Administered 2020-07-06: 40 meq via ORAL
  Filled 2020-07-06: qty 2

## 2020-07-06 MED ORDER — ACETAMINOPHEN 325 MG PO TABS: 650.0000 mg | ORAL_TABLET | ORAL | Status: DC | PRN

## 2020-07-06 MED ORDER — HEPARIN (PORCINE) IN NACL 1000-0.9 UT/500ML-% IV SOLN
INTRAVENOUS | Status: AC
  Filled 2020-07-06: qty 1000

## 2020-07-06 MED ORDER — HEPARIN (PORCINE) IN NACL 1000-0.9 UT/500ML-% IV SOLN
INTRAVENOUS | Status: DC | PRN
  Administered 2020-07-06 (×2): 500 mL

## 2020-07-06 MED ORDER — MIDAZOLAM HCL 2 MG/2ML IJ SOLN
INTRAMUSCULAR | Status: AC
  Filled 2020-07-06: qty 2

## 2020-07-06 MED ORDER — HYDRALAZINE HCL 20 MG/ML IJ SOLN: 10.0000 mg | INTRAMUSCULAR | Status: AC | PRN

## 2020-07-06 MED ORDER — FENTANYL CITRATE (PF) 100 MCG/2ML IJ SOLN
INTRAMUSCULAR | Status: AC
  Filled 2020-07-06: qty 2

## 2020-07-06 MED ORDER — LIDOCAINE HCL (PF) 1 % IJ SOLN
INTRAMUSCULAR | Status: AC
  Filled 2020-07-06: qty 30

## 2020-07-06 MED ORDER — LIDOCAINE HCL (PF) 1 % IJ SOLN
INTRAMUSCULAR | Status: DC | PRN
  Administered 2020-07-06: 2 mL

## 2020-07-06 MED ORDER — NITROGLYCERIN 1 MG/10 ML FOR IR/CATH LAB
INTRA_ARTERIAL | Status: AC
  Filled 2020-07-06: qty 10

## 2020-07-06 MED ORDER — TRAZODONE HCL 50 MG PO TABS
50.0000 mg | ORAL_TABLET | Freq: Every day | ORAL | Status: DC
  Administered 2020-07-06 – 2020-07-12 (×7): 50 mg via ORAL
  Filled 2020-07-06 (×7): qty 1

## 2020-07-06 MED ORDER — SODIUM CHLORIDE 0.9% FLUSH
3.0000 mL | Freq: Two times a day (BID) | INTRAVENOUS | Status: DC
  Administered 2020-07-06 – 2020-07-11 (×8): 3 mL via INTRAVENOUS

## 2020-07-06 SURGICAL SUPPLY — 9 items

## 2020-07-06 NOTE — Consult Note (Addendum)
[address]       [place], [address]             [phone]        Jeyren R Kirker Wallace Medical Record #[id number] Date of Birth: [birth year]  Referring: No ref. provider found Primary Care: No primary care provider on file. Primary Cardiologist:Mark Marlou Porch, MD  Chief Complaint:    Chief Complaint  Patient presents with  . Respiratory Distress    History of Present Illness:      Mr. Ameya Kutz is a 75 year old male patient with a past medical history significant for hypertension, hyperlipidemia, COPD, and current everyday smoker who had an episode of chest pressure with shortness of breath and nausea on Saturday 5/28. This was his first episode of chest pain. EMS arrived and placed oxygen on him. In the ER he remained hemodynamically stable with an elevated D-dimer. He underwent a CT Angio of the chest which showed no evidence of PE. Ascending thoracic aortic aneurysm and Aortic atherosclerosis was noted. Due to the nature of his symptoms and his history of smoking he underwent a cardiac catheterization today which showed ostial to proximal LAD disease which is 60%, mid LAD stenosis of 90% and 50% respectively. There is a 99% stenosis of the left circumflex with a 75% stenosis of the first obtuse marginal branch. He also has hypokinesis of the basal inferolateral wall with overall preserved LV function of 55-60%. There is moderate mitral valve regurgitation with no other significant valvular disease. We are consulted for possible surgical intervention for his coronary artery disease.    Current Activity/ Functional Status: Patient was independent with mobility/ambulation, transfers, ADL's, IADL's.   Zubrod Score: At the time of surgery this patient's most appropriate activity status/level should be described as: []     0    Normal activity, no symptoms [x]     1    Restricted in physical strenuous activity but ambulatory, able to do out light work []      2    Ambulatory and capable of self care, unable to do work activities, up and about                 more than 50%  Of the time                            []     3    Only limited self care, in bed greater than 50% of waking hours []     4    Completely disabled, no self care, confined to bed or chair []     5    Moribund  Past Medical History:  Diagnosis Date  . COPD (chronic obstructive pulmonary disease) ([place])   . Hypercholesteremia   . Hypertension     Past Surgical History:  Procedure Laterality Date  . ABDOMINAL SURGERY     "repair bleeding ulcers"  . NECK SURGERY    . ROTATOR CUFF REPAIR      Social History   Tobacco Use  Smoking Status Current Every Day Smoker  Smokeless Tobacco Not on file    Social History   Substance and Sexual Activity  Alcohol Use No     Allergies  Allergen Reactions  . Oxycodone Nausea And Vomiting  . Pravastatin Itching    Other reaction(s): Xerostomia, Itching    Current Facility-Administered Medications  Medication Dose Route Frequency Provider Last Rate  Last Admin  . 0.9 %  sodium chloride infusion   Intravenous Continuous Larae Grooms S, MD      . 0.9 %  sodium chloride infusion  250 mL Intravenous PRN Jettie Booze, MD      . acetaminophen (TYLENOL) tablet 650 mg  650 mg Oral Q4H PRN Chotiner, Yevonne Aline, MD      . acetaminophen (TYLENOL) tablet 650 mg  650 mg Oral Q4H PRN Jettie Booze, MD      . albuterol (PROVENTIL) (2.5 MG/3ML) 0.083% nebulizer solution 2.5 mg  2.5 mg Inhalation Q4H PRN Jettie Booze, MD      . aspirin EC tablet 81 mg  81 mg Oral Daily Jettie Booze, MD   81 mg at 07/06/20 9381  . atorvastatin (LIPITOR) tablet 80 mg  80 mg Oral QHS Jettie Booze, MD   80 mg at 07/05/20 2135  . buPROPion [facility] SR) 12 hr tablet 100 mg  100 mg Oral BID Jettie Booze, MD   100 mg at 07/05/20 2135  . carvedilol (COREG) tablet 12.5 mg  12.5 mg Oral BID WC Jettie Booze, MD    12.5 mg at 07/06/20 8299  . FLUoxetine (PROZAC) capsule 20 mg  20 mg Oral Daily Jettie Booze, MD      . heparin ADULT infusion 100 units/mL (25000 units/239mL)  1,600 Units/hr Intravenous Continuous Lavenia Atlas, RPH 16 mL/hr at 07/06/20 0056 1,600 Units/hr at 07/06/20 0056  . hydrALAZINE (APRESOLINE) injection 10 mg  10 mg Intravenous Q20 Min PRN Jettie Booze, MD      . labetalol (NORMODYNE) injection 10 mg  10 mg Intravenous Q10 min PRN Jettie Booze, MD      . LORazepam (ATIVAN) tablet 0.5 mg  0.5 mg Oral QHS PRN Jettie Booze, MD   0.5 mg at 07/05/20 2136  . nitroGLYCERIN (NITROSTAT) SL tablet 0.4 mg  0.4 mg Sublingual Q5 Min x 3 PRN Jettie Booze, MD      . pantoprazole (PROTONIX) EC tablet 40 mg  40 mg Oral Daily Jettie Booze, MD   40 mg at 07/06/20 3716  . sodium chloride flush (NS) 0.9 % injection 3 mL  3 mL Intravenous Q12H Jettie Booze, MD   3 mL at 07/05/20 2200  . sodium chloride flush (NS) 0.9 % injection 3 mL  3 mL Intravenous Q12H Larae Grooms S, MD      . sodium chloride flush (NS) 0.9 % injection 3 mL  3 mL Intravenous PRN Jettie Booze, MD      . tamsulosin [facility]) capsule 0.4 mg  0.4 mg Oral Daily Jettie Booze, MD   0.4 mg at 07/06/20 9678  . traZODone (DESYREL) tablet 50 mg  50 mg Oral QHS Larae Grooms S, MD      . umeclidinium bromide (INCRUSE ELLIPTA) 62.5 MCG/INH 1 puff  1 puff Inhalation Daily Jettie Booze, MD   1 puff at 07/05/20 1050  . zolpidem (AMBIEN) tablet 5 mg  5 mg Oral QHS PRN Jettie Booze, MD        Medications Prior to Admission  Medication Sig Dispense Refill Last Dose  . albuterol (PROVENTIL HFA;VENTOLIN HFA) 108 (90 BASE) MCG/ACT inhaler Inhale 1-2 puffs into the lungs every 6 (six) hours as needed for wheezing or shortness of breath.   07/03/2020 at Unknown time  . amLODipine (NORVASC) 5 MG tablet Take 5 mg by mouth daily.  07/03/2020 at Unknown time  .  atenolol (TENORMIN) 25 MG tablet Take 25 mg by mouth daily.   07/03/2020 at 0630  . buPROPion (WELLBUTRIN SR) 100 MG 12 hr tablet Take 100 mg by mouth 2 (two) times daily.   07/03/2020 at Unknown time  . celecoxib (CELEBREX) 200 MG capsule Take 200 mg by mouth daily.    07/03/2020 at Unknown time  . FLUoxetine (PROZAC) 20 MG capsule Take 20 mg by mouth daily.   07/03/2020 at Unknown time  . hydrochlorothiazide (HYDRODIURIL) 25 MG tablet Take 25 mg by mouth daily.   07/03/2020 at Unknown time  . HYDROcodone-acetaminophen (NORCO/VICODIN) 5-325 MG per tablet Take 1 tablet by mouth every 6 (six) hours as needed (pain).    unk  . meloxicam (MOBIC) 15 MG tablet Take 15 mg by mouth daily as needed for pain.   Past Week at Unknown time  . omeprazole (PRILOSEC) 20 MG capsule Take 20 mg by mouth daily.   07/03/2020 at Unknown time  . sildenafil (VIAGRA) 100 MG tablet Take 100 mg by mouth as needed for erectile dysfunction.   unk  . simvastatin (ZOCOR) 40 MG tablet Take 40 mg by mouth at bedtime.   07/02/2020  . tamsulosin (FLOMAX) 0.4 MG CAPS capsule Take 0.4 mg by mouth daily.   07/03/2020 at Unknown time  . traZODone (DESYREL) 50 MG tablet Take 50 mg by mouth at bedtime.   07/02/2020    History reviewed. No pertinent family history.   Review of Systems:   Review of Systems  Respiratory: Positive for shortness of breath.   Cardiovascular: Positive for chest pain. Negative for palpitations and leg swelling.  Gastrointestinal: Positive for nausea. Negative for abdominal pain.   Pertinent items are noted in HPI.   Physical Exam: BP 105/70   Pulse (!) 0   Temp 98.1 F (36.7 C) (Oral)   Resp (!) 0   Ht 5\' 10"  (1.778 m)   Wt 84.4 kg   SpO2 (!) 0%   BMI 26.70 kg/m    General appearance: alert, cooperative and no distress Resp: clear to auscultation bilaterally Cardio: regular rate and rhythm, S1, S2 normal, no murmur, click, rub or gallop GI: soft, non-tender; bowel sounds normal; no masses,  no  organomegaly Extremities: extremities normal, atraumatic, no cyanosis or edema Neurologic: Grossly normal  Diagnostic Studies & Laboratory data:     Recent Radiology Findings:   CARDIAC CATHETERIZATION  Result Date: 07/06/2020  1st Mrg lesion is 75% stenosed.  Mid Cx lesion is 99% stenosed.  Ost LAD to Prox LAD lesion is 60% stenosed.  Mid LAD lesion is 90% stenosed. Ectatic area in the mid LAD at the end of the stenosis.  Mid LAD-2 lesion is 50% stenosed.  LV end diastolic pressure is normal.  There is no aortic valve stenosis.  Left dominant circulation.   Calcific OM1 and LAD disease.  Moderate ostial LAD disease.  Culprit was lesion in the circumflex that was heavily thrombotic with high risk of distal embolization if PCI attempted.  Continue anticoagulation.  Cardiac surgery consult.  Consider GP IIb/IIIa inhibitor depending on timing of the surgery.   ECHOCARDIOGRAM COMPLETE  Result Date: 07/04/2020    ECHOCARDIOGRAM REPORT   Patient Name:   MALEIK VANDERZEE Date of Exam: 07/04/2020 Medical Rec #:  [id number]       Height:       70.0 in Accession #:    [id number]      Weight:  186.1 lb Date of Birth:  Jul 31, 1945       BSA:          2.024 m Patient Age:    32 years        BP:           133/78 mmHg Patient Gender: M               HR:           90 bpm. Exam Location:  Inpatient Procedure: 2D Echo, Cardiac Doppler and Color Doppler Indications:    NSTEMI  History:        Patient has no prior history of Echocardiogram examinations.                 COPD, Signs/Symptoms:Shortness of Breath and Chest Pain; Risk                 Factors:Hypertension, Dyslipidemia and Current Smoker.                 Pneumonia.  Sonographer:    Dustin Flock Referring Phys: [id number] [place]  1. Hypokinesis of the basal inferolateral wall with overall preserved LV function.  2. Left ventricular ejection fraction, by estimation, is 55 to 60%. The left ventricle has normal function. The left  ventricle demonstrates regional wall motion abnormalities (see scoring diagram/findings for description). There is mild left ventricular  hypertrophy. Left ventricular diastolic parameters are indeterminate. Elevated left atrial pressure.  3. Right ventricular systolic function is normal. The right ventricular size is normal. There is mildly elevated pulmonary artery systolic pressure.  4. The mitral valve is normal in structure. Moderate mitral valve regurgitation. No evidence of mitral stenosis.  5. The aortic valve is tricuspid. Aortic valve regurgitation is not visualized. Mild aortic valve sclerosis is present, with no evidence of aortic valve stenosis.  6. The inferior vena cava is normal in size with greater than 50% respiratory variability, suggesting right atrial pressure of 3 mmHg. FINDINGS  Left Ventricle: Left ventricular ejection fraction, by estimation, is 55 to 60%. The left ventricle has normal function. The left ventricle demonstrates regional wall motion abnormalities. The left ventricular internal cavity size was normal in size. There is mild left ventricular hypertrophy. Left ventricular diastolic parameters are indeterminate. Elevated left atrial pressure. Right Ventricle: The right ventricular size is normal. No increase in right ventricular wall thickness. Right ventricular systolic function is normal. There is mildly elevated pulmonary artery systolic pressure. The tricuspid regurgitant velocity is 2.97  m/s, and with an assumed right atrial pressure of 3 mmHg, the estimated right ventricular systolic pressure is 66.2 mmHg. Left Atrium: Left atrial size was normal in size. Right Atrium: Right atrial size was normal in size. Pericardium: There is no evidence of pericardial effusion. Mitral Valve: The mitral valve is normal in structure. Moderate mitral valve regurgitation. No evidence of mitral valve stenosis. Tricuspid Valve: The tricuspid valve is normal in structure. Tricuspid valve  regurgitation is trivial. No evidence of tricuspid stenosis. Aortic Valve: The aortic valve is tricuspid. Aortic valve regurgitation is not visualized. Mild aortic valve sclerosis is present, with no evidence of aortic valve stenosis. Pulmonic Valve: The pulmonic valve was normal in structure. Pulmonic valve regurgitation is not visualized. No evidence of pulmonic stenosis. Aorta: The aortic root is normal in size and structure. Venous: The inferior vena cava is normal in size with greater than 50% respiratory variability, suggesting right atrial pressure of 3 mmHg. IAS/Shunts: No atrial level  shunt detected by color flow Doppler. Additional Comments: Hypokinesis of the basal inferolateral wall with overall preserved LV function.  LEFT VENTRICLE PLAX 2D LVIDd:         5.30 cm  Diastology LVIDs:         3.80 cm  LV e' medial:    5.55 cm/s LV PW:         1.10 cm  LV E/e' medial:  19.6 LV IVS:        1.20 cm  LV e' lateral:   7.51 cm/s LVOT diam:     2.10 cm  LV E/e' lateral: 14.5 LV SV:         85 LV SV Index:   42 LVOT Area:     3.46 cm  RIGHT VENTRICLE RV Basal diam:  3.40 cm RV S prime:     19.90 cm/s TAPSE (M-mode): 3.0 cm LEFT ATRIUM             Index       RIGHT ATRIUM           Index LA diam:        4.30 cm 2.12 cm/m  RA Area:     24.10 cm LA Vol (A2C):   74.1 ml 36.61 ml/m RA Volume:   78.80 ml  38.93 ml/m LA Vol (A4C):   61.4 ml 30.33 ml/m LA Biplane Vol: 67.8 ml 33.49 ml/m  AORTIC VALVE LVOT Vmax:   112.00 cm/s LVOT Vmean:  77.700 cm/s LVOT VTI:    0.245 m  AORTA Ao Root diam: 3.40 cm MITRAL VALVE                TRICUSPID VALVE MV Area (PHT): 5.13 cm     TR Peak grad:   35.3 mmHg MV Decel Time: 148 msec     TR Vmax:        297.00 cm/s MV E velocity: 109.00 cm/s MV A velocity: 67.70 cm/s   SHUNTS MV E/A ratio:  1.61         Systemic VTI:  0.24 m                             Systemic Diam: 2.10 cm Kirk Ruths MD Electronically signed by Kirk Ruths MD Signature Date/Time: 07/04/2020/12:41:24 PM     Final      I have independently reviewed the above radiologic studies and discussed with the patient   Recent Lab Findings: Lab Results  Component Value Date   WBC 12.0 (H) 07/06/2020   HGB 11.4 (L) 07/06/2020   HCT 36.0 (L) 07/06/2020   PLT 310 07/06/2020   GLUCOSE 118 (H) 07/06/2020   CHOL 170 07/04/2020   TRIG 35 07/04/2020   HDL 51 07/04/2020   LDLCALC 112 (H) 07/04/2020   ALT 24 07/03/2020   AST 24 07/03/2020   NA 139 07/06/2020   K 3.5 07/06/2020   CL 99 07/06/2020   CREATININE 1.03 07/06/2020   BUN 26 (H) 07/06/2020   CO2 30 07/06/2020   INR 1.0 07/03/2020   HGBA1C 5.9 (H) 07/05/2020      Assessment / Plan:      1. CAD/NSTEMI- no chest pain currently. Continue asa, statin, and coreg. Continue nitro PRN for chest pain. Plan for CABG this week.   2. Hypertension- PRN hydralazine and labetalol 3. Hyperlipidemia-continue statin therapy 4. Tobacco abuse-smoking cessation recommended. The patient did not realize he had COPD. He  is on PRN albuterol inhaler at home. Incruse Ellipta started here.  5. Ascending Aortic Aneurysm- 4.4cm on PE protocol. Elevated d-dimer, Negative PE protocol   Plan: Reviewed coronary artery bypass grafting with the patient. He will need pulmonary function testing prior to surgery. No previous surgery on his legs and they seem appropriate for obtaining conduit. Currently on 2L [place] oxygen with good saturation. All questions answered to the patient's satisfaction. Dr. Kipp Brood to discuss timing of surgery. He was not on any anticoagulation at home.    I  spent 30 minutes counseling the patient face to face.   Nicholes Rough, PA-C 07/06/2020 11:19 AM   Agree with above This is a 75 yo male that presents with an NSTEMI.  LHC revealed a nondominant right system, and 2V CAD.  Echo shows preserved RV and LV function.  He does have moderate MR, and the jet is mostly central.  He is tentatively scheduled for CABG 3 on 6/2.  The MR is likely ischemic, and  will be watched for now.  Jamillia Closson Bary Leriche

## 2020-07-06 NOTE — Progress Notes (Addendum)
[facility] for heparin Indication: chest pain/ACS  Allergies  Allergen Reactions  . Oxycodone Nausea And Vomiting  . Pravastatin Itching    Other reaction(s): Xerostomia, Itching    Patient Measurements: Height: 5\' 10"  (177.8 cm) Weight: 84.4 kg (186 lb 1.1 oz) IBW/kg (Calculated) : 73 Heparin Dosing Weight: 83.9 kg Vital Signs: Temp: 98.1 F (36.7 C) (05/31 0300) Temp Source: Oral (05/31 0300) BP: 97/63 (05/31 0300) Pulse Rate: 59 (05/31 0300)  Labs: Recent Labs    07/03/20 2346 07/04/20 0304 07/04/20 0456 07/04/20 0938 07/04/20 1332 07/04/20 1939 07/05/20 0018 07/05/20 0902 07/06/20 0126  HGB 13.7  --  11.8*  --   --   --  11.2*  --  11.4*  HCT 43.1  --  36.3*  --   --   --  35.2*  --  36.0*  PLT 335  --  278  --   --   --  269  --  310  APTT 29  --   --   --   --   --   --   --   --   LABPROT 13.5  --   --   --   --   --   --   --   --   INR 1.0  --   --   --   --   --   --   --   --   HEPARINUNFRC  --   --   --  0.14*  --    < > 0.53 0.53 0.30  CREATININE 1.00  --  0.93  --   --   --  0.98  --  1.03  TROPONINIHS 367* 1,224*  --  2,594* 2,193*  --   --   --   --    < > = values in this interval not displayed.    Estimated Creatinine Clearance: 66 mL/min (by C-G formula based on SCr of 1.03 mg/dL).   Medical History: Past Medical History:  Diagnosis Date  . COPD (chronic obstructive pulmonary disease) ([place])   . Hypercholesteremia   . Hypertension     Assessment: 75 yo man on heparin for CP/NSTEMI.  He was not on anticoagulation PTA. Plans noted for cath on 5/31  -heparin level 0.3 at goal on heparin drip rate 1600 uts/hr , CBC stable  Goal of Therapy:  Heparin level 0.3-0.7 units/ml Monitor platelets by anticoagulation protocol: Yes   Plan:  Continue heparin at 1600 units/hr Daily HL and CBC while on heparin   Bonnita Nasuti Pharm.D. CPP, BCPS Clinical Pharmacist [phone] 07/06/2020 7:18  AM    **Pharmacist phone directory can now be found on [email or web address]).  Listed under [place].  PM Addendum Post cath plan CABG Resume heparin 8hr post sheath removal  Sheath out at 10am resume at 8pm Heparin drip 1500 uts/hr (slightly below precath rate) increase as needed  Heparin level and CBC in am    Bonnita Nasuti Pharm.D. CPP, BCPS Clinical Pharmacist [phone] 07/06/2020 12:21 PM

## 2020-07-06 NOTE — H&P (View-Only) (Signed)
Progress Note  Patient Name: Blake Sutton Date of Encounter: 07/06/2020  [facility] Cardiologist: Candee Furbish, MD    Subjective   No chest pain  Inpatient Medications    Scheduled Meds: . aspirin EC  81 mg Oral Daily  . atorvastatin  80 mg Oral QHS  . buPROPion  100 mg Oral BID  . carvedilol  12.5 mg Oral BID WC  . pantoprazole  40 mg Oral Daily  . sodium chloride flush  3 mL Intravenous Q12H  . tamsulosin  0.4 mg Oral Daily  . umeclidinium bromide  1 puff Inhalation Daily   Continuous Infusions: . sodium chloride    . sodium chloride 1 mL/kg/hr (07/06/20 0645)  . heparin 1,600 Units/hr (07/06/20 0056)   PRN Meds: sodium chloride, acetaminophen, albuterol, LORazepam, nitroGLYCERIN, sodium chloride flush, zolpidem   Vital Signs    Vitals:   07/05/20 2300 07/06/20 0300 07/06/20 0726 07/06/20 0824  BP: 92/60 97/63 (!) 93/59 96/69  Pulse: 77 (!) 59 63 68  Resp: 18 17 14    Temp: 98 F (36.7 C) 98.1 F (36.7 C)    TempSrc: Oral Oral    SpO2: 95% 93% 96%   Weight:      Height:        Intake/Output Summary (Last 24 hours) at 07/06/2020 7858 Last data filed at 07/06/2020 0547 Gross per 24 hour  Intake --  Output 3200 ml  Net -3200 ml   Last 3 Weights 07/04/2020 07/03/2020  Weight (lbs) 186 lb 1.1 oz 185 lb  Weight (kg) 84.4 kg 83.915 kg      Telemetry    Sinus- Personally Reviewed  ECG    No AM EKG  Physical Exam   General: Well developed, well nourished, NAD  Musculoskeletal: Muscle strength 5/5 all ext  Psychiatric: Mood and affect normal  Neck: No JVD Lungs:Clear bilaterally, no wheezes, rhonci, crackles Cardiovascular: Regular rate and rhythm. No murmurs, gallops or rubs. Abdomen:Soft. Bowel sounds present. Non-tender.  Extremities: No lower extremity edema   Labs    High Sensitivity Troponin:   Recent Labs  Lab 07/03/20 2346 07/04/20 0304 07/04/20 0938 07/04/20 1332  TROPONINIHS 367* 1,224* 2,594* 2,193*       Chemistry Recent Labs  Lab 07/03/20 2346 07/04/20 0456 07/05/20 0018 07/06/20 0126  NA 141 138 139 139  K 3.1* 3.5 4.0 3.5  CL 103 105 101 99  CO2 28 24 31 30   GLUCOSE 114* 171* 119* 118*  BUN 15 14 21  26*  CREATININE 1.00 0.93 0.98 1.03  CALCIUM 8.8* 8.2* 8.5* 8.6*  PROT 7.0  --   --   --   ALBUMIN 3.2*  --   --   --   AST 24  --   --   --   ALT 24  --   --   --   ALKPHOS 198*  --   --   --   BILITOT 0.5  --   --   --   GFRNONAA >60 >60 >60 >60  ANIONGAP 10 9 7 10      Hematology Recent Labs  Lab 07/04/20 0456 07/05/20 0018 07/06/20 0126  WBC 12.0* 15.8* 12.0*  RBC 4.06* 3.90* 4.00*  HGB 11.8* 11.2* 11.4*  HCT 36.3* 35.2* 36.0*  MCV 89.4 90.3 90.0  MCH 29.1 28.7 28.5  MCHC 32.5 31.8 31.7  RDW 14.5 14.7 14.6  PLT 278 269 310    BNP Recent Labs  Lab 07/04/20 0456  BNP 1,155.9*  DDimer  Recent Labs  Lab 07/03/20 2346  DDIMER 2.49*     Radiology    ECHOCARDIOGRAM COMPLETE  Result Date: 07/04/2020    ECHOCARDIOGRAM REPORT   Patient Name:   Blake Sutton Date of Exam: 07/04/2020 Medical Rec #:  [id number]       Height:       70.0 in Accession #:    [id number]      Weight:       186.1 lb Date of Birth:  [birth year]       BSA:          2.024 m Patient Age:    75 years        BP:           133/78 mmHg Patient Gender: M               HR:           90 bpm. Exam Location:  Inpatient Procedure: 2D Echo, Cardiac Doppler and Color Doppler Indications:    NSTEMI  History:        Patient has no prior history of Echocardiogram examinations.                 COPD, Signs/Symptoms:Shortness of Breath and Chest Pain; Risk                 Factors:Hypertension, Dyslipidemia and Current Smoker.                 Pneumonia.  Sonographer:    Dustin Flock Referring Phys: [id number] [place]  1. Hypokinesis of the basal inferolateral wall with overall preserved LV function.  2. Left ventricular ejection fraction, by estimation, is 55 to 60%. The left  ventricle has normal function. The left ventricle demonstrates regional wall motion abnormalities (see scoring diagram/findings for description). There is mild left ventricular  hypertrophy. Left ventricular diastolic parameters are indeterminate. Elevated left atrial pressure.  3. Right ventricular systolic function is normal. The right ventricular size is normal. There is mildly elevated pulmonary artery systolic pressure.  4. The mitral valve is normal in structure. Moderate mitral valve regurgitation. No evidence of mitral stenosis.  5. The aortic valve is tricuspid. Aortic valve regurgitation is not visualized. Mild aortic valve sclerosis is present, with no evidence of aortic valve stenosis.  6. The inferior vena cava is normal in size with greater than 50% respiratory variability, suggesting right atrial pressure of 3 mmHg. FINDINGS  Left Ventricle: Left ventricular ejection fraction, by estimation, is 55 to 60%. The left ventricle has normal function. The left ventricle demonstrates regional wall motion abnormalities. The left ventricular internal cavity size was normal in size. There is mild left ventricular hypertrophy. Left ventricular diastolic parameters are indeterminate. Elevated left atrial pressure. Right Ventricle: The right ventricular size is normal. No increase in right ventricular wall thickness. Right ventricular systolic function is normal. There is mildly elevated pulmonary artery systolic pressure. The tricuspid regurgitant velocity is 2.97  m/s, and with an assumed right atrial pressure of 3 mmHg, the estimated right ventricular systolic pressure is 45.3 mmHg. Left Atrium: Left atrial size was normal in size. Right Atrium: Right atrial size was normal in size. Pericardium: There is no evidence of pericardial effusion. Mitral Valve: The mitral valve is normal in structure. Moderate mitral valve regurgitation. No evidence of mitral valve stenosis. Tricuspid Valve: The tricuspid valve is  normal in structure. Tricuspid valve regurgitation is trivial. No evidence of tricuspid stenosis. Aortic  Valve: The aortic valve is tricuspid. Aortic valve regurgitation is not visualized. Mild aortic valve sclerosis is present, with no evidence of aortic valve stenosis. Pulmonic Valve: The pulmonic valve was normal in structure. Pulmonic valve regurgitation is not visualized. No evidence of pulmonic stenosis. Aorta: The aortic root is normal in size and structure. Venous: The inferior vena cava is normal in size with greater than 50% respiratory variability, suggesting right atrial pressure of 3 mmHg. IAS/Shunts: No atrial level shunt detected by color flow Doppler. Additional Comments: Hypokinesis of the basal inferolateral wall with overall preserved LV function.  LEFT VENTRICLE PLAX 2D LVIDd:         5.30 cm  Diastology LVIDs:         3.80 cm  LV e' medial:    5.55 cm/s LV PW:         1.10 cm  LV E/e' medial:  19.6 LV IVS:        1.20 cm  LV e' lateral:   7.51 cm/s LVOT diam:     2.10 cm  LV E/e' lateral: 14.5 LV SV:         85 LV SV Index:   42 LVOT Area:     3.46 cm  RIGHT VENTRICLE RV Basal diam:  3.40 cm RV S prime:     19.90 cm/s TAPSE (M-mode): 3.0 cm LEFT ATRIUM             Index       RIGHT ATRIUM           Index LA diam:        4.30 cm 2.12 cm/m  RA Area:     24.10 cm LA Vol (A2C):   74.1 ml 36.61 ml/m RA Volume:   78.80 ml  38.93 ml/m LA Vol (A4C):   61.4 ml 30.33 ml/m LA Biplane Vol: 67.8 ml 33.49 ml/m  AORTIC VALVE LVOT Vmax:   112.00 cm/s LVOT Vmean:  77.700 cm/s LVOT VTI:    0.245 m  AORTA Ao Root diam: 3.40 cm MITRAL VALVE                TRICUSPID VALVE MV Area (PHT): 5.13 cm     TR Peak grad:   35.3 mmHg MV Decel Time: 148 msec     TR Vmax:        297.00 cm/s MV E velocity: 109.00 cm/s MV A velocity: 67.70 cm/s   SHUNTS MV E/A ratio:  1.61         Systemic VTI:  0.24 m                             Systemic Diam: 2.10 cm Kirk Ruths MD Electronically signed by Kirk Ruths MD Signature  Date/Time: 07/04/2020/12:41:24 PM    Final     Cardiac Studies    Echo 07/04/2020: Normal LVEF of 55 to 60% with basal inferolateral HK.  Mild LVH.  Indeterminate diastolic pressures with elevated LAP.  Mildly elevated PAP with normal RAP.Marland Kitchen  Moderate MR.  Mild aortic sclerosis with no stenosis..  Patient Profile     75 y.o. male with history of hypertension, hyperlipidemia and COPD presenting with chest pain and found to have a NSTEMI.   Assessment & Plan    1. CAD/NSTEMI: No chest pain this am. He remains on IV heparin. Peak troponin 2594. Echo with basal inferolateral hypokinesis. Continue ASA, statin and beta blocker. Cardiac cath  this am.   2. Hyperlipidemia: Continue statin  3. HTN: BP soft over the weekend but stable.   4. Elevated d-dimer: Negative PE protocol CT   5. Ascending aortic aneurysm: Ascending aorta measured at 4.4 cm on PE protocol CT will need annual follow-up.   6. Tobacco abuse: Cessation recommended.    For questions or updates, please contact [place] Please consult [email or web address] for contact info under        Signed, Lauree Chandler, MD  07/06/2020, 9:07 AM

## 2020-07-06 NOTE — Progress Notes (Signed)
[id number] Gave pt OHS booklet, care guide,staying in the tube handout and wrote down how to view pre op video. Pt stated not interested at this time in video. Gave IS and pt demonstrated 2000-2250 ml correctly. Pt stated family will be available to assist in care after discharge. Discussed sternal precautions and staying in tube. Discussed importance of walking and IS after surgery. Pt voiced understanding.  Graylon Good RN BSN 07/06/2020 2:25 PM

## 2020-07-06 NOTE — Progress Notes (Signed)
TCTS consulted for CABG evaluation. °

## 2020-07-06 NOTE — Progress Notes (Signed)
Progress Note  Patient Name: Blake Sutton Date of Encounter: 07/06/2020  [facility] Cardiologist: Candee Furbish, MD    Subjective   No chest pain  Inpatient Medications    Scheduled Meds: . aspirin EC  81 mg Oral Daily  . atorvastatin  80 mg Oral QHS  . buPROPion  100 mg Oral BID  . carvedilol  12.5 mg Oral BID WC  . pantoprazole  40 mg Oral Daily  . sodium chloride flush  3 mL Intravenous Q12H  . tamsulosin  0.4 mg Oral Daily  . umeclidinium bromide  1 puff Inhalation Daily   Continuous Infusions: . sodium chloride    . sodium chloride 1 mL/kg/hr (07/06/20 0645)  . heparin 1,600 Units/hr (07/06/20 0056)   PRN Meds: sodium chloride, acetaminophen, albuterol, LORazepam, nitroGLYCERIN, sodium chloride flush, zolpidem   Vital Signs    Vitals:   07/05/20 2300 07/06/20 0300 07/06/20 0726 07/06/20 0824  BP: 92/60 97/63 (!) 93/59 96/69  Pulse: 77 (!) 59 63 68  Resp: 18 17 14    Temp: 98 F (36.7 C) 98.1 F (36.7 C)    TempSrc: Oral Oral    SpO2: 95% 93% 96%   Weight:      Height:        Intake/Output Summary (Last 24 hours) at 07/06/2020 7062 Last data filed at 07/06/2020 0547 Gross per 24 hour  Intake --  Output 3200 ml  Net -3200 ml   Last 3 Weights 07/04/2020 07/03/2020  Weight (lbs) 186 lb 1.1 oz 185 lb  Weight (kg) 84.4 kg 83.915 kg      Telemetry    Sinus- Personally Reviewed  ECG    No AM EKG  Physical Exam   General: Well developed, well nourished, NAD  Musculoskeletal: Muscle strength 5/5 all ext  Psychiatric: Mood and affect normal  Neck: No JVD Lungs:Clear bilaterally, no wheezes, rhonci, crackles Cardiovascular: Regular rate and rhythm. No murmurs, gallops or rubs. Abdomen:Soft. Bowel sounds present. Non-tender.  Extremities: No lower extremity edema   Labs    High Sensitivity Troponin:   Recent Labs  Lab 07/03/20 2346 07/04/20 0304 07/04/20 0938 07/04/20 1332  TROPONINIHS 367* 1,224* 2,594* 2,193*       Chemistry Recent Labs  Lab 07/03/20 2346 07/04/20 0456 07/05/20 0018 07/06/20 0126  NA 141 138 139 139  K 3.1* 3.5 4.0 3.5  CL 103 105 101 99  CO2 28 24 31 30   GLUCOSE 114* 171* 119* 118*  BUN 15 14 21  26*  CREATININE 1.00 0.93 0.98 1.03  CALCIUM 8.8* 8.2* 8.5* 8.6*  PROT 7.0  --   --   --   ALBUMIN 3.2*  --   --   --   AST 24  --   --   --   ALT 24  --   --   --   ALKPHOS 198*  --   --   --   BILITOT 0.5  --   --   --   GFRNONAA >60 >60 >60 >60  ANIONGAP 10 9 7 10      Hematology Recent Labs  Lab 07/04/20 0456 07/05/20 0018 07/06/20 0126  WBC 12.0* 15.8* 12.0*  RBC 4.06* 3.90* 4.00*  HGB 11.8* 11.2* 11.4*  HCT 36.3* 35.2* 36.0*  MCV 89.4 90.3 90.0  MCH 29.1 28.7 28.5  MCHC 32.5 31.8 31.7  RDW 14.5 14.7 14.6  PLT 278 269 310    BNP Recent Labs  Lab 07/04/20 0456  BNP 1,155.9*  DDimer  Recent Labs  Lab 07/03/20 2346  DDIMER 2.49*     Radiology    ECHOCARDIOGRAM COMPLETE  Result Date: 07/04/2020    ECHOCARDIOGRAM REPORT   Patient Name:   Blake Sutton Date of Exam: 07/04/2020 Medical Rec #:  [id number]       Height:       70.0 in Accession #:    [id number]      Weight:       186.1 lb Date of Birth:  05/22/1945       BSA:          2.024 m Patient Age:    15 years        BP:           133/78 mmHg Patient Gender: M               HR:           90 bpm. Exam Location:  Inpatient Procedure: 2D Echo, Cardiac Doppler and Color Doppler Indications:    NSTEMI  History:        Patient has no prior history of Echocardiogram examinations.                 COPD, Signs/Symptoms:Shortness of Breath and Chest Pain; Risk                 Factors:Hypertension, Dyslipidemia and Current Smoker.                 Pneumonia.  Sonographer:    Dustin Flock Referring Phys: [id number] [place]  1. Hypokinesis of the basal inferolateral wall with overall preserved LV function.  2. Left ventricular ejection fraction, by estimation, is 55 to 60%. The left  ventricle has normal function. The left ventricle demonstrates regional wall motion abnormalities (see scoring diagram/findings for description). There is mild left ventricular  hypertrophy. Left ventricular diastolic parameters are indeterminate. Elevated left atrial pressure.  3. Right ventricular systolic function is normal. The right ventricular size is normal. There is mildly elevated pulmonary artery systolic pressure.  4. The mitral valve is normal in structure. Moderate mitral valve regurgitation. No evidence of mitral stenosis.  5. The aortic valve is tricuspid. Aortic valve regurgitation is not visualized. Mild aortic valve sclerosis is present, with no evidence of aortic valve stenosis.  6. The inferior vena cava is normal in size with greater than 50% respiratory variability, suggesting right atrial pressure of 3 mmHg. FINDINGS  Left Ventricle: Left ventricular ejection fraction, by estimation, is 55 to 60%. The left ventricle has normal function. The left ventricle demonstrates regional wall motion abnormalities. The left ventricular internal cavity size was normal in size. There is mild left ventricular hypertrophy. Left ventricular diastolic parameters are indeterminate. Elevated left atrial pressure. Right Ventricle: The right ventricular size is normal. No increase in right ventricular wall thickness. Right ventricular systolic function is normal. There is mildly elevated pulmonary artery systolic pressure. The tricuspid regurgitant velocity is 2.97  m/s, and with an assumed right atrial pressure of 3 mmHg, the estimated right ventricular systolic pressure is 70.0 mmHg. Left Atrium: Left atrial size was normal in size. Right Atrium: Right atrial size was normal in size. Pericardium: There is no evidence of pericardial effusion. Mitral Valve: The mitral valve is normal in structure. Moderate mitral valve regurgitation. No evidence of mitral valve stenosis. Tricuspid Valve: The tricuspid valve is  normal in structure. Tricuspid valve regurgitation is trivial. No evidence of tricuspid stenosis. Aortic  Valve: The aortic valve is tricuspid. Aortic valve regurgitation is not visualized. Mild aortic valve sclerosis is present, with no evidence of aortic valve stenosis. Pulmonic Valve: The pulmonic valve was normal in structure. Pulmonic valve regurgitation is not visualized. No evidence of pulmonic stenosis. Aorta: The aortic root is normal in size and structure. Venous: The inferior vena cava is normal in size with greater than 50% respiratory variability, suggesting right atrial pressure of 3 mmHg. IAS/Shunts: No atrial level shunt detected by color flow Doppler. Additional Comments: Hypokinesis of the basal inferolateral wall with overall preserved LV function.  LEFT VENTRICLE PLAX 2D LVIDd:         5.30 cm  Diastology LVIDs:         3.80 cm  LV e' medial:    5.55 cm/s LV PW:         1.10 cm  LV E/e' medial:  19.6 LV IVS:        1.20 cm  LV e' lateral:   7.51 cm/s LVOT diam:     2.10 cm  LV E/e' lateral: 14.5 LV SV:         85 LV SV Index:   42 LVOT Area:     3.46 cm  RIGHT VENTRICLE RV Basal diam:  3.40 cm RV S prime:     19.90 cm/s TAPSE (M-mode): 3.0 cm LEFT ATRIUM             Index       RIGHT ATRIUM           Index LA diam:        4.30 cm 2.12 cm/m  RA Area:     24.10 cm LA Vol (A2C):   74.1 ml 36.61 ml/m RA Volume:   78.80 ml  38.93 ml/m LA Vol (A4C):   61.4 ml 30.33 ml/m LA Biplane Vol: 67.8 ml 33.49 ml/m  AORTIC VALVE LVOT Vmax:   112.00 cm/s LVOT Vmean:  77.700 cm/s LVOT VTI:    0.245 m  AORTA Ao Root diam: 3.40 cm MITRAL VALVE                TRICUSPID VALVE MV Area (PHT): 5.13 cm     TR Peak grad:   35.3 mmHg MV Decel Time: 148 msec     TR Vmax:        297.00 cm/s MV E velocity: 109.00 cm/s MV A velocity: 67.70 cm/s   SHUNTS MV E/A ratio:  1.61         Systemic VTI:  0.24 m                             Systemic Diam: 2.10 cm Kirk Ruths MD Electronically signed by Kirk Ruths MD Signature  Date/Time: 07/04/2020/12:41:24 PM    Final     Cardiac Studies    Echo 07/04/2020: Normal LVEF of 55 to 60% with basal inferolateral HK.  Mild LVH.  Indeterminate diastolic pressures with elevated LAP.  Mildly elevated PAP with normal RAP.Marland Kitchen  Moderate MR.  Mild aortic sclerosis with no stenosis..  Patient Profile     75 y.o. male with history of hypertension, hyperlipidemia and COPD presenting with chest pain and found to have a NSTEMI.   Assessment & Plan    1. CAD/NSTEMI: No chest pain this am. He remains on IV heparin. Peak troponin 2594. Echo with basal inferolateral hypokinesis. Continue ASA, statin and beta blocker. Cardiac cath  this am.   2. Hyperlipidemia: Continue statin  3. HTN: BP soft over the weekend but stable.   4. Elevated d-dimer: Negative PE protocol CT   5. Ascending aortic aneurysm: Ascending aorta measured at 4.4 cm on PE protocol CT will need annual follow-up.   6. Tobacco abuse: Cessation recommended.    For questions or updates, please contact [place] Please consult [email or web address] for contact info under        Signed, Lauree Chandler, MD  07/06/2020, 9:07 AM

## 2020-07-06 NOTE — Interval H&P Note (Signed)
Cath Lab Visit (complete for each Cath Lab visit)  Clinical Evaluation Leading to the Procedure:   ACS: Yes.    Non-ACS:    Anginal Classification: CCS IV  Anti-ischemic medical therapy: Minimal Therapy (1 class of medications)  Non-Invasive Test Results: No non-invasive testing performed  Prior CABG: No previous CABG      History and Physical Interval Note:  07/06/2020 9:28 AM  Blake Sutton  has presented today for surgery, with the diagnosis of NSTEMI.  The various methods of treatment have been discussed with the patient and family. After consideration of risks, benefits and other options for treatment, the patient has consented to  Procedure(s): LEFT HEART CATH AND CORONARY ANGIOGRAPHY (N/A) as a surgical intervention.  The patient's history has been reviewed, patient examined, no change in status, stable for surgery.  I have reviewed the patient's chart and labs.  Questions were answered to the patient's satisfaction.     Larae Grooms

## 2020-07-06 NOTE — Progress Notes (Signed)
PROGRESS NOTE    OAKLAND FANT  BMW:[id number] DOB: [birth year] DOA: 07/03/2020 PCP: No primary care provider on file.  2C04C/2C04C-01   Assessment & Plan:   Principal Problem:   NSTEMI (non-ST elevated myocardial infarction) ([place]) Active Problems:   Essential hypertension   COPD (chronic obstructive pulmonary disease) (HCC)   Hyperlipidemia LDL goal <70   Elevated d-dimer   Leukocytosis   CAP (community acquired pneumonia)   Coronary artery calcification seen on computed tomography   Acute respiratory failure with hypoxia (HCC)   Acute diastolic heart failure ([place])   Blake Sutton is a 75 y.o. male with medical history significant for HTN, HLD, COPD, presents by EMS with complaint of Chest pain and SOB. Was at home when developed central chest pain and pressure. States it felt like a rock on his sternum and was associated with SOB and nausea. He did not have a cough or fever. He states he has not had swelling of his legs. He has no history of blood clots. States he has not had prolonged immobilization. Denies history of CHF. Denies hx of CAD or cardiac intervention in past.  Continues to smoke occasionally.    NSTEMI (non-ST elevated myocardial infarction) CAD with significant multi-vessel disease --Initial troponin is elevated at 367, peaked at 2594. --Started on heparin infusion in the ER.  --cardiology consulted --Echo did show LV regional wall motion abnormalities  Plan: --Left heart cath today, found significant multi-vessel disease.   --plan for CABG --cont heparin gtt --cont coreg (new) --cont ASA 81 and Lipitor - defer P2Y12 until after cath  Acute hypoxic respiratory failure 2/2 Fluid overload 2/2 acute diastolic heart failure from ACS --CT chest showed fluid overload --BNP 1155 --s/p IV lasix 40 mg BID Plan: --Hold Lasix today    Essential hypertension --BP intermittently soft Plan: --hold diuresis today --cont coreg --hold home Norvasc and HCTZ  for now    COPD  --stable, no wheezing --cont daily inhaler    Hyperlipidemia - cont Lipitor 80     CAP, ruled out --lung imaging finding more consistent with fluid overload than infection --procal neg --abx d/c'ed    Elevated d-dimer --CTA chest neg for PE    Hypokalemia --monitor and replete PRN  Ascending aortic aneurysm --Ascending aorta measured at 4.4 cm on PE protocol CT --will need annual follow-up.  Current smoker --cessation advised   DVT prophylaxis: [id number] gtt Code Status: Full code  Family Communication:  Level of care: Telemetry Cardiac Dispo:   The patient is from: home Anticipated d/c is to: home Anticipated d/c date is: undetermined  Patient currently is not medically ready to d/c due to: pending CABG   Subjective and Interval History:  Left heart cath found significant multi-vessel disease.  Plan for CABG.  Post-procedure, pt reported some sharp chest pain intermittently.  Also felt hungry.     Objective: Vitals:   07/06/20 1100 07/06/20 1200 07/06/20 1610 07/06/20 1757  BP: 97/63 116/77 108/69 105/71  Pulse: (!) 59 (!) 58  68  Resp: 16 17    Temp: 98.2 F (36.8 C) 98.2 F (36.8 C) 98.4 F (36.9 C)   TempSrc: Oral Oral Oral   SpO2: 97% 98%    Weight:      Height:        Intake/Output Summary (Last 24 hours) at 07/06/2020 1909 Last data filed at 07/06/2020 1801 Gross per 24 hour  Intake 605.86 ml  Output 1500 ml  Net -894.14 ml   Danley Danker  Weights   07/03/20 2301 07/04/20 0400  Weight: 83.9 kg 84.4 kg    Examination:   Constitutional: NAD, AAOx3 HEENT: conjunctivae and lids normal, EOMI CV: No cyanosis.   RESP: normal respiratory effort, on 3L Extremities: No effusions, edema in BLE SKIN: warm, dry Neuro: II - XII grossly intact.   Psych: depressed mood and affect.  Appropriate judgement and reason   Data Reviewed: I have personally reviewed following labs and imaging studies  CBC: Recent Labs  Lab  07/03/20 2346 07/04/20 0456 07/05/20 0018 07/06/20 0126  WBC 14.8* 12.0* 15.8* 12.0*  NEUTROABS 10.5*  --   --   --   HGB 13.7 11.8* 11.2* 11.4*  HCT 43.1 36.3* 35.2* 36.0*  MCV 91.9 89.4 90.3 90.0  PLT 335 278 269 789   Basic Metabolic Panel: Recent Labs  Lab 07/03/20 2346 07/04/20 0456 07/05/20 0018 07/06/20 0126  NA 141 138 139 139  K 3.1* 3.5 4.0 3.5  CL 103 105 101 99  CO2 28 24 31 30   GLUCOSE 114* 171* 119* 118*  BUN 15 14 21  26*  CREATININE 1.00 0.93 0.98 1.03  CALCIUM 8.8* 8.2* 8.5* 8.6*  MG  --   --  2.1 2.1   GFR: Estimated Creatinine Clearance: 66 mL/min (by C-G formula based on SCr of 1.03 mg/dL). Liver Function Tests: Recent Labs  Lab 07/03/20 2346  AST 24  ALT 24  ALKPHOS 198*  BILITOT 0.5  PROT 7.0  ALBUMIN 3.2*   No results for input(s): LIPASE, AMYLASE in the last 168 hours. No results for input(s): AMMONIA in the last 168 hours. Coagulation Profile: Recent Labs  Lab 07/03/20 2346  INR 1.0   Cardiac Enzymes: No results for input(s): CKTOTAL, CKMB, CKMBINDEX, TROPONINI in the last 168 hours. BNP (last 3 results) No results for input(s): PROBNP in the last 8760 hours. HbA1C: Recent Labs    07/05/20 0018  HGBA1C 5.9*   CBG: No results for input(s): GLUCAP in the last 168 hours. Lipid Profile: Recent Labs    07/04/20 0456  CHOL 170  HDL 51  LDLCALC 112*  TRIG 35  CHOLHDL 3.3   Thyroid Function Tests: No results for input(s): TSH, T4TOTAL, FREET4, T3FREE, THYROIDAB in the last 72 hours. Anemia Panel: No results for input(s): VITAMINB12, FOLATE, FERRITIN, TIBC, IRON, RETICCTPCT in the last 72 hours. Sepsis Labs: Recent Labs  Lab 07/03/20 2328 07/04/20 0128 07/04/20 0938  PROCALCITON  --   --  <0.10  LATICACIDVEN 1.9 1.3  --     Recent Results (from the past 240 hour(s))  Blood culture (routine single)     Status: None (Preliminary result)   Collection Time: 07/03/20 11:30 PM   Specimen: BLOOD  Result Value Ref Range  Status   Specimen Description BLOOD SITE NOT SPECIFIED  Final   Special Requests   Final    BOTTLES DRAWN AEROBIC AND ANAEROBIC Blood Culture adequate volume   Culture   Final    NO GROWTH 3 DAYS Performed at [facility], [place] [address] [place], [place] [address]    Report Status PENDING  Incomplete  Culture, blood (single)     Status: None (Preliminary result)   Collection Time: 07/03/20 11:30 PM   Specimen: BLOOD  Result Value Ref Range Status   Specimen Description BLOOD SITE NOT SPECIFIED  Final   Special Requests   Final    BOTTLES DRAWN AEROBIC AND ANAEROBIC Blood Culture results may not be optimal due to an  inadequate volume of blood received in culture bottles   Culture   Final    NO GROWTH 2 DAYS Performed at [facility], [place] [address] [place], [place] [address]    Report Status PENDING  Incomplete  SARS CORONAVIRUS 2 (TAT 6-24 HRS) Nasopharyngeal Nasopharyngeal Swab     Status: None   Collection Time: 07/03/20 11:31 PM   Specimen: Nasopharyngeal Swab  Result Value Ref Range Status   SARS Coronavirus 2 NEGATIVE NEGATIVE Final    Comment: (NOTE) SARS-CoV-2 target nucleic acids are NOT DETECTED.  The SARS-CoV-2 RNA is generally detectable in upper and lower respiratory specimens during the acute phase of infection. Negative results do not preclude SARS-CoV-2 infection, do not rule out co-infections with other pathogens, and should not be used as the sole basis for treatment or other patient management decisions. Negative results must be combined with clinical observations, patient history, and epidemiological information. The expected result is Negative.  Fact Sheet for Patients: SugarRoll.be  Fact Sheet for Healthcare Providers: https://www.woods-mathews.com/  This test is not yet approved or cleared by the [place] FDA and  has been authorized for detection and/or diagnosis of SARS-CoV-2 by FDA under  an Emergency Use Authorization (EUA). This EUA will remain  in effect (meaning this test can be used) for the duration of the COVID-19 declaration under Se ction 564(b)(1) of the Act, 21 U.S.C. section 360bbb-3(b)(1), unless the authorization is terminated or revoked sooner.  Performed at [facility], [place] [address], [place], [place] [address]   Urine culture     Status: Abnormal   Collection Time: 07/04/20  2:22 AM   Specimen: In/Out Cath Urine  Result Value Ref Range Status   Specimen Description IN/OUT CATH URINE  Final   Special Requests   Final    NONE Performed at [facility], [place] [address], [place], [place] [address]    Culture >=100,000 COLONIES/mL KLEBSIELLA PNEUMONIAE (A)  Final   Report Status 07/06/2020 FINAL  Final   Organism ID, Bacteria KLEBSIELLA PNEUMONIAE (A)  Final      Susceptibility   Klebsiella pneumoniae - MIC*    AMPICILLIN >=32 RESISTANT Resistant     CEFAZOLIN <=4 SENSITIVE Sensitive     CEFEPIME <=0.12 SENSITIVE Sensitive     CEFTRIAXONE <=0.25 SENSITIVE Sensitive     CIPROFLOXACIN <=0.25 SENSITIVE Sensitive     GENTAMICIN <=1 SENSITIVE Sensitive     IMIPENEM <=0.25 SENSITIVE Sensitive     NITROFURANTOIN 64 INTERMEDIATE Intermediate     TRIMETH/SULFA <=20 SENSITIVE Sensitive     AMPICILLIN/SULBACTAM 4 SENSITIVE Sensitive     PIP/TAZO <=4 SENSITIVE Sensitive     * >=100,000 COLONIES/mL KLEBSIELLA PNEUMONIAE  MRSA PCR Screening     Status: None   Collection Time: 07/04/20  4:46 AM   Specimen: Nasal Mucosa; Nasopharyngeal  Result Value Ref Range Status   MRSA by PCR NEGATIVE NEGATIVE Final    Comment:        The GeneXpert MRSA Assay (FDA approved for NASAL specimens only), is one component of a comprehensive MRSA colonization surveillance program. It is not intended to diagnose MRSA infection nor to guide or monitor treatment for MRSA infections. Performed at [facility], [place] [address] [place], [place] [address]        Radiology Studies: CARDIAC CATHETERIZATION  Result Date: 07/06/2020  1st Mrg lesion is 75% stenosed.  Mid Cx lesion is 99% stenosed.  Ost LAD to Prox LAD lesion is  60% stenosed.  Mid LAD lesion is 90% stenosed. Ectatic area in the mid LAD at the end of the stenosis.  Mid LAD-2 lesion is 50% stenosed.  LV end diastolic pressure is normal.  There is no aortic valve stenosis.  Left dominant circulation.   Calcific OM1 and LAD disease.  Moderate ostial LAD disease.  Culprit was lesion in the circumflex that was heavily thrombotic with high risk of distal embolization if PCI attempted.  Continue anticoagulation.  Cardiac surgery consult.  Consider GP IIb/IIIa inhibitor depending on timing of the surgery.     Scheduled Meds: . aspirin EC  81 mg Oral Daily  . atorvastatin  80 mg Oral QHS  . buPROPion  100 mg Oral BID  . carvedilol  12.5 mg Oral BID WC  . FLUoxetine  20 mg Oral Daily  . pantoprazole  40 mg Oral Daily  . sodium chloride flush  3 mL Intravenous Q12H  . sodium chloride flush  3 mL Intravenous Q12H  . tamsulosin  0.4 mg Oral Daily  . traZODone  50 mg Oral QHS  . umeclidinium bromide  1 puff Inhalation Daily   Continuous Infusions: . sodium chloride    . heparin 1,500 Units/hr (07/06/20 1801)     LOS: 2 days     Enzo Bi, MD [facility] If 7PM-7AM, please contact night-coverage 07/06/2020, 7:09 PM

## 2020-07-06 NOTE — Plan of Care (Signed)

## 2020-07-07 ENCOUNTER — Inpatient Hospital Stay (HOSPITAL_COMMUNITY): Payer: Medicare Other

## 2020-07-07 DIAGNOSIS — I214 Non-ST elevation (NSTEMI) myocardial infarction: Secondary | ICD-10-CM | POA: Diagnosis not present

## 2020-07-07 DIAGNOSIS — I10 Essential (primary) hypertension: Secondary | ICD-10-CM | POA: Diagnosis not present

## 2020-07-07 DIAGNOSIS — I455 Other specified heart block: Secondary | ICD-10-CM | POA: Diagnosis not present

## 2020-07-07 DIAGNOSIS — Z01810 Encounter for preprocedural cardiovascular examination: Secondary | ICD-10-CM | POA: Diagnosis not present

## 2020-07-07 LAB — BASIC METABOLIC PANEL
Anion gap: 7 (ref 5–15)
BUN: 20 mg/dL (ref 8–23)
CO2: 28 mmol/L (ref 22–32)
Calcium: 8.4 mg/dL — ABNORMAL LOW (ref 8.9–10.3)
Chloride: 102 mmol/L (ref 98–111)
Creatinine, Ser: 1.02 mg/dL (ref 0.61–1.24)
GFR, Estimated: >60 mL/min (ref >60)
Glucose, Bld: 114 mg/dL — ABNORMAL HIGH (ref 70–99)
Potassium: 4.1 mmol/L (ref 3.5–5.1)
Sodium: 137 mmol/L (ref 135–145)

## 2020-07-07 LAB — PULMONARY FUNCTION TEST
FEF 25-75 Pre: 1.09 L/sec
FEF2575-%Pred-Pre: 46 %
FEV1-%Pred-Pre: 63 %
FEV1-Pre: 1.99 L
FEV1FVC-%Pred-Pre: 95 %
FEV6-%Pred-Pre: 67 %
FEV6-Pre: 2.75 L
FEV6FVC-%Pred-Pre: 102 %
FVC-%Pred-Pre: 66 %
FVC-Pre: 2.86 L
Pre FEV1/FVC ratio: 70 %
Pre FEV6/FVC Ratio: 97 %

## 2020-07-07 LAB — CBC
HCT: 33 % — ABNORMAL LOW (ref 39.0–52.0)
Hemoglobin: 10.6 g/dL — ABNORMAL LOW (ref 13.0–17.0)
MCH: 29 pg (ref 26.0–34.0)
MCHC: 32.1 g/dL (ref 30.0–36.0)
MCV: 90.4 fL (ref 80.0–100.0)
Platelets: 248 10*3/uL (ref 150–400)
RBC: 3.65 MIL/uL — ABNORMAL LOW (ref 4.22–5.81)
RDW: 14.4 % (ref 11.5–15.5)
WBC: 8.1 10*3/uL (ref 4.0–10.5)
nRBC: 0 % (ref 0.0–0.2)

## 2020-07-07 LAB — MAGNESIUM: Magnesium: 2.1 mg/dL (ref 1.7–2.4)

## 2020-07-07 LAB — HEPARIN LEVEL (UNFRACTIONATED)
Heparin Unfractionated: 0.23 IU/mL — ABNORMAL LOW (ref 0.30–0.70)
Heparin Unfractionated: 0.34 IU/mL (ref 0.30–0.70)

## 2020-07-07 LAB — PREPARE RBC (CROSSMATCH)

## 2020-07-07 LAB — ABO/RH: ABO/RH(D): O NEG

## 2020-07-07 MED ORDER — TRANEXAMIC ACID (OHS) BOLUS VIA INFUSION
15.0000 mg/kg | INTRAVENOUS | Status: AC
  Administered 2020-07-08: 1266 mg via INTRAVENOUS
  Filled 2020-07-07: qty 1266

## 2020-07-07 MED ORDER — SODIUM CHLORIDE 0.9 % IV SOLN
INTRAVENOUS | Status: DC
  Filled 2020-07-07: qty 30

## 2020-07-07 MED ORDER — MANNITOL 20 % IV SOLN
Freq: Once | INTRAVENOUS | Status: DC
  Filled 2020-07-07: qty 13

## 2020-07-07 MED ORDER — CEFAZOLIN SODIUM-DEXTROSE 2-4 GM/100ML-% IV SOLN
2.0000 g | INTRAVENOUS | Status: AC
  Administered 2020-07-08: 2 g via INTRAVENOUS
  Filled 2020-07-07: qty 100

## 2020-07-07 MED ORDER — TEMAZEPAM 15 MG PO CAPS: 15.0000 mg | ORAL_CAPSULE | Freq: Once | ORAL | Status: DC | PRN

## 2020-07-07 MED ORDER — CHLORHEXIDINE GLUCONATE CLOTH 2 % EX PADS
6.0000 | MEDICATED_PAD | Freq: Once | CUTANEOUS | Status: AC
Start: 2020-07-07 — End: 2020-07-07
  Administered 2020-07-07: 6 via TOPICAL

## 2020-07-07 MED ORDER — BISACODYL 5 MG PO TBEC
5.0000 mg | DELAYED_RELEASE_TABLET | Freq: Once | ORAL | Status: AC
  Administered 2020-07-07: 5 mg via ORAL
  Filled 2020-07-07: qty 1

## 2020-07-07 MED ORDER — NITROGLYCERIN IN D5W 200-5 MCG/ML-% IV SOLN
2.0000 ug/min | INTRAVENOUS | Status: DC
  Filled 2020-07-07: qty 250

## 2020-07-07 MED ORDER — POTASSIUM CHLORIDE 2 MEQ/ML IV SOLN
80.0000 meq | INTRAVENOUS | Status: DC
  Filled 2020-07-07: qty 40

## 2020-07-07 MED ORDER — TRANEXAMIC ACID (OHS) PUMP PRIME SOLUTION
2.0000 mg/kg | INTRAVENOUS | Status: DC
  Filled 2020-07-07: qty 1.69

## 2020-07-07 MED ORDER — CHLORHEXIDINE GLUCONATE 0.12 % MT SOLN
15.0000 mL | Freq: Once | OROMUCOSAL | Status: AC
Start: 2020-07-08 — End: 2020-07-08
  Administered 2020-07-08: 15 mL via OROMUCOSAL
  Filled 2020-07-07: qty 15

## 2020-07-07 MED ORDER — PLASMA-LYTE A IV SOLN
INTRAVENOUS | Status: DC
  Filled 2020-07-07: qty 5

## 2020-07-07 MED ORDER — MILRINONE LACTATE IN DEXTROSE 20-5 MG/100ML-% IV SOLN
0.3000 ug/kg/min | INTRAVENOUS | Status: DC
  Filled 2020-07-07: qty 100

## 2020-07-07 MED ORDER — NOREPINEPHRINE 4 MG/250ML-% IV SOLN
0.0000 ug/min | INTRAVENOUS | Status: AC
  Administered 2020-07-08: 6 ug/min via INTRAVENOUS
  Administered 2020-07-08: 2 ug/min via INTRAVENOUS
  Filled 2020-07-07: qty 250

## 2020-07-07 MED ORDER — EPINEPHRINE HCL 5 MG/250ML IV SOLN IN NS
0.0000 ug/min | INTRAVENOUS | Status: DC
  Filled 2020-07-07: qty 250

## 2020-07-07 MED ORDER — INSULIN REGULAR(HUMAN) IN NACL 100-0.9 UT/100ML-% IV SOLN
INTRAVENOUS | Status: AC
  Administered 2020-07-08: .9 [IU]/h via INTRAVENOUS
  Filled 2020-07-07: qty 100

## 2020-07-07 MED ORDER — DEXMEDETOMIDINE HCL IN NACL 400 MCG/100ML IV SOLN
0.1000 ug/kg/h | INTRAVENOUS | Status: AC
  Administered 2020-07-08: .5 ug/kg/h via INTRAVENOUS
  Filled 2020-07-07: qty 100

## 2020-07-07 MED ORDER — TRANEXAMIC ACID 1000 MG/10ML IV SOLN
1.5000 mg/kg/h | INTRAVENOUS | Status: AC
  Administered 2020-07-08: 1.5 mg/kg/h via INTRAVENOUS
  Filled 2020-07-07: qty 25

## 2020-07-07 MED ORDER — CHLORHEXIDINE GLUCONATE CLOTH 2 % EX PADS
6.0000 | MEDICATED_PAD | Freq: Once | CUTANEOUS | Status: AC
  Administered 2020-07-08: 6 via TOPICAL

## 2020-07-07 MED ORDER — VANCOMYCIN HCL 1500 MG/300ML IV SOLN
1500.0000 mg | INTRAVENOUS | Status: AC
  Administered 2020-07-08: 1500 mg via INTRAVENOUS
  Filled 2020-07-07: qty 300

## 2020-07-07 MED ORDER — METOPROLOL TARTRATE 12.5 MG HALF TABLET
12.5000 mg | ORAL_TABLET | Freq: Once | ORAL | Status: AC
  Administered 2020-07-08: 12.5 mg via ORAL
  Filled 2020-07-07: qty 1

## 2020-07-07 MED ORDER — PHENYLEPHRINE HCL-NACL 20-0.9 MG/250ML-% IV SOLN
30.0000 ug/min | INTRAVENOUS | Status: AC
  Administered 2020-07-08: 30 ug/min via INTRAVENOUS
  Filled 2020-07-07: qty 250

## 2020-07-07 MED FILL — Nitroglycerin IV Soln 100 MCG/ML in D5W: INTRA_ARTERIAL | Qty: 10 | Status: AC

## 2020-07-07 NOTE — Progress Notes (Signed)
     [address]       [place] [address]             [phone]       No events  Vitals:   07/07/20 0700 07/07/20 1154  BP: (!) 95/57 99/66  Pulse: 69 75  Resp: 18 20  Temp: 98.4 F (36.9 C) (!) 97.5 F (36.4 C)  SpO2: 98% 95%   Alert NAD Sinus EWOB  OR tomorrow for CABG 3.  Ranson Belluomini Bary Leriche

## 2020-07-07 NOTE — Care Management Important Message (Signed)
Important Message  Patient Details  Name: Blake Sutton MRN: [id number] Date of Birth: [birth year]   Medicare Important Message Given:  Yes     Dushaun Okey [address] 07/07/2020, 2:59 PM

## 2020-07-07 NOTE — Progress Notes (Signed)
Mobility Specialist: Progress Note   07/07/20 1245  Mobility  Activity Ambulated in hall  Level of Assistance Independent  Assistive Device None  Distance Ambulated (ft) 800 ft  Mobility Ambulated independently in hallway  Mobility Response Tolerated well  Mobility performed by Mobility specialist  $Mobility charge 1 Mobility   Pre-Mobility: 72 HR, 89/55 (67) BP, 95% SpO2 During Mobility: 92 HR Post-Mobility: 69 HR, 91/68  (73) BP, 99% SpO2  Pt ambulated on 3 L/min [place]. Pt asx throughout with fast gait. Pt back to bed after walk with call bell at his side.   [facility] Blake Sutton Mobility Specialist Mobility Specialist Phone: [phone]

## 2020-07-07 NOTE — Plan of Care (Signed)
  Problem: Activity: Goal: Ability to tolerate increased activity will improve Outcome: Progressing   Problem: Cardiac: Goal: Vascular access site(s) Level 0-1 will be maintained Outcome: Progressing   Problem: Education: Goal: Knowledge of General Education information will improve Description: Including pain rating scale, medication(s)/side effects and non-pharmacologic comfort measures Outcome: Progressing   Problem: Nutrition: Goal: Adequate nutrition will be maintained Outcome: Progressing   Problem: Elimination: Goal: Will not experience complications related to urinary retention Outcome: Progressing   Problem: Pain Managment: Goal: General experience of comfort will improve Outcome: Progressing

## 2020-07-07 NOTE — Progress Notes (Signed)
ANTICOAGULATION CONSULT NOTE - Follow Up Consult  Pharmacy Consult for heparin Indication: CAD awaiting possible CABG  Labs: Recent Labs    07/04/20 0938 07/04/20 1332 07/04/20 1939 07/05/20 0018 07/05/20 0902 07/06/20 0126 07/07/20 0256  HGB  --   --   --  11.2*  --  11.4* 10.6*  HCT  --   --   --  35.2*  --  36.0* 33.0*  PLT  --   --   --  269  --  310 248  HEPARINUNFRC 0.14*  --    < > 0.53 0.53 0.30 0.23*  CREATININE  --   --   --  0.98  --  1.03 1.02  TROPONINIHS 2,594* 2,193*  --   --   --   --   --    < > = values in this interval not displayed.    Assessment: 75yo male subtherapeutic on heparin after resumed post-cath; no gtt issues or signs of bleeding per RN.  Goal of Therapy:  Heparin level 0.3-0.7 units/ml   Plan:  Will increase heparin gtt to previously therapeutic rate of 1600 units/hr and check level in 6 hours.    Wynona Neat, PharmD, BCPS  07/07/2020,4:06 AM

## 2020-07-07 NOTE — Progress Notes (Signed)
ANTICOAGULATION CONSULT NOTE - Follow Up Consult  Pharmacy Consult for heparin Indication: CAD awaiting CABG  Labs: Recent Labs    07/04/20 1332 07/04/20 1939 07/05/20 0018 07/05/20 0902 07/06/20 0126 07/07/20 0256 07/07/20 1120  HGB  --    < > 11.2*  --  11.4* 10.6*  --   HCT  --   --  35.2*  --  36.0* 33.0*  --   PLT  --   --  269  --  310 248  --   HEPARINUNFRC  --    < > 0.53   < > 0.30 0.23* 0.34  CREATININE  --   --  0.98  --  1.03 1.02  --   TROPONINIHS 2,193*  --   --   --   --   --   --    < > = values in this interval not displayed.    Assessment: 75yo male now at goal on heparin after resumed post-cath; no gtt issues or signs of bleeding per RN.  Goal of Therapy:  Heparin level 0.3-0.7 units/ml   Plan:  Continue heparin at current rate Planning surgery in am  Erin Hearing PharmD., BCPS Clinical Pharmacist 07/07/2020 12:04 PM

## 2020-07-07 NOTE — Anesthesia Preprocedure Evaluation (Addendum)
Anesthesia Evaluation  Patient identified by MRN, date of birth, ID band Patient awake    Reviewed: Allergy & Precautions, NPO status , Patient's Chart, lab work & pertinent test results  Airway Mallampati: II  TM Distance: >3 FB Neck ROM: Full    Dental  (+) Teeth Intact, Dental Advisory Given   Pulmonary COPD, Current Smoker,    Pulmonary exam normal breath sounds clear to auscultation       Cardiovascular hypertension, Pt. on medications + CAD and + Past MI  Normal cardiovascular exam Rhythm:Regular Rate:Normal  Echo 07/04/20: 1. Hypokinesis of the basal inferolateral wall with overall preserved LV  function.  2. Left ventricular ejection fraction, by estimation, is 55 to 60%. The  left ventricle has normal function. The left ventricle demonstrates  regional wall motion abnormalities (see scoring diagram/findings for  description). There is mild left ventricular  hypertrophy. Left ventricular diastolic parameters are indeterminate.  Elevated left atrial pressure.  3. Right ventricular systolic function is normal. The right ventricular  size is normal. There is mildly elevated pulmonary artery systolic  pressure.  4. The mitral valve is normal in structure. Moderate mitral valve  regurgitation. No evidence of mitral stenosis.  5. The aortic valve is tricuspid. Aortic valve regurgitation is not  visualized. Mild aortic valve sclerosis is present, with no evidence of  aortic valve stenosis.  6. The inferior vena cava is normal in size with greater than 50%  respiratory variability, suggesting right atrial pressure of 3 mmHg.    Neuro/Psych negative neurological ROS  negative psych ROS   GI/Hepatic Neg liver ROS, GERD  Medicated,  Endo/Other  negative endocrine ROS  Renal/GU negative Renal ROS     Musculoskeletal negative musculoskeletal ROS (+)   Abdominal   Peds  Hematology  (+) Blood dyscrasia, anemia  ,   Anesthesia Other Findings   Reproductive/Obstetrics                            Anesthesia Physical Anesthesia Plan  ASA: IV  Anesthesia Plan: General   Post-op Pain Management:    Induction: Intravenous  PONV Risk Score and Plan: 1 and Midazolam  Airway Management Planned: Oral ETT  Additional Equipment: Arterial line, CVP, PA Cath, TEE and Ultrasound Guidance Line Placement  Intra-op Plan:   Post-operative Plan: Post-operative intubation/ventilation  Informed Consent: I have reviewed the patients History and Physical, chart, labs and discussed the procedure including the risks, benefits and alternatives for the proposed anesthesia with the patient or authorized representative who has indicated his/her understanding and acceptance.     Dental advisory given  Plan Discussed with: CRNA  Anesthesia Plan Comments:        Anesthesia Quick Evaluation

## 2020-07-07 NOTE — Progress Notes (Signed)
PROGRESS NOTE    Blake Sutton  XBD:[id number] DOB: May 29, 1945 DOA: 07/03/2020 PCP: No primary care provider on file.  [id number]/2C04C-01   Blake Sutton is a 75 y.o. male with medical history significant for HTN, HLD, COPD, presents by EMS with complaint of Chest pain and SOB.  Found to have a NSTEMI and he is s/p LHC with plans for CABG this week.    Assessment & Plan:   Principal Problem:   NSTEMI (non-ST elevated myocardial infarction) ([facility]) Active Problems:   Essential hypertension   COPD (chronic obstructive pulmonary disease) (HCC)   Hyperlipidemia LDL goal <70   Elevated d-dimer   Leukocytosis   CAP (community acquired pneumonia)   Coronary artery calcification seen on computed tomography   Acute respiratory failure with hypoxia (HCC)   Acute diastolic heart failure (HCC)     NSTEMI (non-ST elevated myocardial infarction) CAD with significant multi-vessel disease --cardiology consulted --Echo showed LV regional wall motion abnormalities  --Left heart cath: significant multi-vessel disease.   --plan for CABG --cont heparin gtt --cont ASA 81 and Lipitor  Acute hypoxic respiratory failure 2/2 Fluid overload 2/2 acute diastolic heart failure from ACS --CT chest showed fluid overload --BNP 1155 --s/p IV lasix 40 mg BID    Essential hypertension --BP intermittently soft -- hold meds    COPD  --stable, no wheezing --cont daily inhaler    Hyperlipidemia - cont Lipitor 80     CAP, ruled out --lung imaging finding more consistent with fluid overload than infection --procal neg --abx d/c'ed    Elevated d-dimer --CTA chest neg for PE    Hypokalemia --monitor and replete PRN  Ascending aortic aneurysm --Ascending aorta measured at 4.4 cm on PE protocol CT --will need annual follow-up.  Current smoker --cessation advised  Urine culture positive-- unclear as to why this was done.  Denies symptoms.  S/p abx for suspected PNA.  Will not treat. No  fever, no WBC count   DVT prophylaxis: ST:[id number] gtt Code Status: Full code  Family Communication:  Level of care: Telemetry Cardiac  Dispo:   The patient is from: home Anticipated d/c is to: home  Patient currently is not medically ready to d/c due to: pending CABG   Subjective and Interval History:  No SOB, no CP  Objective: Vitals:   07/07/20 0312 07/07/20 0420 07/07/20 0616 07/07/20 0700  BP: 91/62 (!) 100/58 (!) 89/59 (!) 95/57  Pulse: (!) 55 70 (!) 56 69  Resp: 18 16  18   Temp: 98.1 F (36.7 C)   98.4 F (36.9 C)  TempSrc: Oral   Oral  SpO2: 96% 96%  98%  Weight:      Height:        Intake/Output Summary (Last 24 hours) at 07/07/2020 1125 Last data filed at 07/07/2020 0827 Gross per 24 hour  Intake 906.6 ml  Output 1975 ml  Net -1068.4 ml   Filed Weights   07/03/20 2301 07/04/20 0400  Weight: 83.9 kg 84.4 kg    Examination:    General: Appearance:     Overweight male in no acute distress     Lungs:     respirations unlabored  Heart:    Normal heart rate. Normal rhythm. No murmurs, rubs, or gallops.   MS:   All extremities are intact.   Neurologic:   Awake, alert, oriented x 3. No apparent focal neurological           defect.     Data Reviewed: I  have personally reviewed following labs and imaging studies  CBC: Recent Labs  Lab 07/03/20 2346 07/04/20 0456 07/05/20 0018 07/06/20 0126 07/07/20 0256  WBC 14.8* 12.0* 15.8* 12.0* 8.1  NEUTROABS 10.5*  --   --   --   --   HGB 13.7 11.8* 11.2* 11.4* 10.6*  HCT 43.1 36.3* 35.2* 36.0* 33.0*  MCV 91.9 89.4 90.3 90.0 90.4  PLT 335 278 269 310 811   Basic Metabolic Panel: Recent Labs  Lab 07/03/20 2346 07/04/20 0456 07/05/20 0018 07/06/20 0126 07/07/20 0256  NA 141 138 139 139 137  K 3.1* 3.5 4.0 3.5 4.1  CL 103 105 101 99 102  CO2 28 24 31 30 28   GLUCOSE 114* 171* 119* 118* 114*  BUN 15 14 21  26* 20  CREATININE 1.00 0.93 0.98 1.03 1.02  CALCIUM 8.8* 8.2* 8.5* 8.6* 8.4*  MG  --   --  2.1  2.1 2.1   GFR: Estimated Creatinine Clearance: 66.6 mL/min (by C-G formula based on SCr of 1.02 mg/dL). Liver Function Tests: Recent Labs  Lab 07/03/20 2346  AST 24  ALT 24  ALKPHOS 198*  BILITOT 0.5  PROT 7.0  ALBUMIN 3.2*   No results for input(s): LIPASE, AMYLASE in the last 168 hours. No results for input(s): AMMONIA in the last 168 hours. Coagulation Profile: Recent Labs  Lab 07/03/20 2346  INR 1.0   Cardiac Enzymes: No results for input(s): CKTOTAL, CKMB, CKMBINDEX, TROPONINI in the last 168 hours. BNP (last 3 results) No results for input(s): PROBNP in the last 8760 hours. HbA1C: Recent Labs    07/05/20 0018  HGBA1C 5.9*   CBG: No results for input(s): GLUCAP in the last 168 hours. Lipid Profile: No results for input(s): CHOL, HDL, LDLCALC, TRIG, CHOLHDL, LDLDIRECT in the last 72 hours. Thyroid Function Tests: No results for input(s): TSH, T4TOTAL, FREET4, T3FREE, THYROIDAB in the last 72 hours. Anemia Panel: No results for input(s): VITAMINB12, FOLATE, FERRITIN, TIBC, IRON, RETICCTPCT in the last 72 hours. Sepsis Labs: Recent Labs  Lab 07/03/20 2328 07/04/20 0128 07/04/20 0938  PROCALCITON  --   --  <0.10  LATICACIDVEN 1.9 1.3  --     Recent Results (from the past 240 hour(s))  Blood culture (routine single)     Status: None (Preliminary result)   Collection Time: 07/03/20 11:30 PM   Specimen: BLOOD  Result Value Ref Range Status   Specimen Description BLOOD SITE NOT SPECIFIED  Final   Special Requests   Final    BOTTLES DRAWN AEROBIC AND ANAEROBIC Blood Culture adequate volume   Culture   Final    NO GROWTH 4 DAYS Performed at [facility], [place] [address], [place], [place] [address]    Report Status PENDING  Incomplete  Culture, blood (single)     Status: None (Preliminary result)   Collection Time: 07/03/20 11:30 PM   Specimen: BLOOD  Result Value Ref Range Status   Specimen Description BLOOD SITE NOT SPECIFIED  Final   Special  Requests   Final    BOTTLES DRAWN AEROBIC AND ANAEROBIC Blood Culture results may not be optimal due to an inadequate volume of blood received in culture bottles   Culture   Final    NO GROWTH 3 DAYS Performed at [facility], [place] [address], [place], [place] [address]    Report Status PENDING  Incomplete  SARS CORONAVIRUS 2 (TAT 6-24 HRS) Nasopharyngeal Nasopharyngeal Swab     Status: None  Collection Time: 07/03/20 11:31 PM   Specimen: Nasopharyngeal Swab  Result Value Ref Range Status   SARS Coronavirus 2 NEGATIVE NEGATIVE Final    Comment: (NOTE) SARS-CoV-2 target nucleic acids are NOT DETECTED.  The SARS-CoV-2 RNA is generally detectable in upper and lower respiratory specimens during the acute phase of infection. Negative results do not preclude SARS-CoV-2 infection, do not rule out co-infections with other pathogens, and should not be used as the sole basis for treatment or other patient management decisions. Negative results must be combined with clinical observations, patient history, and epidemiological information. The expected result is Negative.  Fact Sheet for Patients: SugarRoll.be  Fact Sheet for Healthcare Providers: https://www.woods-mathews.com/  This test is not yet approved or cleared by the [place] FDA and  has been authorized for detection and/or diagnosis of SARS-CoV-2 by FDA under an Emergency Use Authorization (EUA). This EUA will remain  in effect (meaning this test can be used) for the duration of the COVID-19 declaration under Se ction 564(b)(1) of the Act, 21 U.S.C. section 360bbb-3(b)(1), unless the authorization is terminated or revoked sooner.  Performed at [facility], [place] [address], [place], [place] [address]   Urine culture     Status: Abnormal   Collection Time: 07/04/20  2:22 AM   Specimen: In/Out Cath Urine  Result Value Ref Range Status   Specimen Description IN/OUT CATH  URINE  Final   Special Requests   Final    NONE Performed at [name], [place] [address] [place], [place] [address]    Culture >=100,000 COLONIES/mL KLEBSIELLA PNEUMONIAE (A)  Final   Report Status 07/06/2020 FINAL  Final   Organism ID, Bacteria KLEBSIELLA PNEUMONIAE (A)  Final      Susceptibility   Klebsiella pneumoniae - MIC*    AMPICILLIN >=32 RESISTANT Resistant     CEFAZOLIN <=4 SENSITIVE Sensitive     CEFEPIME <=0.12 SENSITIVE Sensitive     CEFTRIAXONE <=0.25 SENSITIVE Sensitive     CIPROFLOXACIN <=0.25 SENSITIVE Sensitive     GENTAMICIN <=1 SENSITIVE Sensitive     IMIPENEM <=0.25 SENSITIVE Sensitive     NITROFURANTOIN 64 INTERMEDIATE Intermediate     TRIMETH/SULFA <=20 SENSITIVE Sensitive     AMPICILLIN/SULBACTAM 4 SENSITIVE Sensitive     PIP/TAZO <=4 SENSITIVE Sensitive     * >=100,000 COLONIES/mL KLEBSIELLA PNEUMONIAE  MRSA PCR Screening     Status: None   Collection Time: 07/04/20  4:46 AM   Specimen: Nasal Mucosa; Nasopharyngeal  Result Value Ref Range Status   MRSA by PCR NEGATIVE NEGATIVE Final    Comment:        The GeneXpert MRSA Assay (FDA approved for NASAL specimens only), is one component of a comprehensive MRSA colonization surveillance program. It is not intended to diagnose MRSA infection nor to guide or monitor treatment for MRSA infections. Performed at [facility], [place] [address], [place], [place] [address]       Radiology Studies: CARDIAC CATHETERIZATION  Result Date: 07/06/2020  1st Mrg lesion is 75% stenosed.  Mid Cx lesion is 99% stenosed.  Ost LAD to Prox LAD lesion is 60% stenosed.  Mid LAD lesion is 90% stenosed. Ectatic area in the mid LAD at the end of the stenosis.  Mid LAD-2 lesion is 50% stenosed.  LV end diastolic pressure is normal.  There is no aortic valve stenosis.  Left dominant circulation.   Calcific OM1 and LAD disease.  Moderate ostial LAD disease.  Culprit was  lesion in the circumflex that was heavily  thrombotic with high risk of distal embolization if PCI attempted.  Continue anticoagulation.  Cardiac surgery consult.  Consider GP IIb/IIIa inhibitor depending on timing of the surgery.     Scheduled Meds: . aspirin EC  81 mg Oral Daily  . atorvastatin  80 mg Oral QHS  . buPROPion  100 mg Oral BID  . FLUoxetine  20 mg Oral Daily  . pantoprazole  40 mg Oral Daily  . sodium chloride flush  3 mL Intravenous Q12H  . sodium chloride flush  3 mL Intravenous Q12H  . tamsulosin  0.4 mg Oral Daily  . traZODone  50 mg Oral QHS  . umeclidinium bromide  1 puff Inhalation Daily   Continuous Infusions: . sodium chloride    . heparin 1,600 Units/hr (07/07/20 0600)     LOS: 3 days     Geradine Girt, DO [facility] If 7PM-7AM, please contact night-coverage 07/07/2020, 11:25 AM

## 2020-07-07 NOTE — Progress Notes (Addendum)
Progress Note  Patient Name: Blake Sutton Date of Encounter: 07/07/2020  [facility] Cardiologist: Candee Furbish, MD   Subjective   No chest pain this am. No dyspnea. Sinus pauses noted on telemetry  Last night with bradycardia.   Inpatient Medications    Scheduled Meds: . aspirin EC  81 mg Oral Daily  . atorvastatin  80 mg Oral QHS  . buPROPion  100 mg Oral BID  . carvedilol  12.5 mg Oral BID WC  . FLUoxetine  20 mg Oral Daily  . pantoprazole  40 mg Oral Daily  . sodium chloride flush  3 mL Intravenous Q12H  . sodium chloride flush  3 mL Intravenous Q12H  . tamsulosin  0.4 mg Oral Daily  . traZODone  50 mg Oral QHS  . umeclidinium bromide  1 puff Inhalation Daily   Continuous Infusions: . sodium chloride    . heparin 1,600 Units/hr (07/07/20 0600)   PRN Meds: sodium chloride, acetaminophen, albuterol, LORazepam, nitroGLYCERIN, sodium chloride flush, zolpidem   Vital Signs    Vitals:   07/06/20 2300 07/07/20 0312 07/07/20 0420 07/07/20 0616  BP: 102/64 91/62 (!) 100/58 (!) 89/59  Pulse: 64 (!) 55 70 (!) 56  Resp: 16 18 16    Temp: 98.5 F (36.9 C) 98.1 F (36.7 C)    TempSrc: Oral Oral    SpO2: 98% 96% 96%   Weight:      Height:        Intake/Output Summary (Last 24 hours) at 07/07/2020 0728 Last data filed at 07/07/2020 0600 Gross per 24 hour  Intake 903.6 ml  Output 2125 ml  Net -1221.4 ml   Last 3 Weights 07/04/2020 07/03/2020  Weight (lbs) 186 lb 1.1 oz 185 lb  Weight (kg) 84.4 kg 83.915 kg      Telemetry    Sinus brady with sinus pauses noted last night- Personally Reviewed  ECG     No AM EKG- Personally Reviewed  Physical Exam   GEN: No acute distress.   Neck: No JVD Cardiac: RRR, no murmurs, rubs, or gallops.  Respiratory: Clear to auscultation bilaterally. GI: Soft, nontender, non-distended  MS: No edema; No deformity. Neuro:  Nonfocal  Psych: Normal affect   Labs    High Sensitivity Troponin:   Recent Labs  Lab 07/03/20 2346  07/04/20 0304 07/04/20 0938 07/04/20 1332  TROPONINIHS 367* 1,224* 2,594* 2,193*      Chemistry Recent Labs  Lab 07/03/20 2346 07/04/20 0456 07/05/20 0018 07/06/20 0126 07/07/20 0256  NA 141   < > 139 139 137  K 3.1*   < > 4.0 3.5 4.1  CL 103   < > 101 99 102  CO2 28   < > 31 30 28   GLUCOSE 114*   < > 119* 118* 114*  BUN 15   < > 21 26* 20  CREATININE 1.00   < > 0.98 1.03 1.02  CALCIUM 8.8*   < > 8.5* 8.6* 8.4*  PROT 7.0  --   --   --   --   ALBUMIN 3.2*  --   --   --   --   AST 24  --   --   --   --   ALT 24  --   --   --   --   ALKPHOS 198*  --   --   --   --   BILITOT 0.5  --   --   --   --  GFRNONAA >60   < > >60 >60 >60  ANIONGAP 10   < > 7 10 7    < > = values in this interval not displayed.     Hematology Recent Labs  Lab 07/05/20 0018 07/06/20 0126 07/07/20 0256  WBC 15.8* 12.0* 8.1  RBC 3.90* 4.00* 3.65*  HGB 11.2* 11.4* 10.6*  HCT 35.2* 36.0* 33.0*  MCV 90.3 90.0 90.4  MCH 28.7 28.5 29.0  MCHC 31.8 31.7 32.1  RDW 14.7 14.6 14.4  PLT 269 310 248    BNP Recent Labs  Lab 07/04/20 0456  BNP 1,155.9*     DDimer  Recent Labs  Lab 07/03/20 2346  DDIMER 2.49*     Radiology    CARDIAC CATHETERIZATION  Result Date: 07/06/2020  1st Mrg lesion is 75% stenosed.  Mid Cx lesion is 99% stenosed.  Ost LAD to Prox LAD lesion is 60% stenosed.  Mid LAD lesion is 90% stenosed. Ectatic area in the mid LAD at the end of the stenosis.  Mid LAD-2 lesion is 50% stenosed.  LV end diastolic pressure is normal.  There is no aortic valve stenosis.  Left dominant circulation.   Calcific OM1 and LAD disease.  Moderate ostial LAD disease.  Culprit was lesion in the circumflex that was heavily thrombotic with high risk of distal embolization if PCI attempted.  Continue anticoagulation.  Cardiac surgery consult.  Consider GP IIb/IIIa inhibitor depending on timing of the surgery.    Cardiac Studies     Echo 07/04/2020: Normal LVEF of 55 to 60% with basal  inferolateral HK.  Mild LVH.  Indeterminate diastolic pressures with elevated LAP.  Mildly elevated PAP with normal RAP.Marland Kitchen  Moderate MR.  Mild aortic sclerosis with no stenosis..   Patient Profile     75 y.o. male with history of hypertension, hyperlipidemia and COPD presenting with chest pain and found to have a NSTEMI. Cardiac cath 07/06/20 with severe double vessel CAD involving the LAD and Circumflex. CT surgery has been consulted for CABG.   Assessment & Plan    1. CAD/NSTEMI: Cardiac cath 07/06/20 with severe LAD and circumflex disease. Dr. Irish Lack performed his cath and felt that the best option for revascularization was CABG. He has no chest pain today. He has been seen by the CT surgery team and planning underway for CABG later this week.  -Continue ASA and statin -Hold beta blocker given pauses and bradycardia overnight.  -Continue IV heparin  2. Hyperlipidemia: Continue statin  3. HTN: BP soft this am. Hold beta blocker.   4. Elevated d-dimer: Negative PE protocol CT   5. Ascending aortic aneurysm: Ascending aorta measured at 4.4 cm on PE protocol CT will need annual follow-up.   6. Tobacco abuse: Cessation recommended.   7. Sinus pause/bradycardia: Will d/c his Coreg today.   For questions or updates, please contact [place] Please consult [email or web address] for contact info under        Signed, Lauree Chandler, MD  07/07/2020, 7:28 AM

## 2020-07-08 ENCOUNTER — Inpatient Hospital Stay (HOSPITAL_COMMUNITY): Payer: Medicare Other | Admitting: Registered Nurse

## 2020-07-08 ENCOUNTER — Inpatient Hospital Stay (HOSPITAL_COMMUNITY): Payer: Medicare Other

## 2020-07-08 ENCOUNTER — Inpatient Hospital Stay (HOSPITAL_COMMUNITY)
Admission: EM | Disposition: A | Discharge status: Home / Self Care | Payer: Self-pay | Source: Home / Self Care | Attending: Thoracic Surgery (Cardiothoracic Vascular Surgery)

## 2020-07-08 DIAGNOSIS — Z951 Presence of aortocoronary bypass graft: Secondary | ICD-10-CM

## 2020-07-08 HISTORY — PX: TEE WITHOUT CARDIOVERSION: SHX5443

## 2020-07-08 HISTORY — PX: CORONARY ARTERY BYPASS GRAFT: SHX141

## 2020-07-08 LAB — BASIC METABOLIC PANEL
Anion gap: 6 (ref 5–15)
Anion gap: 7 (ref 5–15)
BUN: 19 mg/dL (ref 8–23)
BUN: 16 mg/dL (ref 8–23)
CO2: 29 mmol/L (ref 22–32)
CO2: 25 mmol/L (ref 22–32)
Calcium: 8.5 mg/dL — ABNORMAL LOW (ref 8.9–10.3)
Calcium: 8.3 mg/dL — ABNORMAL LOW (ref 8.9–10.3)
Chloride: 103 mmol/L (ref 98–111)
Chloride: 104 mmol/L (ref 98–111)
Creatinine, Ser: 1.18 mg/dL (ref 0.61–1.24)
Creatinine, Ser: 1.09 mg/dL (ref 0.61–1.24)
GFR, Estimated: >60 mL/min (ref >60)
GFR, Estimated: >60 mL/min (ref >60)
Glucose, Bld: 108 mg/dL — ABNORMAL HIGH (ref 70–99)
Glucose, Bld: 132 mg/dL — ABNORMAL HIGH (ref 70–99)
Potassium: 4.1 mmol/L (ref 3.5–5.1)
Potassium: 5 mmol/L (ref 3.5–5.1)
Sodium: 138 mmol/L (ref 135–145)
Sodium: 136 mmol/L (ref 135–145)

## 2020-07-08 LAB — CBC
HCT: 34.1 % — ABNORMAL LOW (ref 39.0–52.0)
HCT: 28.6 % — ABNORMAL LOW (ref 39.0–52.0)
HCT: 26.6 % — ABNORMAL LOW (ref 39.0–52.0)
HCT: 27.3 % — ABNORMAL LOW (ref 39.0–52.0)
Hemoglobin: 11.1 g/dL — ABNORMAL LOW (ref 13.0–17.0)
Hemoglobin: 8.9 g/dL — ABNORMAL LOW (ref 13.0–17.0)
Hemoglobin: 8.4 g/dL — ABNORMAL LOW (ref 13.0–17.0)
Hemoglobin: 8.7 g/dL — ABNORMAL LOW (ref 13.0–17.0)
MCH: 29.2 pg (ref 26.0–34.0)
MCH: 28.7 pg (ref 26.0–34.0)
MCH: 29.2 pg (ref 26.0–34.0)
MCH: 29.4 pg (ref 26.0–34.0)
MCHC: 32.6 g/dL (ref 30.0–36.0)
MCHC: 31.1 g/dL (ref 30.0–36.0)
MCHC: 31.6 g/dL (ref 30.0–36.0)
MCHC: 31.9 g/dL (ref 30.0–36.0)
MCV: 89.7 fL (ref 80.0–100.0)
MCV: 92.3 fL (ref 80.0–100.0)
MCV: 92.4 fL (ref 80.0–100.0)
MCV: 92.2 fL (ref 80.0–100.0)
Platelets: 270 10*3/uL (ref 150–400)
Platelets: 183 10*3/uL (ref 150–400)
Platelets: 157 10*3/uL (ref 150–400)
Platelets: 164 10*3/uL (ref 150–400)
RBC: 3.8 MIL/uL — ABNORMAL LOW (ref 4.22–5.81)
RBC: 3.1 MIL/uL — ABNORMAL LOW (ref 4.22–5.81)
RBC: 2.88 MIL/uL — ABNORMAL LOW (ref 4.22–5.81)
RBC: 2.96 MIL/uL — ABNORMAL LOW (ref 4.22–5.81)
RDW: 14.3 % (ref 11.5–15.5)
RDW: 14.3 % (ref 11.5–15.5)
RDW: 14.2 % (ref 11.5–15.5)
RDW: 14.3 % (ref 11.5–15.5)
WBC: 8.3 10*3/uL (ref 4.0–10.5)
WBC: 9.5 10*3/uL (ref 4.0–10.5)
WBC: 9.6 10*3/uL (ref 4.0–10.5)
WBC: 11.4 10*3/uL — ABNORMAL HIGH (ref 4.0–10.5)
nRBC: 0 % (ref 0.0–0.2)
nRBC: 0 % (ref 0.0–0.2)
nRBC: 0 % (ref 0.0–0.2)
nRBC: 0 % (ref 0.0–0.2)

## 2020-07-08 LAB — POCT I-STAT, CHEM 8
BUN: 19 mg/dL (ref 8–23)
BUN: 18 mg/dL (ref 8–23)
BUN: 17 mg/dL (ref 8–23)
BUN: 17 mg/dL (ref 8–23)
Calcium, Ion: 1.19 mmol/L (ref 1.15–1.40)
Calcium, Ion: 1.18 mmol/L (ref 1.15–1.40)
Calcium, Ion: 1.1 mmol/L — ABNORMAL LOW (ref 1.15–1.40)
Calcium, Ion: 1.29 mmol/L (ref 1.15–1.40)
Chloride: 102 mmol/L (ref 98–111)
Chloride: 102 mmol/L (ref 98–111)
Chloride: 101 mmol/L (ref 98–111)
Chloride: 102 mmol/L (ref 98–111)
Creatinine, Ser: 1 mg/dL (ref 0.61–1.24)
Creatinine, Ser: 1 mg/dL (ref 0.61–1.24)
Creatinine, Ser: 0.9 mg/dL (ref 0.61–1.24)
Creatinine, Ser: 0.9 mg/dL (ref 0.61–1.24)
Glucose, Bld: 108 mg/dL — ABNORMAL HIGH (ref 70–99)
Glucose, Bld: 113 mg/dL — ABNORMAL HIGH (ref 70–99)
Glucose, Bld: 107 mg/dL — ABNORMAL HIGH (ref 70–99)
Glucose, Bld: 124 mg/dL — ABNORMAL HIGH (ref 70–99)
HCT: 30 % — ABNORMAL LOW (ref 39.0–52.0)
HCT: 27 % — ABNORMAL LOW (ref 39.0–52.0)
HCT: 22 % — ABNORMAL LOW (ref 39.0–52.0)
HCT: 23 % — ABNORMAL LOW (ref 39.0–52.0)
Hemoglobin: 10.2 g/dL — ABNORMAL LOW (ref 13.0–17.0)
Hemoglobin: 9.2 g/dL — ABNORMAL LOW (ref 13.0–17.0)
Hemoglobin: 7.5 g/dL — ABNORMAL LOW (ref 13.0–17.0)
Hemoglobin: 7.8 g/dL — ABNORMAL LOW (ref 13.0–17.0)
Potassium: 4.1 mmol/L (ref 3.5–5.1)
Potassium: 4.2 mmol/L (ref 3.5–5.1)
Potassium: 5 mmol/L (ref 3.5–5.1)
Potassium: 4.3 mmol/L (ref 3.5–5.1)
Sodium: 139 mmol/L (ref 135–145)
Sodium: 138 mmol/L (ref 135–145)
Sodium: 137 mmol/L (ref 135–145)
Sodium: 138 mmol/L (ref 135–145)
TCO2: 29 mmol/L (ref 22–32)
TCO2: 26 mmol/L (ref 22–32)
TCO2: 28 mmol/L (ref 22–32)
TCO2: 25 mmol/L (ref 22–32)

## 2020-07-08 LAB — POCT I-STAT EG7
Acid-Base Excess: 4 mmol/L — ABNORMAL HIGH (ref 0.0–2.0)
Bicarbonate: 28.7 mmol/L — ABNORMAL HIGH (ref 20.0–28.0)
Calcium, Ion: 1.08 mmol/L — ABNORMAL LOW (ref 1.15–1.40)
HCT: 24 % — ABNORMAL LOW (ref 39.0–52.0)
Hemoglobin: 8.2 g/dL — ABNORMAL LOW (ref 13.0–17.0)
O2 Saturation: 79 %
Potassium: 4.4 mmol/L (ref 3.5–5.1)
Sodium: 140 mmol/L (ref 135–145)
TCO2: 30 mmol/L (ref 22–32)
pCO2, Ven: 43 mmHg — ABNORMAL LOW (ref 44.0–60.0)
pH, Ven: 7.432 — ABNORMAL HIGH (ref 7.250–7.430)
pO2, Ven: 42 mmHg (ref 32.0–45.0)

## 2020-07-08 LAB — POCT I-STAT 7, (LYTES, BLD GAS, ICA,H+H)
Acid-Base Excess: 4 mmol/L — ABNORMAL HIGH (ref 0.0–2.0)
Acid-Base Excess: 6 mmol/L — ABNORMAL HIGH (ref 0.0–2.0)
Acid-Base Excess: 0 mmol/L (ref 0.0–2.0)
Bicarbonate: 28.3 mmol/L — ABNORMAL HIGH (ref 20.0–28.0)
Bicarbonate: 30.7 mmol/L — ABNORMAL HIGH (ref 20.0–28.0)
Bicarbonate: 25.7 mmol/L (ref 20.0–28.0)
Calcium, Ion: 0.97 mmol/L — ABNORMAL LOW (ref 1.15–1.40)
Calcium, Ion: 1.11 mmol/L — ABNORMAL LOW (ref 1.15–1.40)
Calcium, Ion: 1.26 mmol/L (ref 1.15–1.40)
HCT: 23 % — ABNORMAL LOW (ref 39.0–52.0)
HCT: 24 % — ABNORMAL LOW (ref 39.0–52.0)
HCT: 27 % — ABNORMAL LOW (ref 39.0–52.0)
Hemoglobin: 7.8 g/dL — ABNORMAL LOW (ref 13.0–17.0)
Hemoglobin: 8.2 g/dL — ABNORMAL LOW (ref 13.0–17.0)
Hemoglobin: 9.2 g/dL — ABNORMAL LOW (ref 13.0–17.0)
O2 Saturation: 100 %
O2 Saturation: 100 %
O2 Saturation: 97 %
Patient temperature: 36
Potassium: 4.9 mmol/L (ref 3.5–5.1)
Potassium: 4.9 mmol/L (ref 3.5–5.1)
Potassium: 4.2 mmol/L (ref 3.5–5.1)
Sodium: 139 mmol/L (ref 135–145)
Sodium: 136 mmol/L (ref 135–145)
Sodium: 140 mmol/L (ref 135–145)
TCO2: 30 mmol/L (ref 22–32)
TCO2: 32 mmol/L (ref 22–32)
TCO2: 27 mmol/L (ref 22–32)
pCO2 arterial: 40.8 mmHg (ref 32.0–48.0)
pCO2 arterial: 42.6 mmHg (ref 32.0–48.0)
pCO2 arterial: 41.4 mmHg (ref 32.0–48.0)
pH, Arterial: 7.45 (ref 7.350–7.450)
pH, Arterial: 7.465 — ABNORMAL HIGH (ref 7.350–7.450)
pH, Arterial: 7.396 (ref 7.350–7.450)
pO2, Arterial: 355 mmHg — ABNORMAL HIGH (ref 83.0–108.0)
pO2, Arterial: 298 mmHg — ABNORMAL HIGH (ref 83.0–108.0)
pO2, Arterial: 91 mmHg (ref 83.0–108.0)

## 2020-07-08 LAB — BLOOD GAS, ARTERIAL
Acid-Base Excess: 4.7 mmol/L — ABNORMAL HIGH (ref 0.0–2.0)
Bicarbonate: 28.6 mmol/L — ABNORMAL HIGH (ref 20.0–28.0)
Drawn by: 44166
FIO2: 21
O2 Saturation: 91.7 %
Patient temperature: 36.7
pCO2 arterial: 41.8 mmHg (ref 32.0–48.0)
pH, Arterial: 7.449 (ref 7.350–7.450)
pO2, Arterial: 62.5 mmHg — ABNORMAL LOW (ref 83.0–108.0)

## 2020-07-08 LAB — SURGICAL PCR SCREEN
MRSA, PCR: NEGATIVE
Staphylococcus aureus: NEGATIVE

## 2020-07-08 LAB — PROTIME-INR
INR: 1.3 — ABNORMAL HIGH (ref 0.8–1.2)
Prothrombin Time: 16.6 seconds — ABNORMAL HIGH (ref 11.4–15.2)

## 2020-07-08 LAB — CULTURE, BLOOD (SINGLE)
Culture: NO GROWTH
Special Requests: ADEQUATE

## 2020-07-08 LAB — ECHO INTRAOPERATIVE TEE
Height: 70 in
Weight: 2881.85 oz

## 2020-07-08 LAB — APTT: aPTT: 34 seconds (ref 24–36)

## 2020-07-08 LAB — PREPARE RBC (CROSSMATCH)

## 2020-07-08 LAB — GLUCOSE, CAPILLARY
Glucose-Capillary: 120 mg/dL — ABNORMAL HIGH (ref 70–99)
Glucose-Capillary: 105 mg/dL — ABNORMAL HIGH (ref 70–99)
Glucose-Capillary: 121 mg/dL — ABNORMAL HIGH (ref 70–99)
Glucose-Capillary: 121 mg/dL — ABNORMAL HIGH (ref 70–99)
Glucose-Capillary: 124 mg/dL — ABNORMAL HIGH (ref 70–99)
Glucose-Capillary: 130 mg/dL — ABNORMAL HIGH (ref 70–99)
Glucose-Capillary: 120 mg/dL — ABNORMAL HIGH (ref 70–99)

## 2020-07-08 LAB — MAGNESIUM
Magnesium: 2.3 mg/dL (ref 1.7–2.4)
Magnesium: 3.3 mg/dL — ABNORMAL HIGH (ref 1.7–2.4)

## 2020-07-08 LAB — HEMOGLOBIN AND HEMATOCRIT, BLOOD
HCT: 25.4 % — ABNORMAL LOW (ref 39.0–52.0)
Hemoglobin: 8.2 g/dL — ABNORMAL LOW (ref 13.0–17.0)

## 2020-07-08 LAB — HEPARIN LEVEL (UNFRACTIONATED): Heparin Unfractionated: 0.48 IU/mL (ref 0.30–0.70)

## 2020-07-08 LAB — PLATELET COUNT: Platelets: 220 10*3/uL (ref 150–400)

## 2020-07-08 SURGERY — CORONARY ARTERY BYPASS GRAFTING (CABG)
Anesthesia: General | Site: Chest

## 2020-07-08 MED ORDER — PROPOFOL 10 MG/ML IV BOLUS
INTRAVENOUS | Status: AC
  Filled 2020-07-08: qty 40

## 2020-07-08 MED ORDER — ASPIRIN EC 325 MG PO TBEC
325.0000 mg | DELAYED_RELEASE_TABLET | Freq: Every day | ORAL | Status: DC
  Administered 2020-07-09 – 2020-07-13 (×5): 325 mg via ORAL
  Filled 2020-07-08 (×5): qty 1

## 2020-07-08 MED ORDER — POTASSIUM CHLORIDE 10 MEQ/50ML IV SOLN: 10.0000 meq | INTRAVENOUS | Status: AC

## 2020-07-08 MED ORDER — NOREPINEPHRINE BITARTRATE 1 MG/ML IV SOLN
INTRAVENOUS | Status: DC | PRN
  Administered 2020-07-08: 8 mL via INTRAVENOUS
  Administered 2020-07-08: 4 mL via INTRAVENOUS
  Administered 2020-07-08: 8 mL via INTRAVENOUS

## 2020-07-08 MED ORDER — ALBUMIN HUMAN 5 % IV SOLN: INTRAVENOUS | Status: DC | PRN

## 2020-07-08 MED ORDER — ACETAMINOPHEN 160 MG/5ML PO SOLN: 650.0000 mg | Freq: Once | ORAL | Status: AC

## 2020-07-08 MED ORDER — ACETAMINOPHEN 160 MG/5ML PO SOLN: 1000.0000 mg | Freq: Four times a day (QID) | ORAL | Status: DC

## 2020-07-08 MED ORDER — METOPROLOL TARTRATE 5 MG/5ML IV SOLN
2.5000 mg | INTRAVENOUS | Status: DC | PRN
Start: 2020-07-08 — End: 2020-07-13

## 2020-07-08 MED ORDER — MAGNESIUM SULFATE 4 GM/100ML IV SOLN
4.0000 g | Freq: Once | INTRAVENOUS | Status: AC
  Administered 2020-07-08: 4 g via INTRAVENOUS
  Filled 2020-07-08: qty 100

## 2020-07-08 MED ORDER — NOREPINEPHRINE 4 MG/250ML-% IV SOLN: 0.0000 ug/min | INTRAVENOUS | Status: DC

## 2020-07-08 MED ORDER — PROTAMINE SULFATE 10 MG/ML IV SOLN
INTRAVENOUS | Status: DC | PRN
  Administered 2020-07-08: 10 mg via INTRAVENOUS
  Administered 2020-07-08: 250 mg via INTRAVENOUS

## 2020-07-08 MED ORDER — VANCOMYCIN HCL IN DEXTROSE 1-5 GM/200ML-% IV SOLN
1000.0000 mg | Freq: Once | INTRAVENOUS | Status: AC
  Administered 2020-07-08: 1000 mg via INTRAVENOUS
  Filled 2020-07-08: qty 200

## 2020-07-08 MED ORDER — CHLORHEXIDINE GLUCONATE 0.12 % MT SOLN
15.0000 mL | OROMUCOSAL | Status: AC
  Administered 2020-07-08: 15 mL via OROMUCOSAL

## 2020-07-08 MED ORDER — FENTANYL CITRATE (PF) 250 MCG/5ML IJ SOLN
INTRAMUSCULAR | Status: AC
  Filled 2020-07-08: qty 25

## 2020-07-08 MED ORDER — ROCURONIUM BROMIDE 10 MG/ML (PF) SYRINGE
PREFILLED_SYRINGE | INTRAVENOUS | Status: DC | PRN
  Administered 2020-07-08: 100 mg via INTRAVENOUS
  Administered 2020-07-08 (×3): 50 mg via INTRAVENOUS

## 2020-07-08 MED ORDER — FAMOTIDINE IN NACL 20-0.9 MG/50ML-% IV SOLN
20.0000 mg | Freq: Two times a day (BID) | INTRAVENOUS | Status: AC
  Administered 2020-07-08: 20 mg via INTRAVENOUS
  Filled 2020-07-08 (×2): qty 50

## 2020-07-08 MED ORDER — PHENYLEPHRINE HCL-NACL 20-0.9 MG/250ML-% IV SOLN: 0.0000 ug/min | INTRAVENOUS | Status: DC

## 2020-07-08 MED ORDER — SODIUM CHLORIDE 0.45 % IV SOLN: INTRAVENOUS | Status: DC | PRN

## 2020-07-08 MED ORDER — LACTATED RINGERS IV SOLN: INTRAVENOUS | Status: DC

## 2020-07-08 MED ORDER — MIDAZOLAM HCL (PF) 5 MG/ML IJ SOLN
INTRAMUSCULAR | Status: DC | PRN
  Administered 2020-07-08 (×3): 2 mg via INTRAVENOUS
  Administered 2020-07-08: 1 mg via INTRAVENOUS
  Administered 2020-07-08: 2 mg via INTRAVENOUS
  Administered 2020-07-08: 1 mg via INTRAVENOUS

## 2020-07-08 MED ORDER — MIDAZOLAM HCL (PF) 10 MG/2ML IJ SOLN
INTRAMUSCULAR | Status: AC
  Filled 2020-07-08: qty 2

## 2020-07-08 MED ORDER — LACTATED RINGERS IV SOLN: INTRAVENOUS | Status: DC | PRN

## 2020-07-08 MED ORDER — ACETAMINOPHEN 650 MG RE SUPP
650.0000 mg | Freq: Once | RECTAL | Status: AC
  Administered 2020-07-08: 650 mg via RECTAL

## 2020-07-08 MED ORDER — PROPOFOL 10 MG/ML IV BOLUS
INTRAVENOUS | Status: DC | PRN
  Administered 2020-07-08: 80 mg via INTRAVENOUS

## 2020-07-08 MED ORDER — SODIUM CHLORIDE 0.9 % IV SOLN: 250.0000 mL | INTRAVENOUS | Status: DC

## 2020-07-08 MED ORDER — SODIUM CHLORIDE 0.9% FLUSH: 3.0000 mL | INTRAVENOUS | Status: DC | PRN

## 2020-07-08 MED ORDER — DEXMEDETOMIDINE HCL IN NACL 400 MCG/100ML IV SOLN
0.0000 ug/kg/h | INTRAVENOUS | Status: DC
  Administered 2020-07-08: 0.2 ug/kg/h via INTRAVENOUS
  Filled 2020-07-08: qty 100

## 2020-07-08 MED ORDER — DEXTROSE 50 % IV SOLN: 0.0000 mL | INTRAVENOUS | Status: DC | PRN

## 2020-07-08 MED ORDER — SODIUM CHLORIDE 0.9% FLUSH
3.0000 mL | Freq: Two times a day (BID) | INTRAVENOUS | Status: DC
  Administered 2020-07-09 – 2020-07-10 (×3): 3 mL via INTRAVENOUS

## 2020-07-08 MED ORDER — PHENYLEPHRINE HCL-NACL 10-0.9 MG/250ML-% IV SOLN
INTRAVENOUS | Status: AC
  Filled 2020-07-08: qty 250

## 2020-07-08 MED ORDER — HEPARIN SODIUM (PORCINE) 1000 UNIT/ML IJ SOLN
INTRAMUSCULAR | Status: DC | PRN
  Administered 2020-07-08: 5000 [IU] via INTRAVENOUS
  Administered 2020-07-08: 26000 [IU] via INTRAVENOUS

## 2020-07-08 MED ORDER — PLASMA-LYTE A IV SOLN: INTRAVENOUS | Status: DC

## 2020-07-08 MED ORDER — ALBUMIN HUMAN 5 % IV SOLN
12.5000 g | Freq: Once | INTRAVENOUS | Status: AC
  Administered 2020-07-08: 12.5 g via INTRAVENOUS

## 2020-07-08 MED ORDER — PHENYLEPHRINE 40 MCG/ML (10ML) SYRINGE FOR IV PUSH (FOR BLOOD PRESSURE SUPPORT)
PREFILLED_SYRINGE | INTRAVENOUS | Status: DC | PRN
  Administered 2020-07-08: 200 ug via INTRAVENOUS
  Administered 2020-07-08 (×2): 120 ug via INTRAVENOUS
  Administered 2020-07-08: 80 ug via INTRAVENOUS
  Administered 2020-07-08 (×2): 120 ug via INTRAVENOUS

## 2020-07-08 MED ORDER — SODIUM CHLORIDE 0.9 % IV SOLN: INTRAVENOUS | Status: DC

## 2020-07-08 MED ORDER — MIDAZOLAM HCL 2 MG/2ML IJ SOLN: 2.0000 mg | INTRAMUSCULAR | Status: DC | PRN

## 2020-07-08 MED ORDER — PANTOPRAZOLE SODIUM 40 MG PO TBEC: 40.0000 mg | DELAYED_RELEASE_TABLET | Freq: Every day | ORAL | Status: DC

## 2020-07-08 MED ORDER — CEFAZOLIN SODIUM-DEXTROSE 2-4 GM/100ML-% IV SOLN
2.0000 g | Freq: Three times a day (TID) | INTRAVENOUS | Status: AC
  Administered 2020-07-08 – 2020-07-10 (×6): 2 g via INTRAVENOUS
  Filled 2020-07-08 (×6): qty 100

## 2020-07-08 MED ORDER — ACETAMINOPHEN 500 MG PO TABS
1000.0000 mg | ORAL_TABLET | Freq: Four times a day (QID) | ORAL | Status: DC
  Administered 2020-07-08 – 2020-07-13 (×15): 1000 mg via ORAL
  Filled 2020-07-08 (×15): qty 2

## 2020-07-08 MED ORDER — DOCUSATE SODIUM 100 MG PO CAPS
200.0000 mg | ORAL_CAPSULE | Freq: Every day | ORAL | Status: DC
  Administered 2020-07-09 – 2020-07-10 (×2): 200 mg via ORAL
  Filled 2020-07-08 (×4): qty 2

## 2020-07-08 MED ORDER — CHLORHEXIDINE GLUCONATE CLOTH 2 % EX PADS
6.0000 | MEDICATED_PAD | Freq: Every day | CUTANEOUS | Status: DC
  Administered 2020-07-08 – 2020-07-11 (×4): 6 via TOPICAL

## 2020-07-08 MED ORDER — METOPROLOL TARTRATE 25 MG/10 ML ORAL SUSPENSION
12.5000 mg | Freq: Two times a day (BID) | ORAL | Status: DC
  Filled 2020-07-08 (×4): qty 5

## 2020-07-08 MED ORDER — EPHEDRINE SULFATE-NACL 50-0.9 MG/10ML-% IV SOSY
PREFILLED_SYRINGE | INTRAVENOUS | Status: DC | PRN
  Administered 2020-07-08 (×4): 10 mg via INTRAVENOUS

## 2020-07-08 MED ORDER — 0.9 % SODIUM CHLORIDE (POUR BTL) OPTIME
TOPICAL | Status: DC | PRN
  Administered 2020-07-08: 1000 mL

## 2020-07-08 MED ORDER — OXYCODONE HCL 5 MG PO TABS: 5.0000 mg | ORAL_TABLET | ORAL | Status: DC | PRN

## 2020-07-08 MED ORDER — FENTANYL CITRATE (PF) 100 MCG/2ML IJ SOLN
25.0000 ug | INTRAMUSCULAR | Status: DC | PRN
  Administered 2020-07-08: 25 ug via INTRAVENOUS
  Filled 2020-07-08: qty 2

## 2020-07-08 MED ORDER — BISACODYL 5 MG PO TBEC
10.0000 mg | DELAYED_RELEASE_TABLET | Freq: Every day | ORAL | Status: DC
  Administered 2020-07-09 – 2020-07-10 (×2): 10 mg via ORAL
  Filled 2020-07-08 (×4): qty 2

## 2020-07-08 MED ORDER — LACTATED RINGERS IV SOLN: 500.0000 mL | Freq: Once | INTRAVENOUS | Status: DC | PRN

## 2020-07-08 MED ORDER — BISACODYL 10 MG RE SUPP: 10.0000 mg | Freq: Every day | RECTAL | Status: DC

## 2020-07-08 MED ORDER — NITROGLYCERIN IN D5W 200-5 MCG/ML-% IV SOLN: 0.0000 ug/min | INTRAVENOUS | Status: DC

## 2020-07-08 MED ORDER — ALBUMIN HUMAN 5 % IV SOLN
250.0000 mL | INTRAVENOUS | Status: AC | PRN
  Administered 2020-07-08 (×4): 12.5 g via INTRAVENOUS
  Filled 2020-07-08 (×2): qty 250

## 2020-07-08 MED ORDER — DOBUTAMINE IN D5W 4-5 MG/ML-% IV SOLN: 0.0000 ug/kg/min | INTRAVENOUS | Status: DC

## 2020-07-08 MED ORDER — SODIUM CHLORIDE (PF) 0.9 % IJ SOLN
OROMUCOSAL | Status: DC | PRN
  Administered 2020-07-08 (×3): 4 mL via TOPICAL

## 2020-07-08 MED ORDER — MIDAZOLAM HCL 5 MG/5ML IJ SOLN: INTRAMUSCULAR | Status: DC | PRN

## 2020-07-08 MED ORDER — METOPROLOL TARTRATE 12.5 MG HALF TABLET
12.5000 mg | ORAL_TABLET | Freq: Two times a day (BID) | ORAL | Status: DC
  Administered 2020-07-09 – 2020-07-13 (×5): 12.5 mg via ORAL
  Filled 2020-07-08 (×6): qty 1

## 2020-07-08 MED ORDER — INSULIN REGULAR(HUMAN) IN NACL 100-0.9 UT/100ML-% IV SOLN: INTRAVENOUS | Status: DC

## 2020-07-08 MED ORDER — ASPIRIN 81 MG PO CHEW
324.0000 mg | CHEWABLE_TABLET | Freq: Every day | ORAL | Status: DC
  Filled 2020-07-08 (×2): qty 4

## 2020-07-08 MED ORDER — FENTANYL CITRATE (PF) 250 MCG/5ML IJ SOLN
INTRAMUSCULAR | Status: DC | PRN
  Administered 2020-07-08: 150 ug via INTRAVENOUS
  Administered 2020-07-08 (×3): 100 ug via INTRAVENOUS
  Administered 2020-07-08: 200 ug via INTRAVENOUS
  Administered 2020-07-08: 50 ug via INTRAVENOUS
  Administered 2020-07-08 (×2): 100 ug via INTRAVENOUS
  Administered 2020-07-08: 50 ug via INTRAVENOUS

## 2020-07-08 MED ORDER — PLASMA-LYTE A IV SOLN
INTRAVENOUS | Status: DC | PRN
  Administered 2020-07-08: 1000 mL

## 2020-07-08 MED ORDER — NICARDIPINE HCL IN NACL 20-0.86 MG/200ML-% IV SOLN
0.0000 mg/h | INTRAVENOUS | Status: DC
  Administered 2020-07-08: 5 mg/h via INTRAVENOUS
  Filled 2020-07-08: qty 200

## 2020-07-08 SURGICAL SUPPLY — 91 items
ADH SKN CLS APL DERMABOND .7 (GAUZE/BANDAGES/DRESSINGS) ×2
BAG DECANTER FOR FLEXI CONT (MISCELLANEOUS) ×4 IMPLANT
BLADE 11 SAFETY STRL DISP (BLADE) ×2 IMPLANT
BLADE CLIPPER SURG (BLADE) ×4 IMPLANT
BLADE STERNUM SYSTEM 6 (BLADE) ×4 IMPLANT
BNDG ELASTIC 4X5.8 VLCR STR LF (GAUZE/BANDAGES/DRESSINGS) ×4 IMPLANT
BNDG ELASTIC 6X5.8 VLCR STR LF (GAUZE/BANDAGES/DRESSINGS) ×4 IMPLANT
BNDG GAUZE ELAST 4 BULKY (GAUZE/BANDAGES/DRESSINGS) ×4 IMPLANT
CABLE SURGICAL S-101-97-12 (CABLE) ×6 IMPLANT
CANISTER SUCT 3000ML PPV (MISCELLANEOUS) ×4 IMPLANT
CANNULA MC2 2 STG 29/37 NON-V (CANNULA) ×2 IMPLANT
CANNULA MC2 TWO STAGE (CANNULA) ×4
CANNULA NON VENT 20FR 12 (CANNULA) ×4 IMPLANT
CATH ROBINSON RED A/P 18FR (CATHETERS) ×8 IMPLANT
CLIP RETRACTION 3.0MM CORONARY (MISCELLANEOUS) ×4 IMPLANT
CLIP VESOCCLUDE MED 24/CT (CLIP) IMPLANT
CLIP VESOCCLUDE SM WIDE 24/CT (CLIP) IMPLANT
CONN ST 1/2X1/2  BEN (MISCELLANEOUS) ×4
CONN ST 1/2X1/2 BEN (MISCELLANEOUS) ×2 IMPLANT
CONNECTOR BLAKE 2:1 CARIO BLK (MISCELLANEOUS) ×4 IMPLANT
CONTAINER PROTECT SURGISLUSH (MISCELLANEOUS) ×4 IMPLANT
DERMABOND ADVANCED (GAUZE/BANDAGES/DRESSINGS) ×2
DERMABOND ADVANCED .7 DNX12 (GAUZE/BANDAGES/DRESSINGS) IMPLANT
DRAIN CHANNEL 19F RND (DRAIN) ×12 IMPLANT
DRAIN CONNECTOR BLAKE 1:1 (MISCELLANEOUS) ×4 IMPLANT
DRAPE CARDIOVASCULAR INCISE (DRAPES) ×4
DRAPE INCISE IOBAN 66X45 STRL (DRAPES) IMPLANT
DRAPE SRG 135X102X78XABS (DRAPES) ×2 IMPLANT
DRAPE WARM FLUID 44X44 (DRAPES) ×4 IMPLANT
DRSG AQUACEL AG ADV 3.5X10 (GAUZE/BANDAGES/DRESSINGS) ×4 IMPLANT
DRSG AQUACEL AG ADV 3.5X14 (GAUZE/BANDAGES/DRESSINGS) ×4 IMPLANT
DRSG COVADERM 4X14 (GAUZE/BANDAGES/DRESSINGS) ×4 IMPLANT
ELECT BLADE 4.0 EZ CLEAN MEGAD (MISCELLANEOUS) ×4
ELECT REM PT RETURN 9FT ADLT (ELECTROSURGICAL) ×8
ELECTRODE BLDE 4.0 EZ CLN MEGD (MISCELLANEOUS) ×2 IMPLANT
ELECTRODE REM PT RTRN 9FT ADLT (ELECTROSURGICAL) ×4 IMPLANT
FELT TEFLON 1X6 (MISCELLANEOUS) ×4 IMPLANT
GAUZE SPONGE 4X4 12PLY STRL (GAUZE/BANDAGES/DRESSINGS) ×8 IMPLANT
GAUZE SPONGE 4X4 12PLY STRL LF (GAUZE/BANDAGES/DRESSINGS) ×4 IMPLANT
GLOVE BIO SURGEON STRL SZ7 (GLOVE) ×8 IMPLANT
GLOVE BIOGEL M STRL SZ7.5 (GLOVE) ×8 IMPLANT
GLOVE SURG POLYISO LF SZ6 (GLOVE) ×2 IMPLANT
GOWN STRL REUS W/ TWL LRG LVL3 (GOWN DISPOSABLE) ×8 IMPLANT
GOWN STRL REUS W/ TWL XL LVL3 (GOWN DISPOSABLE) ×4 IMPLANT
GOWN STRL REUS W/TWL LRG LVL3 (GOWN DISPOSABLE) ×32
GOWN STRL REUS W/TWL XL LVL3 (GOWN DISPOSABLE) ×16
HEMOSTAT POWDER SURGIFOAM 1G (HEMOSTASIS) ×12 IMPLANT
INSERT FOGARTY XLG (MISCELLANEOUS) ×4 IMPLANT
INSERT SUTURE HOLDER (MISCELLANEOUS) ×4 IMPLANT
KIT BASIN OR (CUSTOM PROCEDURE TRAY) ×4 IMPLANT
KIT SUCTION CATH 14FR (SUCTIONS) ×4 IMPLANT
KIT TURNOVER KIT B (KITS) ×4 IMPLANT
KIT VASOVIEW HEMOPRO 2 VH 4000 (KITS) ×4 IMPLANT
LEAD PACING MYOCARDI (MISCELLANEOUS) ×4 IMPLANT
MARKER GRAFT CORONARY BYPASS (MISCELLANEOUS) ×6 IMPLANT
NS IRRIG 1000ML POUR BTL (IV SOLUTION) ×20 IMPLANT
PACK ACCESSORY CANNULA KIT (KITS) ×4 IMPLANT
PACK E OPEN HEART (SUTURE) ×4 IMPLANT
PACK OPEN HEART (CUSTOM PROCEDURE TRAY) ×4 IMPLANT
PAD ARMBOARD 7.5X6 YLW CONV (MISCELLANEOUS) ×8 IMPLANT
PAD ELECT DEFIB RADIOL ZOLL (MISCELLANEOUS) ×4 IMPLANT
PENCIL BUTTON HOLSTER BLD 10FT (ELECTRODE) ×4 IMPLANT
POSITIONER HEAD DONUT 9IN (MISCELLANEOUS) ×4 IMPLANT
PUNCH AORTIC ROTATE 4.0MM (MISCELLANEOUS) ×4 IMPLANT
SET MPS 3-ND DEL (MISCELLANEOUS) ×2 IMPLANT
SOL ANTI FOG 6CC (MISCELLANEOUS) ×2 IMPLANT
SOLUTION ANTI FOG 6CC (MISCELLANEOUS) ×2
SUPPORT HEART JANKE-BARRON (MISCELLANEOUS) ×4 IMPLANT
SUT BONE WAX W31G (SUTURE) ×4 IMPLANT
SUT ETHIBOND X763 2 0 SH 1 (SUTURE) ×10 IMPLANT
SUT MNCRL AB 3-0 PS2 18 (SUTURE) ×8 IMPLANT
SUT MNCRL AB 4-0 PS2 18 (SUTURE) ×3 IMPLANT
SUT PDS AB 1 CTX 36 (SUTURE) ×8 IMPLANT
SUT PROLENE 3 0 SH 48 (SUTURE) ×3 IMPLANT
SUT PROLENE 4 0 RB 1 (SUTURE) ×16
SUT PROLENE 4 0 SH DA (SUTURE) ×4 IMPLANT
SUT PROLENE 4-0 RB1 .5 CRCL 36 (SUTURE) IMPLANT
SUT PROLENE 5 0 C 1 36 (SUTURE) ×12 IMPLANT
SUT PROLENE 7 0 BV1 MDA (SUTURE) ×4 IMPLANT
SUT STEEL 6MS V (SUTURE) ×8 IMPLANT
SUT STEEL STERNAL CCS#1 18IN (SUTURE) ×12 IMPLANT
SUT VIC AB 2-0 CT1 27 (SUTURE) ×4
SUT VIC AB 2-0 CT1 TAPERPNT 27 (SUTURE) ×1 IMPLANT
SYSTEM SAHARA CHEST DRAIN ATS (WOUND CARE) ×4 IMPLANT
TAPE CLOTH SURG 4X10 WHT LF (GAUZE/BANDAGES/DRESSINGS) ×8 IMPLANT
TOWEL GREEN STERILE (TOWEL DISPOSABLE) ×4 IMPLANT
TOWEL GREEN STERILE FF (TOWEL DISPOSABLE) ×4 IMPLANT
TRAY FOLEY SLVR 16FR TEMP STAT (SET/KITS/TRAYS/PACK) ×4 IMPLANT
TUBING LAP HI FLOW INSUFFLATIO (TUBING) ×4 IMPLANT
UNDERPAD 30X36 HEAVY ABSORB (UNDERPADS AND DIAPERS) ×4 IMPLANT
WATER STERILE IRR 1000ML POUR (IV SOLUTION) ×8 IMPLANT

## 2020-07-08 NOTE — Anesthesia Procedure Notes (Signed)
Procedure Name: Intubation Date/Time: 07/08/2020 7:55 AM Performed by: Griffin Dakin, CRNA Pre-anesthesia Checklist: Patient identified, Emergency Drugs available, Suction available and Patient being monitored Patient Re-evaluated:Patient Re-evaluated prior to induction Oxygen Delivery Method: Circle system utilized Preoxygenation: Pre-oxygenation with 100% oxygen Induction Type: IV induction Ventilation: Mask ventilation without difficulty and Oral airway inserted - appropriate to patient size Laryngoscope Size: Mac and 4 Grade View: Grade II Tube type: Oral Tube size: 7.5 mm Number of attempts: 1 Airway Equipment and Method: Stylet and Oral airway Placement Confirmation: ETT inserted through vocal cords under direct vision,  positive ETCO2 and breath sounds checked- equal and bilateral Secured at: 23 cm Tube secured with: Tape Dental Injury: Teeth and Oropharynx as per pre-operative assessment

## 2020-07-08 NOTE — Anesthesia Procedure Notes (Signed)
Central Venous Catheter Insertion Performed by: Roberts Gaudy, MD, anesthesiologist Start/End6/03/2020 6:55 AM, 07/08/2020 7:05 AM Patient location: Pre-op. Preanesthetic checklist: patient identified, IV checked, site marked, risks and benefits discussed, surgical consent, monitors and equipment checked, pre-op evaluation, timeout performed and anesthesia consent Lidocaine 1% used for infiltration and patient sedated Hand hygiene performed  and maximum sterile barriers used  Catheter size: 9 Fr Sheath introducer Procedure performed using ultrasound guided technique. Ultrasound Notes:anatomy identified, needle tip was noted to be adjacent to the nerve/plexus identified, no ultrasound evidence of intravascular and/or intraneural injection and image(s) printed for medical record Attempts: 1 Following insertion, line sutured and dressing applied. Post procedure assessment: blood return through all ports, free fluid flow and no air  Patient tolerated the procedure well with no immediate complications.

## 2020-07-08 NOTE — Op Note (Signed)
[address]       [place] [address]             [phone]                                          07/08/2020 Patient:  Blake Sutton Pre-Op Dx: NSTEMI   2V CAD   HTN   Hyperlipidemia   Post-op Dx:  Same Procedure: CABG X 3, LIMA LAD, RSVG to LPDA, OM2   Endoscopic greater saphenous vein harvest on the right   Surgeon and Role:      * Darwyn Ponzo, Lucile Crater, MD - Primary    * T. Harriet Pho, PA-C - assisting  Anesthesia  general EBL:  557ml Blood Administration: non Xclamp Time:  59 min Pump Time:  19min  Drains: 70 F blake drain: R, L, mediastinal  Wires: ventricular Counts: correct   Indications: This is a 74 yo male that presents with an NSTEMI.  LHC revealed a nondominant right system, and 2V CAD.  Echo shows preserved RV and LV function.  He does have moderate MR, and the jet is mostly central.  He is tentatively scheduled for CABG 3 on 6/2.  The MR is likely ischemic, and will be watched for now.  Findings: Heavily calcified aorta.  We were able to find a soft spot proximal to the arch.  We performed all the proximals under single cross-clamp for this reason.  Large LIMA.  Good LAD target.  Good vein conduit.  Small left posterior descending artery.  Calcified circumflex vessel.  Mild to moderate MR at the completion of the case.  The jet was very central.  Operative Technique: All invasive lines were placed in pre-op holding.  After the risks, benefits and alternatives were thoroughly discussed, the patient was brought to the operative theatre.  Anesthesia was induced, and the patient was prepped and draped in normal sterile fashion.  An appropriate surgical pause was performed, and pre-operative antibiotics were dosed accordingly.  We began with simultaneous incisions along the right leg for harvesting of the greater saphenous vein and the chest for the sternotomy.  In regards to the sternotomy, this was carried down with bovie cautery, and the  sternum was divided with a reciprocating saw.  Meticulous hemostasis was obtained.  The left internal thoracic artery was exposed and harvested in in pedicled fashion.  The patient was systemically heparinized, and the artery was divided distally, and placed in a papaverine sponge.    The sternal elevator was removed, and a retractor was placed.  The pericardium was divided in the midline and fashioned into a cradle with pericardial stitches.   After we confirmed an appropriate ACT, the ascending aorta was cannulated in standard fashion.  The right atrial appendage was used for venous cannulation site.  Cardiopulmonary bypass was initiated, and the heart retractor was placed. The cross clamp was applied, and a dose of anterograde cardioplegia was given with good arrest of the heart.  We moved to the posterior wall of the heart, and found a good target on the PDA.  An arteriotomy was made, and the vein graft was anastomosed to it in an end to side fashion.  Next we exposed the lateral wall, and found a good target on the OM.  An end to side anastomosis with the vein graft was then created.  Finally, we exposed a good target on the LAD, and fashioned an end to side anastomosis between it and the LITA.   We created an end to side anastomosis between it and the proximal vein grafts.  The proximal sites were marked with rings.  We began to re-warm, and a re-animation dose of cardioplegia was given.  The heart was de-aired, and the cross clamp was removed.  Meticulous hemostasis was obtained.  We separated from cardiopulmonary bypass without event.the heparin was reversed with protamine.  Chest tubes and wires were placed, and the sternum was re-approximated with with sternal wires.  The soft tissue and skin were re-approximated wth absorbable suture.    The patient tolerated the procedure without any immediate complications, and was transferred to the ICU in guarded condition.  Blake Sutton

## 2020-07-08 NOTE — Anesthesia Postprocedure Evaluation (Signed)
Anesthesia Post Note  Patient: Leisure centre manager  Procedure(s) Performed: CORONARY ARTERY BYPASS GRAFTING (CABG) X  THREE ,ON PUMP, USING LEFT INTERNAL MAMMARY ARTERY AND RIGHT ENDOSPSCOPIC GREAT SAPHENOUS VEIN HARVEST CONDUITS (N/A Chest) TRANSESOPHAGEAL ECHOCARDIOGRAM (TEE) (N/A )     Patient location during evaluation: SICU Anesthesia Type: General Level of consciousness: sedated Pain management: pain level controlled Vital Signs Assessment: post-procedure vital signs reviewed and stable Respiratory status: patient remains intubated per anesthesia plan Cardiovascular status: stable Postop Assessment: no apparent nausea or vomiting Anesthetic complications: no   No complications documented.  Last Vitals:  Vitals:   07/08/20 1555 07/08/20 1600  BP:  93/81  Pulse: 80 80  Resp: 15 13  Temp: (!) 36.4 C (!) 36.4 C  SpO2: 100% 100%    Last Pain:  Vitals:   07/08/20 1555  TempSrc: Other (Comment)  PainSc:                  Catalina Gravel

## 2020-07-08 NOTE — Progress Notes (Signed)
     [address]       [place] [address]             [phone]       No events  Vitals:   07/07/20 2325 07/08/20 0336  BP: 110/66 96/64  Pulse: 64 71  Resp: 16 16  Temp: 98 F (36.7 C) 98 F (36.7 C)  SpO2: 94% 93%   Alert NAD Sinus EWOB  OR today for CABG  Briselda Naval O Margree Gimbel

## 2020-07-08 NOTE — Progress Notes (Signed)
  Echocardiogram Echocardiogram Transesophageal has been performed.  Blake Sutton 07/08/2020, 9:03 AM

## 2020-07-08 NOTE — Transfer of Care (Signed)
Immediate Anesthesia Transfer of Care Note  Patient: Blake Sutton  Procedure(s) Performed: CORONARY ARTERY BYPASS GRAFTING (CABG) X  THREE ,ON PUMP, USING LEFT INTERNAL MAMMARY ARTERY AND RIGHT ENDOSPSCOPIC GREAT SAPHENOUS VEIN HARVEST CONDUITS (N/A Chest) TRANSESOPHAGEAL ECHOCARDIOGRAM (TEE) (N/A )  Patient Location: ICU  Anesthesia Type:General  Level of Consciousness: Patient remains intubated per anesthesia plan  Airway & Oxygen Therapy: Patient remains intubated per anesthesia plan and Patient placed on Ventilator (see vital sign flow sheet for setting)  Post-op Assessment: Post -op Vital signs reviewed and stable  Post vital signs: Reviewed and stable  Last Vitals:  Vitals Value Taken Time  BP 86/63 07/08/20 1300  Temp 36.1 C 07/08/20 1306  Pulse 80 07/08/20 1306  Resp 16 07/08/20 1306  SpO2 100 % 07/08/20 1306  Vitals shown include unvalidated device data.  Last Pain:  Vitals:   07/08/20 0337  TempSrc:   PainSc: Asleep         Complications: No complications documented.

## 2020-07-08 NOTE — Progress Notes (Signed)
TCTS Evening Rounds  DOS s/p CABg Doing well, not bleeding Needing extra volume based on Flow Track readings  BP 93/81   Pulse 79   Temp 98.06 F (36.7 C)   Resp 14   Ht 5\' 10"  (1.778 m)   Wt 81.7 kg   SpO2 99%   BMI 25.84 kg/m  Remains intubated RRR (paced) CTA  Intake/Output Summary (Last 24 hours) at 07/08/2020 1655 Last data filed at 07/08/2020 1500 Gross per 24 hour  Intake 4579.37 ml  Output 3110 ml  Net 1469.37 ml   A/p: continue gentle resuscitation Otherwise routine care Silver Parkey Z. Orvan Seen, Ramona

## 2020-07-08 NOTE — Progress Notes (Signed)
Patient transported to pre-op.  Blake Sutton notified.  Belongings taken to [facility].

## 2020-07-08 NOTE — Brief Op Note (Signed)
07/03/2020 - 07/08/2020  2:38 PM  PATIENT:  Blake Sutton  75 y.o. male  PRE-OPERATIVE DIAGNOSIS:  CORONARY ARTERY DISEASE  POST-OPERATIVE DIAGNOSIS:  CORONARY ARTERY DISEASE  PROCEDURE:  Procedure(s): CORONARY ARTERY BYPASS GRAFTING (CABG) X  THREE ,ON PUMP, USING LEFT INTERNAL MAMMARY ARTERY AND RIGHT ENDOSPSCOPIC GREAT SAPHENOUS VEIN HARVEST CONDUITS (N/A) TRANSESOPHAGEAL ECHOCARDIOGRAM (TEE) (N/A)   SVG harvest time: 40 minutes, prep time 10 minutes  LIMA to LAD SVG to OM1 SVG to PDA  SURGEON:  Surgeon(s) and Role:    * Lightfoot, Lucile Crater, MD - Primary  PHYSICIAN ASSISTANT:  Nicholes Rough, PA-C   ANESTHESIA:   general  EBL:  see Anesthesia's note   BLOOD ADMINISTERED:none  DRAINS:  routine    LOCAL MEDICATIONS USED:  BUPIVICAINE   SPECIMEN:  No Specimen  DISPOSITION OF SPECIMEN:  N/A  COUNTS:  YES  DICTATION: .Dragon Dictation  PLAN OF CARE: Admit to inpatient   PATIENT DISPOSITION:  ICU - intubated and hemodynamically stable.   Delay start of Pharmacological VTE agent (>24hrs) due to surgical blood loss or risk of bleeding: yes

## 2020-07-08 NOTE — Anesthesia Procedure Notes (Signed)
Central Venous Catheter Insertion Performed by: Roberts Gaudy, MD, anesthesiologist Start/End6/03/2020 6:55 AM, 07/08/2020 7:05 AM Patient location: Pre-op. Preanesthetic checklist: patient identified, IV checked, site marked, risks and benefits discussed, surgical consent, monitors and equipment checked and timeout performed Position: Trendelenburg Catheter size: 8 Fr Central line was placed.Triple lumen Procedure performed without using ultrasound guided technique. Attempts: 1 Following insertion, Biopatch and dressing applied. Post procedure assessment: blood return through all ports, free fluid flow and no air

## 2020-07-08 NOTE — Procedures (Signed)
Extubation Procedure Note  Patient Details:   Name: Blake Sutton DOB: 1945-05-07 MRN: [id number]   Airway Documentation:    Vent end date: 07/08/20 Vent end time: 1835   Evaluation  O2 sats: stable throughout Complications: No apparent complications Patient did tolerate procedure well. Bilateral Breath Sounds: Clear,Diminished   Yes   Prior to extubation, pt's NIF= -35 VC =.1L. Pt also had a positive cuff leak. Pt was extubated to [facility]. Pt tolerated well with SVS. No stridor noted. Pt was able to state his name and where we was. RT will continue to monitor pt.  [name] 07/08/2020, 6:44 PM

## 2020-07-08 NOTE — Anesthesia Procedure Notes (Signed)
Arterial Line Insertion Start/End6/03/2020 7:00 AM, 07/08/2020 7:10 AM Performed by: Catalina Gravel, MD, Griffin Dakin, CRNA, CRNA  Patient location: Pre-op. Preanesthetic checklist: patient identified, IV checked, site marked, risks and benefits discussed, surgical consent, monitors and equipment checked, pre-op evaluation, timeout performed and anesthesia consent Lidocaine 1% used for infiltration Left, radial was placed Catheter size: 20 G Hand hygiene performed  and maximum sterile barriers used   Attempts: 1 Procedure performed without using ultrasound guided technique. Following insertion, dressing applied and Biopatch. Post procedure assessment: normal and unchanged  Patient tolerated the procedure well with no immediate complications.

## 2020-07-08 NOTE — Progress Notes (Signed)
Patient off floor in CABG.  Will defer to CVTS further care.  TRH available as needed. Eulogio Bear DO

## 2020-07-09 ENCOUNTER — Inpatient Hospital Stay (HOSPITAL_COMMUNITY): Payer: Medicare Other

## 2020-07-09 ENCOUNTER — Encounter (HOSPITAL_COMMUNITY): Payer: Self-pay | Admitting: Thoracic Surgery (Cardiothoracic Vascular Surgery)

## 2020-07-09 DIAGNOSIS — I214 Non-ST elevation (NSTEMI) myocardial infarction: Secondary | ICD-10-CM | POA: Diagnosis not present

## 2020-07-09 LAB — POCT I-STAT 7, (LYTES, BLD GAS, ICA,H+H)
Acid-base deficit: 2 mmol/L (ref 0.0–2.0)
Bicarbonate: 23.8 mmol/L (ref 20.0–28.0)
Calcium, Ion: 1.21 mmol/L (ref 1.15–1.40)
HCT: 22 % — ABNORMAL LOW (ref 39.0–52.0)
Hemoglobin: 7.5 g/dL — ABNORMAL LOW (ref 13.0–17.0)
O2 Saturation: 99 %
Patient temperature: 37.2
Potassium: 4.5 mmol/L (ref 3.5–5.1)
Sodium: 139 mmol/L (ref 135–145)
TCO2: 25 mmol/L (ref 22–32)
pCO2 arterial: 45.8 mmHg (ref 32.0–48.0)
pH, Arterial: 7.324 — ABNORMAL LOW (ref 7.350–7.450)
pO2, Arterial: 126 mmHg — ABNORMAL HIGH (ref 83.0–108.0)

## 2020-07-09 LAB — CBC
HCT: 27 % — ABNORMAL LOW (ref 39.0–52.0)
HCT: 28 % — ABNORMAL LOW (ref 39.0–52.0)
Hemoglobin: 8.6 g/dL — ABNORMAL LOW (ref 13.0–17.0)
Hemoglobin: 8.7 g/dL — ABNORMAL LOW (ref 13.0–17.0)
MCH: 29.2 pg (ref 26.0–34.0)
MCH: 29 pg (ref 26.0–34.0)
MCHC: 31.9 g/dL (ref 30.0–36.0)
MCHC: 31.1 g/dL (ref 30.0–36.0)
MCV: 91.5 fL (ref 80.0–100.0)
MCV: 93.3 fL (ref 80.0–100.0)
Platelets: 181 10*3/uL (ref 150–400)
Platelets: 187 10*3/uL (ref 150–400)
RBC: 2.95 MIL/uL — ABNORMAL LOW (ref 4.22–5.81)
RBC: 3 MIL/uL — ABNORMAL LOW (ref 4.22–5.81)
RDW: 14.4 % (ref 11.5–15.5)
RDW: 14.6 % (ref 11.5–15.5)
WBC: 15.7 10*3/uL — ABNORMAL HIGH (ref 4.0–10.5)
WBC: 16.6 10*3/uL — ABNORMAL HIGH (ref 4.0–10.5)
nRBC: 0 % (ref 0.0–0.2)
nRBC: 0 % (ref 0.0–0.2)

## 2020-07-09 LAB — GLUCOSE, CAPILLARY
Glucose-Capillary: 124 mg/dL — ABNORMAL HIGH (ref 70–99)
Glucose-Capillary: 128 mg/dL — ABNORMAL HIGH (ref 70–99)
Glucose-Capillary: 129 mg/dL — ABNORMAL HIGH (ref 70–99)
Glucose-Capillary: 130 mg/dL — ABNORMAL HIGH (ref 70–99)
Glucose-Capillary: 116 mg/dL — ABNORMAL HIGH (ref 70–99)
Glucose-Capillary: 109 mg/dL — ABNORMAL HIGH (ref 70–99)
Glucose-Capillary: 112 mg/dL — ABNORMAL HIGH (ref 70–99)
Glucose-Capillary: 112 mg/dL — ABNORMAL HIGH (ref 70–99)
Glucose-Capillary: 123 mg/dL — ABNORMAL HIGH (ref 70–99)

## 2020-07-09 LAB — BASIC METABOLIC PANEL
Anion gap: 7 (ref 5–15)
Anion gap: 7 (ref 5–15)
BUN: 15 mg/dL (ref 8–23)
BUN: 15 mg/dL (ref 8–23)
CO2: 26 mmol/L (ref 22–32)
CO2: 27 mmol/L (ref 22–32)
Calcium: 8.3 mg/dL — ABNORMAL LOW (ref 8.9–10.3)
Calcium: 8.4 mg/dL — ABNORMAL LOW (ref 8.9–10.3)
Chloride: 103 mmol/L (ref 98–111)
Chloride: 100 mmol/L (ref 98–111)
Creatinine, Ser: 1.04 mg/dL (ref 0.61–1.24)
Creatinine, Ser: 0.98 mg/dL (ref 0.61–1.24)
GFR, Estimated: >60 mL/min (ref >60)
GFR, Estimated: >60 mL/min (ref >60)
Glucose, Bld: 123 mg/dL — ABNORMAL HIGH (ref 70–99)
Glucose, Bld: 147 mg/dL — ABNORMAL HIGH (ref 70–99)
Potassium: 4.9 mmol/L (ref 3.5–5.1)
Potassium: 4.4 mmol/L (ref 3.5–5.1)
Sodium: 136 mmol/L (ref 135–145)
Sodium: 134 mmol/L — ABNORMAL LOW (ref 135–145)

## 2020-07-09 LAB — MAGNESIUM
Magnesium: 2.8 mg/dL — ABNORMAL HIGH (ref 1.7–2.4)
Magnesium: 2.3 mg/dL (ref 1.7–2.4)

## 2020-07-09 LAB — CULTURE, BLOOD (SINGLE): Culture: NO GROWTH

## 2020-07-09 MED ORDER — ONDANSETRON HCL 4 MG/2ML IJ SOLN
4.0000 mg | Freq: Once | INTRAMUSCULAR | Status: AC
  Administered 2020-07-09: 4 mg via INTRAVENOUS

## 2020-07-09 MED ORDER — ENOXAPARIN SODIUM 40 MG/0.4ML IJ SOSY
40.0000 mg | PREFILLED_SYRINGE | Freq: Every day | INTRAMUSCULAR | Status: DC
  Administered 2020-07-09 – 2020-07-12 (×4): 40 mg via SUBCUTANEOUS
  Filled 2020-07-09 (×4): qty 0.4

## 2020-07-09 MED ORDER — INSULIN ASPART 100 UNIT/ML IJ SOLN
0.0000 [IU] | INTRAMUSCULAR | Status: DC
  Administered 2020-07-09 – 2020-07-10 (×2): 2 [IU] via SUBCUTANEOUS

## 2020-07-09 MED ORDER — INSULIN ASPART 100 UNIT/ML IJ SOLN: 0.0000 [IU] | INTRAMUSCULAR | Status: DC

## 2020-07-09 MED ORDER — ONDANSETRON HCL 4 MG/2ML IJ SOLN: 4.0000 mg | Freq: Four times a day (QID) | INTRAMUSCULAR | Status: DC

## 2020-07-09 MED ORDER — ONDANSETRON HCL 4 MG/2ML IJ SOLN
4.0000 mg | Freq: Four times a day (QID) | INTRAMUSCULAR | Status: DC | PRN
  Administered 2020-07-09: 4 mg via INTRAVENOUS
  Filled 2020-07-09: qty 2

## 2020-07-09 MED ORDER — PANTOPRAZOLE SODIUM 40 MG PO TBEC
40.0000 mg | DELAYED_RELEASE_TABLET | Freq: Every day | ORAL | Status: DC
  Administered 2020-07-09 – 2020-07-13 (×5): 40 mg via ORAL
  Filled 2020-07-09 (×5): qty 1

## 2020-07-09 MED ORDER — ONDANSETRON HCL 4 MG/2ML IJ SOLN
INTRAMUSCULAR | Status: AC
  Administered 2020-07-09: 4 mg via INTRAVENOUS
  Filled 2020-07-09: qty 2

## 2020-07-09 MED ORDER — SODIUM CHLORIDE 0.9 % IV SOLN
12.5000 mg | Freq: Four times a day (QID) | INTRAVENOUS | Status: DC | PRN
  Administered 2020-07-09: 12.5 mg via INTRAVENOUS
  Filled 2020-07-09: qty 0.5

## 2020-07-09 NOTE — Hospital Course (Addendum)
HPI:   Mr. Blake Sutton is a 75 year old male patient with a past medical history significant for hypertension, hyperlipidemia, COPD, and current everyday smoker who had an episode of chest pressure with shortness of breath and nausea on Saturday 5/28. This was his first episode of chest pain. EMS arrived and placed oxygen on him. In the ER he remained hemodynamically stable with an elevated D-dimer. He underwent a CT Angio of the chest which showed no evidence of PE. Ascending thoracic aortic aneurysm and Aortic atherosclerosis was noted. Due to the nature of his symptoms and his history of smoking he underwent a cardiac catheterization today which showed ostial to proximal LAD disease which is 60%, mid LAD stenosis of 90% and 50% respectively. There is a 99% stenosis of the left circumflex with a 75% stenosis of the first obtuse marginal branch. He also has hypokinesis of the basal inferolateral wall with overall preserved LV function of 55-60%. There is moderate mitral valve regurgitation with no other significant valvular disease. We are consulted for possible surgical intervention for his coronary artery disease.  Hospital Course:   Mr. Guerrini underwent a CABG x 3 with Dr. Kipp Brood. He tolerated the procedure well and was transferred to the surgical ICU in stable condition. He was extubated in a timely manner. POD 1 he was hemodynamically stable and in normal sinus rhythm. He does have a ascending aortic aneurysm that will need to be followed up out patient.  He was hypotensive and treated with Albumin.  His chest tubes and arterial lines were removed without difficulty.  He was maintaining NSR and his pacing wires were removed on 6/4.  His hypotension persisted.  He was not started on a BB due to this.  He was unable to be put on Midodrine due to prolonged Qtc.  He was felt stable for transfer to the progressive care unit on 07/11/2020.

## 2020-07-09 NOTE — Progress Notes (Signed)
      [address]       [place] [address]             [phone]                 1 Day Post-Op Procedure(s) (LRB): CORONARY ARTERY BYPASS GRAFTING (CABG) X  THREE ,ON PUMP, USING LEFT INTERNAL MAMMARY ARTERY AND RIGHT ENDOSPSCOPIC GREAT SAPHENOUS VEIN HARVEST CONDUITS (N/A) TRANSESOPHAGEAL ECHOCARDIOGRAM (TEE) (N/A)   Events: No events. Extubated overnight _______________________________________________________________ Vitals: BP 109/70   Pulse 77   Temp 98.6 F (37 C)   Resp 17   Ht 5\' 10"  (1.778 m)   Wt 81.7 kg   SpO2 100%   BMI 25.84 kg/m   - Neuro: Alert, NAD  - Cardiovascular: Sinus.  Patient is set back up to 50.  Drips: None.   CVP:  [4 mmHg-17 mmHg] 12 mmHg  - Pulm: Easy work of breathing  ABG    Component Value Date/Time   PHART 7.324 (L) 07/08/2020 1829   PCO2ART 45.8 07/08/2020 1829   PO2ART 126 (H) 07/08/2020 1829   HCO3 23.8 07/08/2020 1829   TCO2 25 07/08/2020 1829   ACIDBASEDEF 2.0 07/08/2020 1829   O2SAT 99.0 07/08/2020 1829    - Abd: Nondistended - Extremity: Warm  .Intake/Output      06/02 0701 06/03 0700 06/03 0701 06/04 0700   P.O.     I.V. (mL/kg) 4957.9 (60.7) 391.8 (4.8)   Blood 475    Other 0    NG/GT 0    IV Piggyback 2120.2    Total Intake(mL/kg) 7553 (92.4) 391.8 (4.8)   Urine (mL/kg/hr) 3035 (1.5) 200 (0.8)   Emesis/NG output 200    Chest Tube 370 30   Total Output 3605 230   Net +3948 +161.8           _______________________________________________________________ Labs: CBC Latest Ref Rng & Units 07/09/2020 07/08/2020 07/08/2020  WBC 4.0 - 10.5 K/uL 15.7(H) - 11.4(H)  Hemoglobin 13.0 - 17.0 g/dL 8.6(L) 7.5(L) 8.7(L)  Hematocrit 39.0 - 52.0 % 27.0(L) 22.0(L) 27.3(L)  Platelets 150 - 400 K/uL 181 - 164   CMP Latest Ref Rng & Units 07/09/2020 07/08/2020 07/08/2020  Glucose 70 - 99 mg/dL 123(H) - 132(H)  BUN 8 - 23 mg/dL 15 - 16  Creatinine 0.61 - 1.24 mg/dL 1.04 - 1.09  Sodium 135 - 145 mmol/L 136  139 136  Potassium 3.5 - 5.1 mmol/L 4.9 4.5 5.0  Chloride 98 - 111 mmol/L 103 - 104  CO2 22 - 32 mmol/L 26 - 25  Calcium 8.9 - 10.3 mg/dL 8.3(L) - 8.3(L)  Total Protein 6.5 - 8.1 g/dL - - -  Total Bilirubin 0.3 - 1.2 mg/dL - - -  Alkaline Phos 38 - 126 U/L - - -  AST 15 - 41 U/L - - -  ALT 0 - 44 U/L - - -    CXR: Pulmonary vascular congestion  _______________________________________________________________  Assessment and Plan: POD 1 s/p CABG x3  Neuro: Pain controlled CV: Some orthostasis.  Encourage more p.o. intake.  We will remove A-line.  We will keep pacers for now. Pulm: Continue pulmonary toilet Renal: stable  Good uop GI: advancing diet Heme: stable ID: [email or web address] Endo: SSI Dispo: continue ICU care   Blake Sutton 07/09/2020 10:08 AM

## 2020-07-09 NOTE — Progress Notes (Signed)
Progress Note  Patient Name: Blake Sutton Date of Encounter: 07/09/2020  [facility] Cardiologist: Candee Furbish, MD   Subjective   Nausea this am.   Inpatient Medications    Scheduled Meds: . acetaminophen  1,000 mg Oral Q6H   Or  . acetaminophen (TYLENOL) oral liquid 160 mg/5 mL  1,000 mg Per Tube Q6H  . aspirin EC  325 mg Oral Daily   Or  . aspirin  324 mg Per Tube Daily  . atorvastatin  80 mg Oral QHS  . bisacodyl  10 mg Oral Daily   Or  . bisacodyl  10 mg Rectal Daily  . buPROPion  100 mg Oral BID  . Chlorhexidine Gluconate Cloth  6 each Topical Daily  . docusate sodium  200 mg Oral Daily  . FLUoxetine  20 mg Oral Daily  . metoprolol tartrate  12.5 mg Oral BID   Or  . metoprolol tartrate  12.5 mg Per Tube BID  . [START ON 07/10/2020] pantoprazole  40 mg Oral Daily  . sodium chloride flush  3 mL Intravenous Q12H  . sodium chloride flush  3 mL Intravenous Q12H  . sodium chloride flush  3 mL Intravenous Q12H  . tamsulosin  0.4 mg Oral Daily  . traZODone  50 mg Oral QHS  . umeclidinium bromide  1 puff Inhalation Daily   Continuous Infusions: . sodium chloride    . sodium chloride    . sodium chloride    . sodium chloride    .  ceFAZolin (ANCEF) IV Stopped (07/09/20 0542)  . dexmedetomidine (PRECEDEX) IV infusion Stopped (07/08/20 1551)  . DOBUTamine    . electrolyte-A 100 mL/hr at 07/09/20 0600  . famotidine (PEPCID) IV 20 mg (07/08/20 1416)  . insulin 0.9 mL/hr at 07/09/20 0600  . lactated ringers    . lactated ringers 20 mL/hr at 07/09/20 0600  . lactated ringers 10 mL/hr at 07/09/20 0600  . niCARDipine Stopped (07/08/20 1451)  . nitroGLYCERIN Stopped (07/08/20 1854)  . norepinephrine (LEVOPHED) Adult infusion 2 mcg/min (07/09/20 0600)  . phenylephrine (NEO-SYNEPHRINE) Adult infusion     PRN Meds: sodium chloride, sodium chloride, acetaminophen, albuterol, dextrose, fentaNYL (SUBLIMAZE) injection, lactated ringers, LORazepam, metoprolol tartrate,  midazolam, nitroGLYCERIN, oxyCODONE, sodium chloride flush, sodium chloride flush, zolpidem   Vital Signs    Vitals:   07/09/20 0300 07/09/20 0400 07/09/20 0500 07/09/20 0600  BP: 100/70 125/80 110/72 102/63  Pulse: 84 83 79 80  Resp: 15 (!) 22 16 14   Temp: 99.32 F (37.4 C) 99.32 F (37.4 C) 99.32 F (37.4 C) 99.14 F (37.3 C)  TempSrc:      SpO2: 100% 98% 98% 98%  Weight:      Height:        Intake/Output Summary (Last 24 hours) at 07/09/2020 0805 Last data filed at 07/09/2020 0600 Gross per 24 hour  Intake 7553.01 ml  Output 3605 ml  Net 3948.01 ml   Last 3 Weights 07/08/2020 07/04/2020 07/03/2020  Weight (lbs) 180 lb 1.9 oz 186 lb 1.1 oz 185 lb  Weight (kg) 81.7 kg 84.4 kg 83.915 kg      Telemetry    Sinus- Personally Reviewed  ECG    Sinus, non-specific T wave abn- Personally Reviewed  Physical Exam   General: Well developed, well nourished, NAD  HEENT: OP clear SKIN: warm, dry.  Neuro: No focal deficits  Musculoskeletal: Muscle strength 5/5 all ext  Psychiatric: Mood and affect normal  Neck: No JVD  Lungs:Clear  bilaterally, no wheezes, rhonci, crackles Cardiovascular: Regular rate and rhythm.  Abdomen:Soft.  Extremities: No lower extremity edema.    Labs    High Sensitivity Troponin:   Recent Labs  Lab 07/03/20 2346 07/04/20 0304 07/04/20 0938 07/04/20 1332  TROPONINIHS 367* 1,224* 2,594* 2,193*      Chemistry Recent Labs  Lab 07/03/20 2346 07/04/20 0456 07/08/20 0307 07/08/20 0801 07/08/20 1153 07/08/20 1312 07/08/20 1820 07/08/20 1829 07/09/20 0343  NA 141   < > 138   < > 138   < > 136 139 136  K 3.1*   < > 4.1   < > 4.3   < > 5.0 4.5 4.9  CL 103   < > 103   < > 102  --  104  --  103  CO2 28   < > 29  --   --   --  25  --  26  GLUCOSE 114*   < > 108*   < > 124*  --  132*  --  123*  BUN 15   < > 19   < > 17  --  16  --  15  CREATININE 1.00   < > 1.18   < > 0.90  --  1.09  --  1.04  CALCIUM 8.8*   < > 8.5*  --   --   --  8.3*  --   8.3*  PROT 7.0  --   --   --   --   --   --   --   --   ALBUMIN 3.2*  --   --   --   --   --   --   --   --   AST 24  --   --   --   --   --   --   --   --   ALT 24  --   --   --   --   --   --   --   --   ALKPHOS 198*  --   --   --   --   --   --   --   --   BILITOT 0.5  --   --   --   --   --   --   --   --   GFRNONAA >60   < > >60  --   --   --  >60  --  >60  ANIONGAP 10   < > 6  --   --   --  7  --  7   < > = values in this interval not displayed.     Hematology Recent Labs  Lab 07/08/20 1533 07/08/20 1820 07/08/20 1829 07/09/20 0343  WBC 9.6 11.4*  --  15.7*  RBC 2.88* 2.96*  --  2.95*  HGB 8.4* 8.7* 7.5* 8.6*  HCT 26.6* 27.3* 22.0* 27.0*  MCV 92.4 92.2  --  91.5  MCH 29.2 29.4  --  29.2  MCHC 31.6 31.9  --  31.9  RDW 14.2 14.3  --  14.4  PLT 157 164  --  181    BNP Recent Labs  Lab 07/04/20 0456  BNP 1,155.9*     DDimer  Recent Labs  Lab 07/03/20 2346  DDIMER 2.49*     Radiology    DG Chest Port 1 View  Result Date: 07/09/2020 CLINICAL DATA:  Post CABG. EXAM: PORTABLE CHEST  1 VIEW COMPARISON:  07/08/2020 FINDINGS: Endotracheal tube and nasogastric tube have been removed. Chest drain is in a stable position. Right jugular dialysis catheter with the tip in the upper SVC region and stable. Persistent patchy densities in left lung with obscuration of the left hemidiaphragm. Hazy densities at the right lung base are suggestive for atelectasis. Heart size is stable with post CABG changes. Negative for a large pneumothorax. IMPRESSION: 1. Persistent patchy densities in left lung. Left basilar densities could represent a combination of atelectasis and pleural fluid. 2. Support apparatuses as described. Electronically Signed   By: Markus Daft M.D.   On: 07/09/2020 07:56   DG Chest Port 1 View  Result Date: 07/08/2020 CLINICAL DATA:  CABG. EXAM: PORTABLE CHEST 1 VIEW COMPARISON:  CT 07/04/2020.  Chest x-ray 07/03/2020. FINDINGS: NG tube noted with side hole at the  gastroesophageal junction, advancement of approximately 8 cm should be considered. Endotracheal tube, right IJ line, mediastinal drainage catheter appear to be in good anatomic position. Prior CABG. Cardiomegaly. Interim partial resolution of bilateral pulmonary infiltrates/edema. No pleural effusion or pneumothorax. Surgical clips upper abdomen. Electronic device noted over the left upper abdomen/lower chest. IMPRESSION: 1. NG tube noted with side hole at the gastroesophageal junction, advancement of approximately 8 cm should be considered. Remaining lines and tubes in good anatomic position. 2. Prior CABG. Cardiomegaly. Interim partial resolution of bilateral pulmonary infiltrates/edema. Electronically Signed   By: Marcello Moores  Register   On: 07/08/2020 13:56   ECHO INTRAOPERATIVE TEE  Result Date: 07/08/2020  *INTRAOPERATIVE TRANSESOPHAGEAL REPORT *  Patient Name:   Blake Sutton Date of Exam: 07/08/2020 Medical Rec #:  [id number]       Height:       70.0 in Accession #:    [id number]      Weight:       180.1 lb Date of Birth:  24-Jan-1946       BSA:          2.00 m Patient Age:    75 years        BP:           96/64 mmHg Patient Gender: M               HR:           71 bpm. Exam Location:  Anesthesiology Transesophogeal exam was perform intraoperatively during surgical procedure. Patient was closely monitored under general anesthesia during the entirety of examination. Indications:     Coronary Artery Disease Sonographer:     Bernadene Person RDCS Performing Phys: [id number] Lucile Crater LIGHTFOOT Diagnosing Phys: Hoy Morn MD Complications: No known complications during this procedure. POST-OP IMPRESSIONS - Left Ventricle: The left ventricle is unchanged from pre-bypass. - Right Ventricle: The right ventricle appears unchanged from pre-bypass. - Aorta: The aorta appears unchanged from pre-bypass. - Left Atrial Appendage: The left atrial appendage appears unchanged from pre-bypass. - Aortic Valve: The aortic valve  appears unchanged from pre-bypass. - Mitral Valve: There is mild-moderate regurgitation. - Pulmonic Valve: The pulmonic valve appears unchanged from pre-bypass. - Interatrial Septum: The interatrial septum appears unchanged from pre-bypass. - Pericardium: The pericardium appears unchanged from pre-bypass. - Comments: Post-bypass images reviewed with surgeon. PRE-OP FINDINGS  Left Ventricle: The left ventricle has normal systolic function, with an ejection fraction of 55-60%. The cavity size was normal. No evidence of left ventricular regional wall motion abnormalities. There is mild concentric left ventricular hypertrophy.  LV Wall Scoring: The mid inferolateral segment is  hypokinetic.  Right Ventricle: The right ventricle has normal systolic function. The cavity was normal. There is no increase in right ventricular wall thickness. Left Atrium: Left atrial size was normal in size. No left atrial/left atrial appendage thrombus was detected. Right Atrium: Right atrial size was normal in size. Interatrial Septum: No atrial level shunt detected by color flow Doppler. Pericardium: There is no evidence of pericardial effusion. Mitral Valve: The mitral valve is normal in structure. Mitral valve regurgitation is mild by color flow Doppler. Tricuspid Valve: The tricuspid valve was normal in structure. Tricuspid valve regurgitation was not visualized by color flow Doppler. Aortic Valve: The aortic valve is tricuspid Aortic valve regurgitation was not visualized by color flow Doppler. Pulmonic Valve: The pulmonic valve was normal in structure. Pulmonic valve regurgitation is not visualized by color flow Doppler. Aorta: The aortic root, ascending aorta and aortic arch are normal in size and structure. There is evidence of plaque in the descending aorta. Pulmonary Artery: The pulmonary artery is of normal size.  Hoy Morn MD Electronically signed by Hoy Morn MD Signature Date/Time: 07/08/2020/6:21:03 PM    Final    VAS US  DOPPLER PRE CABG  Result Date: 07/07/2020 PREOPERATIVE VASCULAR EVALUATION Patient Name:  Blake Sutton  Date of Exam:   07/07/2020 Medical Rec #: [id number]        Accession #:    [id number] Date of Birth: 1945-07-09        Patient Gender: M Patient Age:   75Y Exam Location:  [facility] Procedure:      VAS US DOPPLER PRE CABG Referring Phys: [id number] HARRELL O LIGHTFOOT --------------------------------------------------------------------------------  Indications:      Pre-CABG. Risk Factors:     Hypertension, current smoker, coronary artery disease. Other Factors:    COPD. Comparison Study: No prior. Performing Technologist: Oda Cogan RDMS, RVT  Examination Guidelines: A complete evaluation includes B-mode imaging, spectral Doppler, color Doppler, and power Doppler as needed of all accessible portions of each vessel. Bilateral testing is considered an integral part of a complete examination. Limited examinations for reoccurring indications may be performed as noted.  Right Carotid Findings: +----------+--------+--------+--------+------------+------------------+           PSV cm/sEDV cm/sStenosisDescribe    Comments           +----------+--------+--------+--------+------------+------------------+ CCA Prox  76      16                                             +----------+--------+--------+--------+------------+------------------+ CCA Distal93      21                          intimal thickening +----------+--------+--------+--------+------------+------------------+ ICA Prox  62      19      1-39%   heterogenous                   +----------+--------+--------+--------+------------+------------------+ ICA Distal81      29                                             +----------+--------+--------+--------+------------+------------------+ ECA       102     17                                              +----------+--------+--------+--------+------------+------------------+  Portions of this table do not appear on this page. +----------+--------+-------+----------------+------------+           PSV cm/sEDV cmsDescribe        Arm Pressure +----------+--------+-------+----------------+------------+ Subclavian99             Multiphasic, WNL             +----------+--------+-------+----------------+------------+ +---------+--------+--+--------+-+---------+ VertebralPSV cm/s32EDV cm/s9Antegrade +---------+--------+--+--------+-+---------+ Left Carotid Findings: +----------+--------+--------+--------+------------+------------------+           PSV cm/sEDV cm/sStenosisDescribe    Comments           +----------+--------+--------+--------+------------+------------------+ CCA Prox  114     24                                             +----------+--------+--------+--------+------------+------------------+ CCA Mid                                       intimal thickening +----------+--------+--------+--------+------------+------------------+ CCA Distal96      29                          intimal thickening +----------+--------+--------+--------+------------+------------------+ ICA Prox  55      18      1-39%   heterogenous                   +----------+--------+--------+--------+------------+------------------+ ICA Distal71      23                                             +----------+--------+--------+--------+------------+------------------+ ECA       75      15                                             +----------+--------+--------+--------+------------+------------------+ +----------+--------+--------+----------------+------------+ SubclavianPSV cm/sEDV cm/sDescribe        Arm Pressure +----------+--------+--------+----------------+------------+           220             Multiphasic, WNL              +----------+--------+--------+----------------+------------+ +---------+--------+--+--------+--+---------+ VertebralPSV cm/s37EDV cm/s15Antegrade +---------+--------+--+--------+--+---------+  ABI Findings: +--------+------------------+-----+---------+--------+ Right   Rt Pressure (mmHg)IndexWaveform Comment  +--------+------------------+-----+---------+--------+ [id number]                    triphasic         +--------+------------------+-----+---------+--------+ PTA     125               1.16 triphasic         +--------+------------------+-----+---------+--------+ DP      132               1.22 triphasic         +--------+------------------+-----+---------+--------+ +--------+------------------+-----+---------+-------+ Left    Lt Pressure (mmHg)IndexWaveform Comment +--------+------------------+-----+---------+-------+ [id number]                    triphasic        +--------+------------------+-----+---------+-------+ PTA     128  1.19 triphasic        +--------+------------------+-----+---------+-------+ DP      132               1.22 biphasic         +--------+------------------+-----+---------+-------+ +-------+---------------+----------------+ ABI/TBIToday's ABI/TBIPrevious ABI/TBI +-------+---------------+----------------+ Right  1.2                             +-------+---------------+----------------+ Left   1.2                             +-------+---------------+----------------+  Right Doppler Findings: +--------+--------+-----+---------+--------+ Site    PressureIndexDoppler  Comments +--------+--------+-----+---------+--------+ [id number]          triphasic         +--------+--------+-----+---------+--------+ Radial               triphasic         +--------+--------+-----+---------+--------+ Ulnar                triphasic         +--------+--------+-----+---------+--------+  Left Doppler Findings:  +--------+--------+-----+---------+--------+ Site    PressureIndexDoppler  Comments +--------+--------+-----+---------+--------+ [id number]          triphasic         +--------+--------+-----+---------+--------+ Radial               triphasic         +--------+--------+-----+---------+--------+ Ulnar                triphasic         +--------+--------+-----+---------+--------+  Summary: Right Carotid: Velocities in the right ICA are consistent with a 1-39% stenosis. Left Carotid: Velocities in the left ICA are consistent with a 1-39% stenosis. Right ABI: Resting right ankle-brachial index is within normal range. No evidence of significant right lower extremity arterial disease. Left ABI: Resting left ankle-brachial index is within normal range. No evidence of significant left lower extremity arterial disease. Right Upper Extremity: Doppler waveform obliterate with right radial compression. Doppler waveforms remain within normal limits with right ulnar compression. Left Upper Extremity: Doppler waveforms remain within normal limits with left radial compression. Doppler waveforms remain within normal limits with left ulnar compression.  Electronically signed by Harold Barban MD on 07/07/2020 at 10:16:01 PM.    Final     Cardiac Studies     Echo 07/04/2020: Normal LVEF of 55 to 60% with basal inferolateral HK.  Mild LVH.  Indeterminate diastolic pressures with elevated LAP.  Mildly elevated PAP with normal RAP.Marland Kitchen  Moderate MR.  Mild aortic sclerosis with no stenosis..   Patient Profile     75 y.o. male with history of hypertension, hyperlipidemia and COPD presenting with chest pain and found to have a NSTEMI. Cardiac cath 07/06/20 with severe double vessel CAD involving the LAD and Circumflex. 3V CABG on 07/08/20.   Assessment & Plan    1. CAD/NSTEMI: Cardiac cath 07/06/20 with severe LAD and circumflex disease. Dr. Irish Lack performed his cath and felt that the best option for  revascularization was CABG. He is now s/p 3V  CABG on 07/08/20 and doing well. Hemodynamically stable.   -Continue ASA, statin and beta blocker.   2. Ascending aortic aneurysm: Ascending aorta measured at 4.4 cm on PE protocol CT will need annual follow-up.    For questions or updates, please contact [place] Please consult [email or web address] for contact info under  Signed, Lauree Chandler, MD  07/09/2020, 8:05 AM

## 2020-07-09 NOTE — Discharge Summary (Addendum)
[address]       [place] [address]             [phone]      Physician Discharge Summary  Patient ID: SAMARTH OGLE MRN: [id number] DOB/AGE: September 03, 1945 75 y.o.  Admit date: 07/03/2020 Discharge date: 07/13/2020  Admission Diagnoses:  Patient Active Problem List   Diagnosis Date Noted  . Coronary artery calcification seen on computed tomography 07/05/2020  . Acute respiratory failure with hypoxia ([facility]) 07/05/2020  . Acute diastolic heart failure ([facility]) 07/05/2020  . NSTEMI (non-ST elevated myocardial infarction) ([facility]) 07/04/2020  . Essential hypertension 07/04/2020  . COPD (chronic obstructive pulmonary disease) ([facility]) 07/04/2020  . Hyperlipidemia LDL goal <70 07/04/2020  . Elevated d-dimer 07/04/2020  . Leukocytosis 07/04/2020  . Prolonged QT interval 07/04/2020  . CAP (community acquired pneumonia) 07/04/2020  . Altered mental status 07/07/2013  . Left-sided weakness 07/07/2013    Discharge Diagnoses:  Principal Problem:   NSTEMI (non-ST elevated myocardial infarction) ([facility]) Active Problems:   Essential hypertension   COPD (chronic obstructive pulmonary disease) (H[facility])   Hyperlipidemia LDL goal <70   Elevated d-dimer   Leukocytosis   CAP (community acquired pneumonia)   Coronary artery calcification seen on computed tomography   Acute respiratory failure with hypoxia ([facility])   Acute diastolic heart failure ([facility])   S/P CABG x 3   Discharged Condition: good  HPI:   Mr. Teegan Brandis is a 75 year old male patient with a past medical history significant for hypertension, hyperlipidemia, COPD, and current everyday smoker who had an episode of chest pressure with shortness of breath and nausea on Saturday 5/28. This was his first episode of chest pain. EMS arrived and placed oxygen on him. In the ER he remained hemodynamically stable with an elevated D-dimer. He underwent a CT Angio of the chest which showed no evidence of PE. Ascending  thoracic aortic aneurysm and Aortic atherosclerosis was noted. Due to the nature of his symptoms and his history of smoking he underwent a cardiac catheterization today which showed ostial to proximal LAD disease which is 60%, mid LAD stenosis of 90% and 50% respectively. There is a 99% stenosis of the left circumflex with a 75% stenosis of the first obtuse marginal branch. He also has hypokinesis of the basal inferolateral wall with overall preserved LV function of 55-60%. There is moderate mitral valve regurgitation with no other significant valvular disease. We are consulted for possible surgical intervention for his coronary artery disease.  Hospital Course:   Mr. Goyer underwent a CABG x 3 with Dr. Kipp Brood. He tolerated the procedure well and was transferred to the surgical ICU in stable condition. He was extubated in a timely manner. POD 1 he was hemodynamically stable and in normal sinus rhythm. He does have a ascending aortic aneurysm that will need to be followed up out patient. He was hypotensive and treated with Albumin.  His chest tubes and arterial lines were removed without difficulty.  He was maintaining NSR and his pacing wires were removed on 6/4.  His hypotension persisted.  He was not started on a BB due to this.  He was unable to be put on Midodrine due to prolonged Qtc.  He was felt stable for transfer to the progressive care unit on 07/11/2020.  Following transfer he has continued to make excellent progress.  Incisions are noted to be healing well without evidence of infection.  He is tolerating diet and having  bowel movements.  Oxygen has been keeping saturations on room air.  He is maintaining sinus rhythm with rare ectopy.  He is ambulating well using standard post cardiac surgical protocols.  He does have an expected acute blood loss anemia.  Most recent hemoglobin hematocrit dated 07/12/2020 are 8.0 and 25.2 respectively.  Renal function has remained in the normal range and he is  making good urine output.  He does not appear to be clinically volume overloaded. Plavix and protonix will added per cardiology recs ,stopping omeprazole. At the time of discharge the patient was felt to be quite stable.    Consults: cardiology  Significant Diagnostic Studies:  Left Anterior Descending  Ost LAD to Prox LAD lesion is 60% stenosed. The lesion is calcified.  Mid LAD-1 lesion is 90% stenosed.  Mid LAD-2 lesion is 50% stenosed.  Left Circumflex  Mid Cx lesion is 99% stenosed. Vessel is the culprit lesion. The lesion is heavily thrombotic.  First Obtuse Marginal Branch  1st Mrg lesion is 75% stenosed.   Intervention   No interventions have been documented.  Left Heart  Left Ventricle LV end diastolic pressure is normal.  Aortic Valve There is no aortic valve stenosis.   Coronary Diagrams   Diagnostic Dominance: Left    Intervention     Treatments: surgery:   07/08/2020 Patient:  Jennefer Bravo Scifres Pre-Op Dx: NSTEMI                         2V CAD                         HTN                         Hyperlipidemia   Post-op Dx:  Same Procedure: CABG X 3, LIMA LAD, RSVG to LPDA, OM2   Endoscopic greater saphenous vein harvest on the right   Surgeon and Role:      * Lightfoot, Lucile Crater, MD - Primary    * T. Harriet Pho, PA-C - assisting  Anesthesia  general EBL:  567ml Blood Administration: non Xclamp Time:  59 min Pump Time:  44min Discharge Exam: Blood pressure 136/85, pulse 83, temperature 97.8 F (36.6 C), temperature source Oral, resp. rate 20, height 5\' 11"  (1.803 m), weight 84.8 kg, SpO2 96 %.    General appearance: alert, cooperative and no distress Heart: regular rate and rhythm Lungs: clear to auscultation bilaterally Abdomen: benign Extremities: no edema Wound: incis healing well  Disposition: Discharge disposition: 01-Home or Self Care       Discharge Instructions    Amb Referral to Cardiac Rehabilitation   Complete by: As  directed    Diagnosis:  CABG NSTEMI     CABG X ___: 3   After initial evaluation and assessments completed: Virtual Based Care may be provided alone or in conjunction with Phase 2 Cardiac Rehab based on patient barriers.: Yes   Discharge patient   Complete by: As directed    Discharge disposition: 01-Home or Self Care   Discharge patient date: 07/13/2020     Allergies as of 07/13/2020      Reactions   Oxycodone Nausea And Vomiting   Pravastatin Itching   Other reaction(s): Xerostomia, Itching      Medication List    STOP taking these medications   amLODipine 5 MG tablet Commonly known as: NORVASC   atenolol 25 MG  tablet Commonly known as: TENORMIN   celecoxib 200 MG capsule Commonly known as: CELEBREX   hydrochlorothiazide 25 MG tablet Commonly known as: HYDRODIURIL   meloxicam 15 MG tablet Commonly known as: MOBIC   omeprazole 20 MG capsule Commonly known as: PRILOSEC   sildenafil 100 MG tablet Commonly known as: VIAGRA   simvastatin 40 MG tablet Commonly known as: ZOCOR     TAKE these medications   albuterol 108 (90 Base) MCG/ACT inhaler Commonly known as: VENTOLIN HFA Inhale 1-2 puffs into the lungs every 6 (six) hours as needed for wheezing or shortness of breath.   aspirin 81 MG EC tablet Take 1 tablet (81 mg total) by mouth daily.   atorvastatin 80 MG tablet Commonly known as: LIPITOR Take 1 tablet (80 mg total) by mouth at bedtime.   buPROPion 100 MG 12 hr tablet Commonly known as: WELLBUTRIN SR Take 100 mg by mouth 2 (two) times daily.   clopidogrel 75 MG tablet Commonly known as: Plavix Take 1 tablet (75 mg total) by mouth daily.   FLUoxetine 20 MG capsule Commonly known as: PROZAC Take 20 mg by mouth daily.   HYDROcodone-acetaminophen 5-325 MG tablet Commonly known as: NORCO/VICODIN Take 1 tablet by mouth every 6 (six) hours as needed for up to 7 days (pain).   metoprolol tartrate 25 MG tablet Commonly known as: LOPRESSOR Take 0.5  tablets (12.5 mg total) by mouth 2 (two) times daily.   pantoprazole 40 MG tablet Commonly known as: PROTONIX Take 1 tablet (40 mg total) by mouth daily. Start taking on: July 14, 2020   tamsulosin 0.4 MG Caps capsule Commonly known as: FLOMAX Take 0.4 mg by mouth daily.   traZODone 50 MG tablet Commonly known as: DESYREL Take 50 mg by mouth at bedtime.       Follow-up Information    [name] T, PA-C Follow up.   Specialties: Cardiology, Physician Assistant Why: [place] location - follow-up on Tuesday August 10, 2020 at 8:45 AM (Arrive by 8:30 AM). Nicki Reaper is one of our PAs with our cardiology team. Contact information: [address] [place] [address] [phone]              The patient has been discharged on:   1.Beta Blocker:  Yes [ yes  ]                              No   [   ]                              If No, reason:  2.Ace Inhibitor/ARB: Yes [   ]                                     No  [  n  ]                                     If No, reason:BP low at times,too  labile to start at this time  3.Statin:   Yes [ yes ]                  No  [   ]  If No, reason:  4.Ecasa:  Yes  [  yes ]                  No   [   ]                  If No, reason:   Signed: [name] PA-C 07/13/2020, 9:35 AM

## 2020-07-09 NOTE — Discharge Instructions (Signed)

## 2020-07-10 ENCOUNTER — Inpatient Hospital Stay (HOSPITAL_COMMUNITY): Payer: Medicare Other

## 2020-07-10 DIAGNOSIS — E785 Hyperlipidemia, unspecified: Secondary | ICD-10-CM | POA: Diagnosis not present

## 2020-07-10 DIAGNOSIS — I10 Essential (primary) hypertension: Secondary | ICD-10-CM | POA: Diagnosis not present

## 2020-07-10 DIAGNOSIS — I25110 Atherosclerotic heart disease of native coronary artery with unstable angina pectoris: Secondary | ICD-10-CM

## 2020-07-10 DIAGNOSIS — I214 Non-ST elevation (NSTEMI) myocardial infarction: Secondary | ICD-10-CM | POA: Diagnosis not present

## 2020-07-10 DIAGNOSIS — I5031 Acute diastolic (congestive) heart failure: Secondary | ICD-10-CM | POA: Diagnosis not present

## 2020-07-10 LAB — CBC
HCT: 26.9 % — ABNORMAL LOW (ref 39.0–52.0)
Hemoglobin: 8.6 g/dL — ABNORMAL LOW (ref 13.0–17.0)
MCH: 29.8 pg (ref 26.0–34.0)
MCHC: 32 g/dL (ref 30.0–36.0)
MCV: 93.1 fL (ref 80.0–100.0)
Platelets: 171 10*3/uL (ref 150–400)
RBC: 2.89 MIL/uL — ABNORMAL LOW (ref 4.22–5.81)
RDW: 14.6 % (ref 11.5–15.5)
WBC: 12.2 10*3/uL — ABNORMAL HIGH (ref 4.0–10.5)
nRBC: 0 % (ref 0.0–0.2)

## 2020-07-10 LAB — BASIC METABOLIC PANEL
Anion gap: 4 — ABNORMAL LOW (ref 5–15)
BUN: 13 mg/dL (ref 8–23)
CO2: 31 mmol/L (ref 22–32)
Calcium: 8.5 mg/dL — ABNORMAL LOW (ref 8.9–10.3)
Chloride: 102 mmol/L (ref 98–111)
Creatinine, Ser: 0.99 mg/dL (ref 0.61–1.24)
GFR, Estimated: >60 mL/min (ref >60)
Glucose, Bld: 125 mg/dL — ABNORMAL HIGH (ref 70–99)
Potassium: 4.1 mmol/L (ref 3.5–5.1)
Sodium: 137 mmol/L (ref 135–145)

## 2020-07-10 LAB — GLUCOSE, CAPILLARY
Glucose-Capillary: 112 mg/dL — ABNORMAL HIGH (ref 70–99)
Glucose-Capillary: 117 mg/dL — ABNORMAL HIGH (ref 70–99)
Glucose-Capillary: 129 mg/dL — ABNORMAL HIGH (ref 70–99)
Glucose-Capillary: 137 mg/dL — ABNORMAL HIGH (ref 70–99)
Glucose-Capillary: 121 mg/dL — ABNORMAL HIGH (ref 70–99)
Glucose-Capillary: 136 mg/dL — ABNORMAL HIGH (ref 70–99)
Glucose-Capillary: 214 mg/dL — ABNORMAL HIGH (ref 70–99)

## 2020-07-10 MED ORDER — ALBUMIN HUMAN 25 % IV SOLN
12.5000 g | Freq: Once | INTRAVENOUS | Status: AC
  Administered 2020-07-10: 12.5 g via INTRAVENOUS
  Filled 2020-07-10: qty 50

## 2020-07-10 MED ORDER — INSULIN ASPART 100 UNIT/ML IJ SOLN
0.0000 [IU] | Freq: Every day | INTRAMUSCULAR | Status: DC
  Administered 2020-07-10: 2 [IU] via SUBCUTANEOUS

## 2020-07-10 MED ORDER — ALBUMIN HUMAN 5 % IV SOLN
INTRAVENOUS | Status: AC
  Filled 2020-07-10: qty 250

## 2020-07-10 MED ORDER — INSULIN ASPART 100 UNIT/ML IJ SOLN
0.0000 [IU] | Freq: Three times a day (TID) | INTRAMUSCULAR | Status: DC
  Administered 2020-07-10 – 2020-07-12 (×3): 2 [IU] via SUBCUTANEOUS

## 2020-07-10 MED ORDER — ALBUMIN HUMAN 5 % IV SOLN
12.5000 g | Freq: Once | INTRAVENOUS | Status: AC
  Administered 2020-07-10: 12.5 g via INTRAVENOUS

## 2020-07-10 NOTE — Progress Notes (Signed)
Progress Note  Patient Name: Blake Sutton Date of Encounter: 07/10/2020  [facility] Cardiologist: Candee Furbish, MD   Subjective   Feels well. Denies pain, dyspnea, nausea improved.  Inpatient Medications    Scheduled Meds: . acetaminophen  1,000 mg Oral Q6H   Or  . acetaminophen (TYLENOL) oral liquid 160 mg/5 mL  1,000 mg Per Tube Q6H  . aspirin EC  325 mg Oral Daily   Or  . aspirin  324 mg Per Tube Daily  . atorvastatin  80 mg Oral QHS  . bisacodyl  10 mg Oral Daily   Or  . bisacodyl  10 mg Rectal Daily  . buPROPion  100 mg Oral BID  . Chlorhexidine Gluconate Cloth  6 each Topical Daily  . docusate sodium  200 mg Oral Daily  . enoxaparin (LOVENOX) injection  40 mg Subcutaneous QHS  . FLUoxetine  20 mg Oral Daily  . insulin aspart  0-24 Units Subcutaneous Q4H  . metoprolol tartrate  12.5 mg Oral BID   Or  . metoprolol tartrate  12.5 mg Per Tube BID  . pantoprazole  40 mg Oral Daily  . sodium chloride flush  3 mL Intravenous Q12H  . sodium chloride flush  3 mL Intravenous Q12H  . sodium chloride flush  3 mL Intravenous Q12H  . tamsulosin  0.4 mg Oral Daily  . traZODone  50 mg Oral QHS  . umeclidinium bromide  1 puff Inhalation Daily   Continuous Infusions: . sodium chloride    . sodium chloride    . sodium chloride    . sodium chloride    . dexmedetomidine (PRECEDEX) IV infusion Stopped (07/08/20 1551)  . electrolyte-A Stopped (07/09/20 4193)  . insulin Stopped (07/09/20 1044)  . lactated ringers    . lactated ringers Stopped (07/09/20 1044)  . lactated ringers Stopped (07/09/20 0738)  . promethazine (PHENERGAN) injection (IM or IVPB) Stopped (07/09/20 1617)   PRN Meds: sodium chloride, sodium chloride, acetaminophen, albuterol, dextrose, fentaNYL (SUBLIMAZE) injection, lactated ringers, LORazepam, metoprolol tartrate, midazolam, ondansetron (ZOFRAN) IV, oxyCODONE, promethazine (PHENERGAN) injection (IM or IVPB), sodium chloride flush, sodium chloride  flush, zolpidem   Vital Signs    Vitals:   07/10/20 0700 07/10/20 0730 07/10/20 0800 07/10/20 0802  BP: (!) 86/60 (!) 81/53  (!) 90/56  Pulse: 76 71 73   Resp: 11 18 11    Temp:    98.2 F (36.8 C)  TempSrc:      SpO2: 96% 95% 95%   Weight:      Height:        Intake/Output Summary (Last 24 hours) at 07/10/2020 0854 Last data filed at 07/10/2020 0800 Gross per 24 hour  Intake 953.17 ml  Output 1540 ml  Net -586.83 ml   Last 3 Weights 07/10/2020 07/08/2020 07/04/2020  Weight (lbs) 188 lb 0.8 oz 180 lb 1.9 oz 186 lb 1.1 oz  Weight (kg) 85.3 kg 81.7 kg 84.4 kg      Telemetry    NSR - Personally Reviewed  ECG    No new tracing - Personally Reviewed  Physical Exam  Appears well GEN: No acute distress.   Neck: No JVD Cardiac: RRR, no murmurs, rubs, or gallops.  Respiratory: Clear to auscultation bilaterally. GI: Soft, nontender, non-distended  MS: No edema; No deformity. Neuro:  Nonfocal  Psych: Normal affect   Labs    High Sensitivity Troponin:   Recent Labs  Lab 07/03/20 2346 07/04/20 0304 07/04/20 0938 07/04/20 1332  TROPONINIHS  367* [phone]* 2,193*      Chemistry Recent Labs  Lab 07/03/20 2346 07/04/20 0456 07/09/20 0343 07/09/20 1737 07/10/20 0544  NA 141   < > 136 134* 137  K 3.1*   < > 4.9 4.4 4.1  CL 103   < > 103 100 102  CO2 28   < > 26 27 31   GLUCOSE 114*   < > 123* 147* 125*  BUN 15   < > 15 15 13   CREATININE 1.00   < > 1.04 0.98 0.99  CALCIUM 8.8*   < > 8.3* 8.4* 8.5*  PROT 7.0  --   --   --   --   ALBUMIN 3.2*  --   --   --   --   AST 24  --   --   --   --   ALT 24  --   --   --   --   ALKPHOS 198*  --   --   --   --   BILITOT 0.5  --   --   --   --   GFRNONAA >60   < > >60 >60 >60  ANIONGAP 10   < > 7 7 4*   < > = values in this interval not displayed.     Hematology Recent Labs  Lab 07/09/20 0343 07/09/20 1737 07/10/20 0544  WBC 15.7* 16.6* 12.2*  RBC 2.95* 3.00* 2.89*  HGB 8.6* 8.7* 8.6*  HCT 27.0* 28.0* 26.9*  MCV  91.5 93.3 93.1  MCH 29.2 29.0 29.8  MCHC 31.9 31.1 32.0  RDW 14.4 14.6 14.6  PLT 181 187 171    BNP Recent Labs  Lab 07/04/20 0456  BNP 1,155.9*     DDimer  Recent Labs  Lab 07/03/20 2346  DDIMER 2.49*     Radiology    DG Chest Port 1 View  Result Date: 07/10/2020 CLINICAL DATA:  Status post coronary artery bypass grafting EXAM: PORTABLE CHEST 1 VIEW COMPARISON:  July 09, 2020 FINDINGS: Central catheter tip is in the superior vena cava. Mediastinal drain again noted. No pneumothorax. There is a small left pleural effusion with patchy atelectasis in the lung bases. There may be superimposed infiltrate in the left base. Heart is mildly enlarged with pulmonary vascularity are normal. Patient is status post coronary artery bypass grafting. There is aortic atherosclerosis. No adenopathy. There is degenerative change in each shoulder. IMPRESSION: Tube and catheter positions as described without evident pneumothorax. Small left pleural effusion with bibasilar atelectasis. A degree of superimposed infiltrate in the left base cannot be excluded. Stable cardiac prominence with postoperative changes. Aortic Atherosclerosis (ICD10-I70.0). Electronically Signed   By: Lowella Grip III M.D.   On: 07/10/2020 08:24   DG Chest Port 1 View  Result Date: 07/09/2020 CLINICAL DATA:  Post CABG. EXAM: PORTABLE CHEST 1 VIEW COMPARISON:  07/08/2020 FINDINGS: Endotracheal tube and nasogastric tube have been removed. Chest drain is in a stable position. Right jugular dialysis catheter with the tip in the upper SVC region and stable. Persistent patchy densities in left lung with obscuration of the left hemidiaphragm. Hazy densities at the right lung base are suggestive for atelectasis. Heart size is stable with post CABG changes. Negative for a large pneumothorax. IMPRESSION: 1. Persistent patchy densities in left lung. Left basilar densities could represent a combination of atelectasis and pleural fluid. 2.  Support apparatuses as described. Electronically Signed   By: Markus Daft M.D.   On:  07/09/2020 07:56   DG Chest Port 1 View  Result Date: 07/08/2020 CLINICAL DATA:  CABG. EXAM: PORTABLE CHEST 1 VIEW COMPARISON:  CT 07/04/2020.  Chest x-ray 07/03/2020. FINDINGS: NG tube noted with side hole at the gastroesophageal junction, advancement of approximately 8 cm should be considered. Endotracheal tube, right IJ line, mediastinal drainage catheter appear to be in good anatomic position. Prior CABG. Cardiomegaly. Interim partial resolution of bilateral pulmonary infiltrates/edema. No pleural effusion or pneumothorax. Surgical clips upper abdomen. Electronic device noted over the left upper abdomen/lower chest. IMPRESSION: 1. NG tube noted with side hole at the gastroesophageal junction, advancement of approximately 8 cm should be considered. Remaining lines and tubes in good anatomic position. 2. Prior CABG. Cardiomegaly. Interim partial resolution of bilateral pulmonary infiltrates/edema. Electronically Signed   By: Marcello Moores  Register   On: 07/08/2020 13:56    Cardiac Studies    Echo 07/04/2020: Normal LVEF of 55 to 60% with basal inferolateral HK. Mild LVH. Indeterminate diastolic pressures with elevated LAP. Mildly elevated PAP with normal RAP.Marland Kitchen Moderate MR. Mild aortic sclerosis with no stenosis.Marland Kitchen  CATH 07/06/2020  1st Mrg lesion is 75% stenosed.  Mid Cx lesion is 99% stenosed.  Ost LAD to Prox LAD lesion is 60% stenosed.  Mid LAD lesion is 90% stenosed. Ectatic area in the mid LAD at the end of the stenosis.  Mid LAD-2 lesion is 50% stenosed.  LV end diastolic pressure is normal.  There is no aortic valve stenosis.   Left dominant circulation.   Calcific OM1 and LAD disease.  Moderate ostial LAD disease.  Culprit was lesion in the circumflex that was heavily thrombotic with high risk of distal embolization if PCI attempted.  Continue anticoagulation.  Cardiac surgery consult.  Consider GP  IIb/IIIa inhibitor depending on timing of the surgery. Diagnostic Dominance: Left        Patient Profile     75 y.o. male with history of hypertension, hyperlipidemia and COPD presenting withchest pain and found to have a NSTEMI.Cardiac cath 07/06/20 with severe double vessel CAD involving the LAD and Circumflex. 3V CABG on 07/08/20.   Assessment & Plan    Doing well day#2 post CABG. No CHF, no arrhythmia. Ideally, DC w clopidogrel for 12 months since he presented with ACS. On statin, ASA and metoprolol. Will need yearly CTA monitoring for Asc Ao Aneurysm.      For questions or updates, please contact [place] Please consult [email or web address] for contact info under        Signed, Sanda Klein, MD  07/10/2020, 8:54 AM

## 2020-07-10 NOTE — Plan of Care (Signed)

## 2020-07-10 NOTE — Progress Notes (Signed)
      [address]       [place] [address]             [phone]                 2 Days Post-Op Procedure(s) (LRB): CORONARY ARTERY BYPASS GRAFTING (CABG) X  THREE ,ON PUMP, USING LEFT INTERNAL MAMMARY ARTERY AND RIGHT ENDOSPSCOPIC GREAT SAPHENOUS VEIN HARVEST CONDUITS (N/A) TRANSESOPHAGEAL ECHOCARDIOGRAM (TEE) (N/A)   Events: No events.  _______________________________________________________________ Vitals: BP (!) 90/56   Pulse 73   Temp 98.2 F (36.8 C)   Resp 11   Ht 5\' 10"  (1.778 m)   Wt 85.3 kg   SpO2 95%   BMI 26.98 kg/m   - Neuro: Alert, NAD  - Cardiovascular: Sinus.    Drips: None.      - Pulm: Easy work of breathing  ABG    Component Value Date/Time   PHART 7.324 (L) 07/08/2020 1829   PCO2ART 45.8 07/08/2020 1829   PO2ART 126 (H) 07/08/2020 1829   HCO3 23.8 07/08/2020 1829   TCO2 25 07/08/2020 1829   ACIDBASEDEF 2.0 07/08/2020 1829   O2SAT 99.0 07/08/2020 1829    - Abd: Nondistended - Extremity: Warm  .Intake/Output      06/03 0701 06/04 0700 06/04 0701 06/05 0700   P.O. 400 240   I.V. (mL/kg) 434.6 (5.1)    Blood     Other     NG/GT     IV Piggyback 150    Total Intake(mL/kg) 984.6 (11.5) 240 (2.8)   Urine (mL/kg/hr) 1480 (0.7)    Emesis/NG output     Chest Tube 270 20   Total Output 1750 20   Net -765.4 +220        Urine Occurrence 1 x       _______________________________________________________________ Labs: CBC Latest Ref Rng & Units 07/10/2020 07/09/2020 07/09/2020  WBC 4.0 - 10.5 K/uL 12.2(H) 16.6(H) 15.7(H)  Hemoglobin 13.0 - 17.0 g/dL 8.6(L) 8.7(L) 8.6(L)  Hematocrit 39.0 - 52.0 % 26.9(L) 28.0(L) 27.0(L)  Platelets 150 - 400 K/uL 171 187 181   CMP Latest Ref Rng & Units 07/10/2020 07/09/2020 07/09/2020  Glucose 70 - 99 mg/dL 125(H) 147(H) 123(H)  BUN 8 - 23 mg/dL 13 15 15   Creatinine 0.61 - 1.24 mg/dL 0.99 0.98 1.04  Sodium 135 - 145 mmol/L 137 134(L) 136  Potassium 3.5 - 5.1 mmol/L 4.1 4.4 4.9  Chloride  98 - 111 mmol/L 102 100 103  CO2 22 - 32 mmol/L 31 27 26   Calcium 8.9 - 10.3 mg/dL 8.5(L) 8.4(L) 8.3(L)  Total Protein 6.5 - 8.1 g/dL - - -  Total Bilirubin 0.3 - 1.2 mg/dL - - -  Alkaline Phos 38 - 126 U/L - - -  AST 15 - 41 U/L - - -  ALT 0 - 44 U/L - - -    CXR: Pulmonary vascular congestion  _______________________________________________________________  Assessment and Plan: POD 2 s/p CABG x3  Neuro: Pain controlled CV: soft blood pressures.  Will remove wires Pulm: Continue pulmonary toilet.  Will remove chest tubes Renal: stable  Good uop.  Pressure too low for diuresis.  Will give albumin GI: nausea improved.  Tolerating diet Heme: stable ID: [email or web address] Endo: SSI Dispo: continue ICU care   Lajuana Matte 07/10/2020 9:21 AM

## 2020-07-11 ENCOUNTER — Encounter (HOSPITAL_COMMUNITY): Payer: Self-pay | Admitting: Thoracic Surgery (Cardiothoracic Vascular Surgery)

## 2020-07-11 LAB — BPAM RBC
Blood Product Expiration Date: 202207012359
Blood Product Expiration Date: 202207052359
Blood Product Expiration Date: 202207062359
Blood Product Expiration Date: 202207062359
ISSUE DATE / TIME: 202206030007
ISSUE DATE / TIME: 202206030026
Unit Type and Rh: 5100
Unit Type and Rh: 5100
Unit Type and Rh: 5100
Unit Type and Rh: 5100

## 2020-07-11 LAB — TYPE AND SCREEN
ABO/RH(D): O NEG
Antibody Screen: NEGATIVE
Unit division: 0
Unit division: 0
Unit division: 0
Unit division: 0

## 2020-07-11 LAB — GLUCOSE, CAPILLARY
Glucose-Capillary: 114 mg/dL — ABNORMAL HIGH (ref 70–99)
Glucose-Capillary: 123 mg/dL — ABNORMAL HIGH (ref 70–99)
Glucose-Capillary: 90 mg/dL (ref 70–99)
Glucose-Capillary: 107 mg/dL — ABNORMAL HIGH (ref 70–99)

## 2020-07-11 MED ORDER — SODIUM CHLORIDE 0.9% FLUSH
3.0000 mL | Freq: Two times a day (BID) | INTRAVENOUS | Status: DC
  Administered 2020-07-11 – 2020-07-12 (×2): 3 mL via INTRAVENOUS

## 2020-07-11 MED ORDER — ~~LOC~~ CARDIAC SURGERY, PATIENT & FAMILY EDUCATION: Freq: Once | Status: AC

## 2020-07-11 MED ORDER — SODIUM CHLORIDE 0.9% FLUSH: 3.0000 mL | INTRAVENOUS | Status: DC | PRN

## 2020-07-11 MED ORDER — SODIUM CHLORIDE 0.9 % IV SOLN: 250.0000 mL | INTRAVENOUS | Status: DC | PRN

## 2020-07-11 NOTE — Progress Notes (Signed)
      [address]       [place] [address]             [phone]                 3 Days Post-Op Procedure(s) (LRB): CORONARY ARTERY BYPASS GRAFTING (CABG) X  THREE ,ON PUMP, USING LEFT INTERNAL MAMMARY ARTERY AND RIGHT ENDOSPSCOPIC GREAT SAPHENOUS VEIN HARVEST CONDUITS (N/A) TRANSESOPHAGEAL ECHOCARDIOGRAM (TEE) (N/A)   Events: No events. Ambulated several times without symptoms  _______________________________________________________________ Vitals: BP 91/64   Pulse 85   Temp 97.9 F (36.6 C) (Oral)   Resp 15   Ht 5\' 10"  (1.778 m)   Wt 85.5 kg   SpO2 90%   BMI 27.05 kg/m   - Neuro: Alert, NAD  - Cardiovascular: Sinus.    Drips: None.      - Pulm: Easy work of breathing  ABG    Component Value Date/Time   PHART 7.324 (L) 07/08/2020 1829   PCO2ART 45.8 07/08/2020 1829   PO2ART 126 (H) 07/08/2020 1829   HCO3 23.8 07/08/2020 1829   TCO2 25 07/08/2020 1829   ACIDBASEDEF 2.0 07/08/2020 1829   O2SAT 99.0 07/08/2020 1829    - Abd: Nondistended - Extremity: Warm  .Intake/Output      06/04 0701 06/05 0700 06/05 0701 06/06 0700   P.O. 680 240   I.V. (mL/kg)     IV Piggyback     Total Intake(mL/kg) 680 (8) 240 (2.8)   Urine (mL/kg/hr) 1680 (0.8)    Stool 0    Chest Tube 20    Total Output 1700    Net -1020 +240        Urine Occurrence 1 x    Stool Occurrence 1 x 1 x      _______________________________________________________________ Labs: CBC Latest Ref Rng & Units 07/10/2020 07/09/2020 07/09/2020  WBC 4.0 - 10.5 K/uL 12.2(H) 16.6(H) 15.7(H)  Hemoglobin 13.0 - 17.0 g/dL 8.6(L) 8.7(L) 8.6(L)  Hematocrit 39.0 - 52.0 % 26.9(L) 28.0(L) 27.0(L)  Platelets 150 - 400 K/uL 171 187 181   CMP Latest Ref Rng & Units 07/10/2020 07/09/2020 07/09/2020  Glucose 70 - 99 mg/dL 125(H) 147(H) 123(H)  BUN 8 - 23 mg/dL 13 15 15   Creatinine 0.61 - 1.24 mg/dL 0.99 0.98 1.04  Sodium 135 - 145 mmol/L 137 134(L) 136  Potassium 3.5 - 5.1 mmol/L 4.1 4.4 4.9   Chloride 98 - 111 mmol/L 102 100 103  CO2 22 - 32 mmol/L 31 27 26   Calcium 8.9 - 10.3 mg/dL 8.5(L) 8.4(L) 8.3(L)  Total Protein 6.5 - 8.1 g/dL - - -  Total Bilirubin 0.3 - 1.2 mg/dL - - -  Alkaline Phos 38 - 126 U/L - - -  AST 15 - 41 U/L - - -  ALT 0 - 44 U/L - - -    CXR: -  _______________________________________________________________  Assessment and Plan: POD 3 s/p CABG x3  Neuro: Pain controlled CV: soft blood pressures.  Will hold BB.  No midodrine due to hx of QT prolongation Pulm: Continue pulmonary toilet.   Renal: stable  Good uop.  Pressure too low for diuresis.   GI: nausea improved.  Tolerating diet Heme: stable ID: [email or web address] Endo: SSI Dispo: floor   Lajuana Matte 07/11/2020 9:33 AM

## 2020-07-11 NOTE — Progress Notes (Signed)
Mobility Specialist - Progress Note   07/11/20 1511  Mobility  Activity Ambulated in hall  Level of [place] wheel walker  Distance Ambulated (ft) 470 ft  Mobility Ambulated with assistance in hallway  Mobility Response Tolerated well  Mobility performed by Mobility specialist  $Mobility charge 1 Mobility   Pt asx throughout ambulation. He walked 100 ft w/ the RW, then walked the rest of the w/o an AD. He did not require any steadying assist. Pt to recliner after walk, VSS.   Pricilla Handler Mobility Specialist Mobility Specialist Phone: [phone]

## 2020-07-12 ENCOUNTER — Encounter (HOSPITAL_COMMUNITY): Payer: Self-pay | Admitting: Thoracic Surgery (Cardiothoracic Vascular Surgery)

## 2020-07-12 DIAGNOSIS — E785 Hyperlipidemia, unspecified: Secondary | ICD-10-CM | POA: Diagnosis not present

## 2020-07-12 DIAGNOSIS — I214 Non-ST elevation (NSTEMI) myocardial infarction: Secondary | ICD-10-CM | POA: Diagnosis not present

## 2020-07-12 DIAGNOSIS — I10 Essential (primary) hypertension: Secondary | ICD-10-CM | POA: Diagnosis not present

## 2020-07-12 LAB — CBC
HCT: 25.2 % — ABNORMAL LOW (ref 39.0–52.0)
Hemoglobin: 8 g/dL — ABNORMAL LOW (ref 13.0–17.0)
MCH: 29.3 pg (ref 26.0–34.0)
MCHC: 31.7 g/dL (ref 30.0–36.0)
MCV: 92.3 fL (ref 80.0–100.0)
Platelets: 197 10*3/uL (ref 150–400)
RBC: 2.73 MIL/uL — ABNORMAL LOW (ref 4.22–5.81)
RDW: 14.5 % (ref 11.5–15.5)
WBC: 7.7 10*3/uL (ref 4.0–10.5)
nRBC: 0 % (ref 0.0–0.2)

## 2020-07-12 LAB — BASIC METABOLIC PANEL
Anion gap: 7 (ref 5–15)
BUN: 14 mg/dL (ref 8–23)
CO2: 29 mmol/L (ref 22–32)
Calcium: 8.5 mg/dL — ABNORMAL LOW (ref 8.9–10.3)
Chloride: 104 mmol/L (ref 98–111)
Creatinine, Ser: 0.91 mg/dL (ref 0.61–1.24)
GFR, Estimated: >60 mL/min (ref >60)
Glucose, Bld: 106 mg/dL — ABNORMAL HIGH (ref 70–99)
Potassium: 3.6 mmol/L (ref 3.5–5.1)
Sodium: 140 mmol/L (ref 135–145)

## 2020-07-12 LAB — GLUCOSE, CAPILLARY
Glucose-Capillary: 101 mg/dL — ABNORMAL HIGH (ref 70–99)
Glucose-Capillary: 140 mg/dL — ABNORMAL HIGH (ref 70–99)
Glucose-Capillary: 100 mg/dL — ABNORMAL HIGH (ref 70–99)
Glucose-Capillary: 100 mg/dL — ABNORMAL HIGH (ref 70–99)

## 2020-07-12 MED ORDER — POTASSIUM CHLORIDE CRYS ER 20 MEQ PO TBCR
40.0000 meq | EXTENDED_RELEASE_TABLET | Freq: Once | ORAL | Status: AC
  Administered 2020-07-12: 40 meq via ORAL
  Filled 2020-07-12: qty 2

## 2020-07-12 NOTE — Progress Notes (Addendum)
      [address]       [place] [address]             [phone]      4 Days Post-Op Procedure(s) (LRB): CORONARY ARTERY BYPASS GRAFTING (CABG) X  THREE ,ON PUMP, USING LEFT INTERNAL MAMMARY ARTERY AND RIGHT ENDOSPSCOPIC GREAT SAPHENOUS VEIN HARVEST CONDUITS (N/A) TRANSESOPHAGEAL ECHOCARDIOGRAM (TEE) (N/A) Subjective: Feels okay this morning, hasn't walked today yet  Objective: Vital signs in last 24 hours: Temp:  [97.9 F (36.6 C)-98.4 F (36.9 C)] 98.4 F (36.9 C) (06/06 0352) Pulse Rate:  [70-91] 70 (06/06 0352) Cardiac Rhythm: Normal sinus rhythm (06/05 1900) Resp:  [15-22] 16 (06/06 0352) BP: (91-135)/(57-86) 111/76 (06/06 0352) SpO2:  [90 %-96 %] 96 % (06/05 2103) Weight:  [85 kg-88.3 kg] 85 kg (06/06 0352)     Intake/Output from previous day: 06/05 0701 - 06/06 0700 In: 1440 [P.O.:1440] Out: 1075 [Urine:1075] Intake/Output this shift: No intake/output data recorded.  General appearance: alert, cooperative and no distress Heart: regular rate and rhythm, S1, S2 normal, no murmur, click, rub or gallop Lungs: clear to auscultation bilaterally Abdomen: soft, non-tender; bowel sounds normal; no masses,  no organomegaly Extremities: extremities normal, atraumatic, no cyanosis or edema Wound: clean and dry  Lab Results: Recent Labs    07/10/20 0544 07/12/20 0424  WBC 12.2* 7.7  HGB 8.6* 8.0*  HCT 26.9* 25.2*  PLT 171 197   BMET:  Recent Labs    07/10/20 0544 07/12/20 0424  NA 137 140  K 4.1 3.6  CL 102 104  CO2 31 29  GLUCOSE 125* 106*  BUN 13 14  CREATININE 0.99 0.91  CALCIUM 8.5* 8.5*    PT/INR: No results for input(s): LABPROT, INR in the last 72 hours. ABG    Component Value Date/Time   PHART 7.324 (L) 07/08/2020 1829   HCO3 23.8 07/08/2020 1829   TCO2 25 07/08/2020 1829   ACIDBASEDEF 2.0 07/08/2020 1829   O2SAT 99.0 07/08/2020 1829   CBG (last 3)  Recent Labs    07/11/20 1627 07/11/20 2108 07/12/20 0641  GLUCAP  90 107* 101*    Assessment/Plan: S/P Procedure(s) (LRB): CORONARY ARTERY BYPASS GRAFTING (CABG) X  THREE ,ON PUMP, USING LEFT INTERNAL MAMMARY ARTERY AND RIGHT ENDOSPSCOPIC GREAT SAPHENOUS VEIN HARVEST CONDUITS (N/A) TRANSESOPHAGEAL ECHOCARDIOGRAM (TEE) (N/A)  1. CV-NSR in the 70s, BP stable, no midodrine due to prolonged QT 2. Pulm- no recent CXR. Tolerating room air with good oxygen saturation 3. Renal-creatinine 0.91, replace potassium. Not on diuretics 4. Endo-blood glucose well controlled 5. H and H 8.0/25.2, stable  Plan: No EPW. Encouraged ambulation this morning. Nausea has improved, + BM. Hopeful for discharge tomorrow as long as he remains stable.     LOS: 8 days    Elgie Collard 07/12/2020   Agree with above Doing well BP improved Home tomorrow.  Cortne Amara Bary Leriche

## 2020-07-12 NOTE — Progress Notes (Signed)
CARDIAC REHAB PHASE I   PRE:  Rate/Rhythm: 85 SR  BP:  Supine: 142/85  Sitting:   Standing:    SaO2: 92%RA  MODE:  Ambulation: 470 ft   POST:  Rate/Rhythm: 106 ST  BP:  Supine:   Sitting: 122/88  Standing:    SaO2: 95%RA [id number] Pt walked 470 ft on RA with hand held asst and gait steady. Tolerated well. To recliner after walk. Call bell in reach . Helped pt order his breakfast. Encouraged more walks today.   Graylon Good, RN BSN  07/12/2020 8:36 AM

## 2020-07-12 NOTE — Care Management Important Message (Signed)
Important Message  Patient Details  Name: KAI CALICO MRN: [id number] Date of Birth: 03-01-1945   Medicare Important Message Given:  Yes     Shelda Altes 07/12/2020, 10:56 AM

## 2020-07-12 NOTE — Progress Notes (Signed)
Mobility Specialist: Progress Note   07/12/20 1307  Mobility  Activity Ambulated in hall  Level of Assistance Independent  Assistive Device None  Distance Ambulated (ft) 470 ft  Mobility Ambulated independently in hallway  Mobility Response Tolerated well  Mobility performed by Mobility specialist  $Mobility charge 1 Mobility   Pre-Mobility: 84 HR, 92% SpO2 Post-Mobility: 96 HR, 95% SpO2  Pt asx during ambulation. Pt sitting EOB after walk with call bell at his side.   [facility] Pradeep Beaubrun Mobility Specialist Mobility Specialist Phone: [phone]

## 2020-07-12 NOTE — Progress Notes (Signed)
Progress Note  Patient Name: Blake Sutton Date of Encounter: 07/12/2020  [facility] Cardiologist: Candee Furbish, MD   Subjective   Feels well. Denies pain, dyspnea, nausea improved. Has ambulated.   Inpatient Medications    Scheduled Meds: . acetaminophen  1,000 mg Oral Q6H   Or  . acetaminophen (TYLENOL) oral liquid 160 mg/5 mL  1,000 mg Per Tube Q6H  . aspirin EC  325 mg Oral Daily   Or  . aspirin  324 mg Per Tube Daily  . atorvastatin  80 mg Oral QHS  . bisacodyl  10 mg Oral Daily   Or  . bisacodyl  10 mg Rectal Daily  . buPROPion  100 mg Oral BID  . docusate sodium  200 mg Oral Daily  . enoxaparin (LOVENOX) injection  40 mg Subcutaneous QHS  . FLUoxetine  20 mg Oral Daily  . insulin aspart  0-24 Units Subcutaneous TID WC  . insulin aspart  0-5 Units Subcutaneous QHS  . metoprolol tartrate  12.5 mg Oral BID   Or  . metoprolol tartrate  12.5 mg Per Tube BID  . pantoprazole  40 mg Oral Daily  . sodium chloride flush  3 mL Intravenous Q12H  . sodium chloride flush  3 mL Intravenous Q12H  . sodium chloride flush  3 mL Intravenous Q12H  . sodium chloride flush  3 mL Intravenous Q12H  . tamsulosin  0.4 mg Oral Daily  . traZODone  50 mg Oral QHS  . umeclidinium bromide  1 puff Inhalation Daily   Continuous Infusions: . sodium chloride    . sodium chloride    . sodium chloride    . sodium chloride    . sodium chloride    . electrolyte-A Stopped (07/09/20 0076)  . insulin Stopped (07/09/20 1044)  . lactated ringers    . lactated ringers Stopped (07/09/20 1044)  . lactated ringers Stopped (07/09/20 0738)  . promethazine (PHENERGAN) injection (IM or IVPB) Stopped (07/09/20 1617)   PRN Meds: sodium chloride, sodium chloride, sodium chloride, acetaminophen, albuterol, dextrose, fentaNYL (SUBLIMAZE) injection, lactated ringers, LORazepam, metoprolol tartrate, midazolam, ondansetron (ZOFRAN) IV, oxyCODONE, promethazine (PHENERGAN) injection (IM or IVPB), sodium  chloride flush, sodium chloride flush, sodium chloride flush, zolpidem   Vital Signs    Vitals:   07/11/20 2336 07/12/20 0352 07/12/20 0801 07/12/20 0824  BP: 135/85 111/76 (!) 142/85 122/88  Pulse: 88 70 86   Resp: 20 16 17 20   Temp: 98.4 F (36.9 C) 98.4 F (36.9 C) 99.1 F (37.3 C)   TempSrc: Oral Oral Oral   SpO2:   96%   Weight:  85 kg    Height:        Intake/Output Summary (Last 24 hours) at 07/12/2020 0954 Last data filed at 07/11/2020 1900 Gross per 24 hour  Intake 1200 ml  Output 925 ml  Net 275 ml   Last 3 Weights 07/12/2020 07/11/2020 07/11/2020  Weight (lbs) 187 lb 4.8 oz 194 lb 10.7 oz 188 lb 7.9 oz  Weight (kg) 84.959 kg 88.3 kg 85.5 kg      Telemetry    NSR - Personally Reviewed  ECG    No new tracing - Personally Reviewed  Physical Exam  Appears well GEN: No acute distress.   Neck: No JVD Cardiac: RRR, no murmurs, rubs, or gallops.  Respiratory: Clear to auscultation bilaterally. GI: Soft, nontender, non-distended  MS: No edema; No deformity. Neuro:  Nonfocal  Psych: Normal affect   Labs  High Sensitivity Troponin:   Recent Labs  Lab 07/03/20 2346 07/04/20 0304 07/04/20 0938 07/04/20 1332  TROPONINIHS 367* 1,224* 2,594* 2,193*      Chemistry Recent Labs  Lab 07/09/20 1737 07/10/20 0544 07/12/20 0424  NA 134* 137 140  K 4.4 4.1 3.6  CL 100 102 104  CO2 27 31 29   GLUCOSE 147* 125* 106*  BUN 15 13 14   CREATININE 0.98 0.99 0.91  CALCIUM 8.4* 8.5* 8.5*  GFRNONAA >60 >60 >60  ANIONGAP 7 4* 7     Hematology Recent Labs  Lab 07/09/20 1737 07/10/20 0544 07/12/20 0424  WBC 16.6* 12.2* 7.7  RBC 3.00* 2.89* 2.73*  HGB 8.7* 8.6* 8.0*  HCT 28.0* 26.9* 25.2*  MCV 93.3 93.1 92.3  MCH 29.0 29.8 29.3  MCHC 31.1 32.0 31.7  RDW 14.6 14.6 14.5  PLT 187 171 197    BNP No results for input(s): BNP, PROBNP in the last 168 hours.   DDimer  No results for input(s): DDIMER in the last 168 hours.   Radiology    No results  found.  Cardiac Studies    Echo 07/04/2020: Normal LVEF of 55 to 60% with basal inferolateral HK. Mild LVH. Indeterminate diastolic pressures with elevated LAP. Mildly elevated PAP with normal RAP.Marland Kitchen Moderate MR. Mild aortic sclerosis with no stenosis.Marland Kitchen  CATH 07/06/2020  1st Mrg lesion is 75% stenosed.  Mid Cx lesion is 99% stenosed.  Ost LAD to Prox LAD lesion is 60% stenosed.  Mid LAD lesion is 90% stenosed. Ectatic area in the mid LAD at the end of the stenosis.  Mid LAD-2 lesion is 50% stenosed.  LV end diastolic pressure is normal.  There is no aortic valve stenosis.   Left dominant circulation.   Calcific OM1 and LAD disease.  Moderate ostial LAD disease.  Culprit was lesion in the circumflex that was heavily thrombotic with high risk of distal embolization if PCI attempted.  Continue anticoagulation.  Cardiac surgery consult.  Consider GP IIb/IIIa inhibitor depending on timing of the surgery. Diagnostic Dominance: Left        Patient Profile     75 y.o. male with history of hypertension, hyperlipidemia and COPD presenting withchest pain and found to have a NSTEMI.Cardiac cath 07/06/20 with severe double vessel CAD involving the LAD and Circumflex. 3V CABG on 07/08/20.   Assessment & Plan    Doing well day#4 post CABG. No CHF, no arrhythmia. Ideally, DC w clopidogrel for 12 months since he presented with ACS. On statin, ASA and metoprolol. Will need yearly CTA monitoring for Asc Ao Aneurysm.  CARDIOLOGY RECOMMENDATIONS:  Discharge is anticipated in the next 48 hours. Recommendations for medications and follow up:  Discharge Medications: Continue medications as they are currently listed in the [facility]. Exceptions to the above:  none  Follow Up: The patient's Primary Cardiologist is Candee Furbish, MD     Follow up in the office in 2 week(s).  Signed,  Dorion Petillo Martinique, MD  9:56 AM 07/12/2020  [facility]       For questions or updates, please  contact [facility] Please consult [email or web address] for contact info under        Signed, Blue Ruggerio Martinique, MD  07/12/2020, 9:54 AM

## 2020-07-13 ENCOUNTER — Telehealth: Payer: Self-pay | Admitting: Physician Assistant

## 2020-07-13 ENCOUNTER — Other Ambulatory Visit (HOSPITAL_COMMUNITY): Payer: Self-pay

## 2020-07-13 LAB — GLUCOSE, CAPILLARY: Glucose-Capillary: 94 mg/dL (ref 70–99)

## 2020-07-13 MED ORDER — ASPIRIN 325 MG PO TBEC: 325.0000 mg | DELAYED_RELEASE_TABLET | Freq: Every day | ORAL | 0 refills | Status: DC

## 2020-07-13 MED ORDER — PANTOPRAZOLE SODIUM 40 MG PO TBEC
40.0000 mg | DELAYED_RELEASE_TABLET | Freq: Every day | ORAL | 11 refills | Status: DC
  Filled 2020-07-13: qty 30, 30d supply, fill #0

## 2020-07-13 MED ORDER — ATORVASTATIN CALCIUM 80 MG PO TABS
80.0000 mg | ORAL_TABLET | Freq: Every day | ORAL | 1 refills | Status: DC
  Filled 2020-07-13: qty 30, 30d supply, fill #0

## 2020-07-13 MED ORDER — METOPROLOL TARTRATE 25 MG PO TABS
12.5000 mg | ORAL_TABLET | Freq: Two times a day (BID) | ORAL | 1 refills | Status: DC
  Filled 2020-07-13: qty 30, 30d supply, fill #0

## 2020-07-13 MED ORDER — ASPIRIN 81 MG PO TBEC: 81.0000 mg | DELAYED_RELEASE_TABLET | Freq: Every day | ORAL | Status: AC

## 2020-07-13 MED ORDER — CLOPIDOGREL BISULFATE 75 MG PO TABS
75.0000 mg | ORAL_TABLET | Freq: Every day | ORAL | 11 refills | Status: DC
  Filled 2020-07-13: qty 30, 30d supply, fill #0

## 2020-07-13 MED ORDER — HYDROCODONE-ACETAMINOPHEN 5-325 MG PO TABS
1.0000 | ORAL_TABLET | Freq: Four times a day (QID) | ORAL | 0 refills | Status: AC | PRN
  Filled 2020-07-13: qty 28, 7d supply, fill #0

## 2020-07-13 MED FILL — Potassium Chloride Inj 2 mEq/ML: INTRAVENOUS | Qty: 40 | Status: AC

## 2020-07-13 MED FILL — Sodium Bicarbonate IV Soln 8.4%: INTRAVENOUS | Qty: 50 | Status: AC

## 2020-07-13 MED FILL — Sodium Chloride IV Soln 0.9%: INTRAVENOUS | Qty: 3000 | Status: AC

## 2020-07-13 MED FILL — Calcium Chloride Inj 10%: INTRAVENOUS | Qty: 10 | Status: AC

## 2020-07-13 MED FILL — Heparin Sodium (Porcine) Inj 1000 Unit/ML: INTRAMUSCULAR | Qty: 30 | Status: AC

## 2020-07-13 MED FILL — Mannitol IV Soln 20%: INTRAVENOUS | Qty: 500 | Status: AC

## 2020-07-13 MED FILL — Lidocaine HCl Local Preservative Free (PF) Inj 2%: INTRAMUSCULAR | Qty: 15 | Status: AC

## 2020-07-13 MED FILL — Electrolyte-R (PH 7.4) Solution: INTRAVENOUS | Qty: 4000 | Status: AC

## 2020-07-13 MED FILL — Heparin Sodium (Porcine) Inj 1000 Unit/ML: INTRAMUSCULAR | Qty: 10 | Status: AC

## 2020-07-13 NOTE — Plan of Care (Signed)
  Problem: Education: Goal: Understanding of cardiac disease, CV risk reduction, and recovery process will improve Outcome: Adequate for Discharge Goal: Understanding of medication regimen will improve Outcome: Adequate for Discharge Goal: Individualized Educational Video(s) Outcome: Adequate for Discharge   Problem: Activity: Goal: Ability to tolerate increased activity will improve Outcome: Adequate for Discharge   Problem: Cardiac: Goal: Ability to achieve and maintain adequate cardiopulmonary perfusion will improve Outcome: Adequate for Discharge Goal: Vascular access site(s) Level 0-1 will be maintained Outcome: Adequate for Discharge   Problem: Health Behavior/Discharge Planning: Goal: Ability to safely manage health-related needs after discharge will improve Outcome: Adequate for Discharge   Problem: Education: Goal: Knowledge of General Education information will improve Description: Including pain rating scale, medication(s)/side effects and non-pharmacologic comfort measures Outcome: Adequate for Discharge   Problem: Health Behavior/Discharge Planning: Goal: Ability to manage health-related needs will improve Outcome: Adequate for Discharge   Problem: Clinical Measurements: Goal: Ability to maintain clinical measurements within normal limits will improve Outcome: Adequate for Discharge Goal: Will remain free from infection Outcome: Adequate for Discharge Goal: Diagnostic test results will improve Outcome: Adequate for Discharge Goal: Respiratory complications will improve Outcome: Adequate for Discharge Goal: Cardiovascular complication will be avoided Outcome: Adequate for Discharge   Problem: Activity: Goal: Risk for activity intolerance will decrease Outcome: Adequate for Discharge   Problem: Nutrition: Goal: Adequate nutrition will be maintained Outcome: Adequate for Discharge   Problem: Coping: Goal: Level of anxiety will decrease Outcome: Adequate  for Discharge   Problem: Elimination: Goal: Will not experience complications related to bowel motility Outcome: Adequate for Discharge Goal: Will not experience complications related to urinary retention Outcome: Adequate for Discharge   Problem: Pain Managment: Goal: General experience of comfort will improve Outcome: Adequate for Discharge   Problem: Safety: Goal: Ability to remain free from injury will improve Outcome: Adequate for Discharge   Problem: Skin Integrity: Goal: Risk for impaired skin integrity will decrease Outcome: Adequate for Discharge

## 2020-07-13 NOTE — Telephone Encounter (Signed)
    Attention TOC pool,  This patient will need a TOC phone call after discharge. They are being discharged today. Follow-up appointment has already been arranged with: [name] on 7/5. FYI This is outside ideal 2 week window due to next available appt, but would still benefit from [facility] call given post-CABG. They are a patient of Candee Furbish, MD.  Thank you! Charlie Pitter, PA-C

## 2020-07-13 NOTE — Progress Notes (Signed)
Pt discharged per MD order. Discharge instructions reviewed with pt. Pt verbalized understanding and questions were answered to satisfaction. IV and tele removed. Pt will be escorted to private home via private vehicle by his son.

## 2020-07-13 NOTE — Progress Notes (Addendum)
[address]       [place] [address]             [phone]      5 Days Post-Op Procedure(s) (LRB): CORONARY ARTERY BYPASS GRAFTING (CABG) X  THREE ,ON PUMP, USING LEFT INTERNAL MAMMARY ARTERY AND RIGHT ENDOSPSCOPIC GREAT SAPHENOUS VEIN HARVEST CONDUITS (N/A) TRANSESOPHAGEAL ECHOCARDIOGRAM (TEE) (N/A) Subjective: Feels well  Objective: Vital signs in last 24 hours: Temp:  [98 F (36.7 C)-99.1 F (37.3 C)] 98 F (36.7 C) (06/07 0403) Pulse Rate:  [72-87] 75 (06/07 0403) Cardiac Rhythm: Normal sinus rhythm (06/06 1900) Resp:  [13-20] 18 (06/07 0403) BP: (109-142)/(66-88) 138/86 (06/07 0403) SpO2:  [92 %-98 %] 98 % (06/07 0403) Weight:  [84.8 kg] 84.8 kg (06/07 0403)  Hemodynamic parameters for last 24 hours:    Intake/Output from previous day: 06/06 0701 - 06/07 0700 In: 1830 [P.O.:1830] Out: 850 [Urine:850] Intake/Output this shift: No intake/output data recorded.  General appearance: alert, cooperative and no distress Heart: regular rate and rhythm Lungs: clear to auscultation bilaterally Abdomen: benign Extremities: no edema Wound: incis healing well  Lab Results: Recent Labs    07/12/20 0424  WBC 7.7  HGB 8.0*  HCT 25.2*  PLT 197   BMET:  Recent Labs    07/12/20 0424  NA 140  K 3.6  CL 104  CO2 29  GLUCOSE 106*  BUN 14  CREATININE 0.91  CALCIUM 8.5*    PT/INR: No results for input(s): LABPROT, INR in the last 72 hours. ABG    Component Value Date/Time   PHART 7.324 (L) 07/08/2020 1829   HCO3 23.8 07/08/2020 1829   TCO2 25 07/08/2020 1829   ACIDBASEDEF 2.0 07/08/2020 1829   O2SAT 99.0 07/08/2020 1829   CBG (last 3)  Recent Labs    07/12/20 1719 07/12/20 2119 07/13/20 0603  GLUCAP 100* 100* 94    Meds Scheduled Meds: . acetaminophen  1,000 mg Oral Q6H   Or  . acetaminophen (TYLENOL) oral liquid 160 mg/5 mL  1,000 mg Per Tube Q6H  . aspirin EC  325 mg Oral Daily   Or  . aspirin  324 mg Per Tube Daily   . atorvastatin  80 mg Oral QHS  . bisacodyl  10 mg Oral Daily   Or  . bisacodyl  10 mg Rectal Daily  . buPROPion  100 mg Oral BID  . docusate sodium  200 mg Oral Daily  . enoxaparin (LOVENOX) injection  40 mg Subcutaneous QHS  . FLUoxetine  20 mg Oral Daily  . insulin aspart  0-24 Units Subcutaneous TID WC  . insulin aspart  0-5 Units Subcutaneous QHS  . metoprolol tartrate  12.5 mg Oral BID   Or  . metoprolol tartrate  12.5 mg Per Tube BID  . pantoprazole  40 mg Oral Daily  . sodium chloride flush  3 mL Intravenous Q12H  . sodium chloride flush  3 mL Intravenous Q12H  . sodium chloride flush  3 mL Intravenous Q12H  . sodium chloride flush  3 mL Intravenous Q12H  . tamsulosin  0.4 mg Oral Daily  . traZODone  50 mg Oral QHS  . umeclidinium bromide  1 puff Inhalation Daily   Continuous Infusions: . sodium chloride    . sodium chloride    . sodium chloride    . sodium chloride    . sodium chloride    . electrolyte-A Stopped (07/09/20 3419)  . insulin Stopped (07/09/20  1044)  . lactated ringers    . lactated ringers Stopped (07/09/20 1044)  . lactated ringers Stopped (07/09/20 0738)  . promethazine (PHENERGAN) injection (IM or IVPB) Stopped (07/09/20 1617)   PRN Meds:.sodium chloride, sodium chloride, sodium chloride, acetaminophen, albuterol, dextrose, fentaNYL (SUBLIMAZE) injection, lactated ringers, LORazepam, metoprolol tartrate, midazolam, ondansetron (ZOFRAN) IV, oxyCODONE, promethazine (PHENERGAN) injection (IM or IVPB), sodium chloride flush, sodium chloride flush, sodium chloride flush, zolpidem  Xrays No results found.  Assessment/Plan: S/P Procedure(s) (LRB): CORONARY ARTERY BYPASS GRAFTING (CABG) X  THREE ,ON PUMP, USING LEFT INTERNAL MAMMARY ARTERY AND RIGHT ENDOSPSCOPIC GREAT SAPHENOUS VEIN HARVEST CONDUITS (N/A) TRANSESOPHAGEAL ECHOCARDIOGRAM (TEE) (N/A)  1 Tmax 99.1, VSS, NSR 2 sats good on RA 3 weight stable 4 no new labs 5 BS stable, no DM h/o 6  appears quite stable for d/c to home       LOS: 9 days    [name] PA-C Pager [phone] 07/13/2020   Agree with above Home today  [place]

## 2020-07-13 NOTE — Progress Notes (Signed)
[name] Education completed with pt who voiced understanding. Reviewed sternal precautions and staying in the tube. Encouraged IS and walking (gave guidelines for ex). Discussed heart healthy food choices and gave diet sheet. Discussed CRP 2 and pt would prefer [place] referral. Gave smoking cessation handout and pt is really going to try to quit on his own. Encouraged to call [phone] if needed. Discussed wound care. Pt stated his son had CABG few years back and familiar with instructions. Graylon Good RN BSN 07/13/2020 8:59 AM

## 2020-07-14 NOTE — Telephone Encounter (Signed)
**Note De-Identified Dorrell Mitcheltree Obfuscation** I called back and s/w the pt's son's girlfriend per the pts verbal request.  We discussed the questions that the pt and I spoke about earlier and she is in agreement with the pts answers to all TCTS/TCM Call questions I asked him. I answered all of her questions and she thanked me for calling her back.

## 2020-07-14 NOTE — Telephone Encounter (Signed)
Patient call back with daughter on the phone given permission to speak with her because he was unsure of what was explain to him. Please give daughter call back and explain everything to her. Please advise

## 2020-07-14 NOTE — Telephone Encounter (Signed)
**Note De-Identified Blake Sutton Obfuscation** Patient contacted regarding discharge from [facility] on 07/13/2020.  Patient understands to follow up with provider [name], PA-c on 08/10/2020 at 8:45 at [address] [place] in [place], [place] [address]. Patient understands discharge instructions? Yes Patient understands medications and regiment? Yes Patient understands to bring all medications to this visit? Yes  Ask patient:  Are you enrolled in My Chart : No, he states that he is not interested and would rather call us if he needs to.  Postop Surgical Patients:                What is your wound status?   Per the pt "looks good".   Any signs/ symptoms of infection (Temp, redness/ red streaks, swelling, purulent drainage, foul odor or smell)? Per the pt  "No".  .             Please do not place any creams/ lotions/ or antibiotic ointment on any surgical incisions/ wounds without physician approval. The Pt is advised and he verbalized understanding.  .             Do you have any questions about your medications?  "No, none at all" per the pt. All medications (except pain medications) are to be filled by your Cardiologist AFTER your first post op. appointment with them.  Pt verbalized understanding.  Are you taking your pain medication? NO, as the pt states that he cannot tolerate pain medications of any kind as they cause him nausea and vomiting. He states that he is taking Tylenol 325 mg. And it is helping with his pain    .        How is your pain controlled? Per the pt "I am hurting some but not that bad".           Pain level? On a scale of 1 to 10 with 10 being the worst and 1 being the least amount of pain the pt rates his pain level at 4 or 5.  .             If you require a refill on pain medications, know that the same medication/ amount may not be prescribed or a refill may not be given.  Please contact your pharmacy for refill requests.  The Pt is advised and he verbalized understanding.  .              Do you have help at home with ADL's?  Yes, the pt states he has several people with him at all times.  .             Please refer to your Pre/post surgery booklet, there is a lot of useful information in it that may answer any questions you may have. The Pt is advised and he verbalized understanding.  .             Please note that it is ok to remove your surgical dressing, shower (soap/ water), and pat the incision dry. The Pt is advised and he verbalized understanding.

## 2020-07-16 ENCOUNTER — Encounter: Payer: Self-pay | Admitting: Thoracic Surgery (Cardiothoracic Vascular Surgery)

## 2020-07-16 ENCOUNTER — Telehealth (INDEPENDENT_AMBULATORY_CARE_PROVIDER_SITE_OTHER): Payer: Self-pay | Admitting: Thoracic Surgery (Cardiothoracic Vascular Surgery)

## 2020-07-16 ENCOUNTER — Other Ambulatory Visit: Payer: Self-pay

## 2020-07-16 DIAGNOSIS — Z951 Presence of aortocoronary bypass graft: Secondary | ICD-10-CM

## 2020-07-16 NOTE — Progress Notes (Signed)
     [address]       [place] [address]             [phone]       Patient: Home Provider: Office Consent for Telemedicine visit obtained.  Today's visit was completed via a real-time telehealth (see specific modality noted below). The patient/authorized person provided oral consent at the time of the visit to engage in a telemedicine encounter with the present provider at [facility]. The patient/authorized person was informed of the potential benefits, limitations, and risks of telemedicine. The patient/authorized person expressed understanding that the laws that protect confidentiality also apply to telemedicine. The patient/authorized person acknowledged understanding that telemedicine does not provide emergency services and that he or she would need to call 911 or proceed to the nearest hospital for help if such a need arose.   Total time spent in the clinical discussion 10 minutes.  Telehealth Modality: Phone visit (audio only)  I had a telephone visit with Mr. Mcdaid.  Overall doing well.  He is ambulating without difficulty.  His vitals have been stable.  He will follow-up in 1 month with CXR  [place]

## 2020-07-21 ENCOUNTER — Telehealth (HOSPITAL_COMMUNITY): Payer: Self-pay | Admitting: Pharmacist

## 2020-07-21 ENCOUNTER — Telehealth (HOSPITAL_COMMUNITY): Payer: Self-pay

## 2020-07-21 NOTE — Telephone Encounter (Signed)
Attempted to call patient in regards to Cardiac Rehab - LM on VM 

## 2020-07-21 NOTE — Telephone Encounter (Signed)
Pt insurance is active and benefits verified through [facility] Medicare. Co-pay $0.00, DED $0.00/$0.00 met, out of pocket $3,600.00/$3,600.00 met, co-insurance 0%. No pre-authorization required. Passport, 07/21/20 @ 10:27AM, TEL#[id number]   Will contact patient to see if he is interested in the Cardiac Rehab Program. If interested, patient will need to complete follow up appt. Once completed, patient will be contacted for scheduling upon review by the RN Navigator.

## 2020-07-21 NOTE — Telephone Encounter (Signed)
Tried to call, no answer.  Will try again another day.

## 2020-07-22 ENCOUNTER — Other Ambulatory Visit (HOSPITAL_COMMUNITY): Payer: Self-pay

## 2020-07-22 ENCOUNTER — Telehealth (HOSPITAL_COMMUNITY): Payer: Self-pay

## 2020-07-22 NOTE — Telephone Encounter (Signed)
Pharmacy Transitions of Care Follow-up Telephone Call  Date of discharge: 07/13/20  Discharge Diagnosis: NSTEMI  How have you been since you were released from the hospital?   Patient says he's doing well. Resting and walking and just a little sore but doing better. Has experience with blood thinners prior to hospital admission.   Medication changes made at discharge: START taking: aspirin  atorvastatin (LIPITOR)  clopidogrel (Plavix)  metoprolol tartrate (LOPRESSOR)  pantoprazole (PROTONIX)   Icon medications to stop taking  STOP taking: amLODipine 5 MG tablet (NORVASC)  atenolol 25 MG tablet (TENORMIN)  celecoxib 200 MG capsule (CELEBREX)  hydrochlorothiazide 25 MG tablet (HYDRODIURIL)  HYDROcodone-acetaminophen 5-325 MG tablet (NORCO/VICODIN)  meloxicam 15 MG tablet (MOBIC)  omeprazole 20 MG capsule (PRILOSEC)  sildenafil 100 MG tablet (VIAGRA)  simvastatin 40 MG tablet (ZOCOR)    Medication changes verified by the patient? Confirmed Clopidrogrel with patient but patient was unable to get to meds at time of call to confirm all changes.    Medication Accessibility:  Home Pharmacy: [facility] in [place]  Was the patient provided with refills on discharged medications? yes   Have all prescriptions been transferred from [facility] to home pharmacy?  yes  Is the patient able to afford medications? Has AARP    Medication Review:  CLOPIDOGREL (PLAVIX) Clopidogrel 75 mg once daily.  - Educated patient on expected duration of therapy with clopidogrel.  - Reviewed potential DDIs with patient  - Advised patient of medications to avoid (NSAIDs, ASA)  - Educated that Tylenol (acetaminophen) will be the preferred analgesic to prevent risk of bleeding  - Emphasized importance of monitoring for signs and symptoms of bleeding (abnormal bruising, prolonged bleeding, nose bleeds, bleeding from gums, discolored urine, black tarry stools)  - Advised patient to alert all providers of  anticoagulation therapy prior to starting a new medication or having a procedure   Follow-up Appointments:  PCP Hospital f/u appt confirmed? None   Specialist Hospital f/u appt confirmed? Dr. Kathlen Mody 08/10/20 with Cardiology  If their condition worsens, is the pt aware to call PCP or go to the Emergency Dept.? yes  Final Patient Assessment:  Patient doing well. Has refills at home pharmacy and follow up scheduled.

## 2020-07-26 ENCOUNTER — Telehealth: Payer: Self-pay | Admitting: Cardiology

## 2020-07-26 NOTE — Telephone Encounter (Signed)
Left message for patient to call back  

## 2020-07-26 NOTE — Telephone Encounter (Signed)
Spoke with patient who gave verbal consent to speak with Threasa Beards (his son's girlfriend) regarding his current status.  She reports pt has been having some episodes of confusion.  He has been having conversations with himself.  A few nights ago he reported having night mares when he hasn't dreamed at all in 40 years.  Melaine reports he is taking medications as listed on d/c instructions.  She went through all his bottles and is keeping up with his medication.  She recently determined pt had not been taking his Wellbutrin as ordered (from [place]) but only when he felt like he needed it.  She has been giving it to him since he has been home from the hospital as ordered.  She is concerned that is the problem and has decided to hold this medication to see if he has any improvement.  Advised she will need to discuss this with the MD at the [facility] who prescribed this.  Pt is scheduled for f/u and is aware of appt date/time.  They are aware he needs to complete a DPR when here so that we have in writing who we can speak to about his care.  All questions answered at the time of the call but they will c/b any questions/concerns.

## 2020-07-26 NOTE — Telephone Encounter (Signed)
Patient's daughter calling back in regards to Blake Sutton. She states the patient is outside and she can get him to give permission to speak with her. She states the patient does not know where his phone is half the time and also does not answer it. She states to call her back at: [phone]

## 2020-08-09 ENCOUNTER — Encounter: Payer: Self-pay | Admitting: Physician Assistant

## 2020-08-09 DIAGNOSIS — I712 Thoracic aortic aneurysm, without rupture, unspecified: Secondary | ICD-10-CM | POA: Insufficient documentation

## 2020-08-09 DIAGNOSIS — I700 Atherosclerosis of aorta: Secondary | ICD-10-CM | POA: Insufficient documentation

## 2020-08-09 NOTE — Progress Notes (Signed)
Cardiology Office Note:    Date:  08/10/2020   ID:  KOLLEN ARMENTI, DOB 1945-12-11, MRN [id number]  PCP:  No primary care provider on file.   [facility] Providers Cardiologist:  Candee Furbish, MD      Referring MD: No ref. provider found   Chief Complaint:  Hospitalization Follow-up (S/p CABG)    Patient Profile:    Blake Sutton is a 75 y.o. male with:  Coronary artery disease  NSTEMI S/p CABG 6/22 (L-LAD, S-LPDA/OM2 - Dr. Kipp Brood) (HFpEF) heart failure with preserved ejection fraction  Thoracic aortic aneurysm CT 5/22: 4.4 cm Aortic atherosclerosis  Hypertension  Hyperlipidemia  COPD +cigs   Prior CV studies: Carotid US 07/07/20 Summary:  Right Carotid: Velocities in the right ICA are consistent with a 1-39%  stenosis.  Left Carotid: Velocities in the left ICA are consistent with a 1-39%  stenosis.   LEFT HEART CATH 07/06/2020 Narrative  1st Mrg lesion is 75% stenosed.  Mid Cx lesion is 99% stenosed.  Ost LAD to Prox LAD lesion is 60% stenosed.  Mid LAD lesion is 90% stenosed. Ectatic area in the mid LAD at the end of the stenosis.  Mid LAD-2 lesion is 50% stenosed.  LV end diastolic pressure is normal.  There is no aortic valve stenosis.  Left dominant circulation.   Calcific OM1 and LAD disease.  Moderate ostial LAD disease.  Culprit was lesion in the circumflex that was heavily thrombotic with high risk of distal embolization if PCI attempted.  Continue anticoagulation.  Cardiac surgery consult.  Consider GP IIb/IIIa inhibitor depending on timing of the surgery.  Echocardiogram 07/04/20 Inf-lat HK, EF 55-60, mild LVH, normal RVSF, RVSP 38.3, mod MR, AV sclerosis w/o AS  Chest CTA 07/04/20 IMPRESSION: 1. No evidence of pulmonary embolus. 2. Bilateral perihilar ground-glass airspace disease, basilar predominant interlobular septal thickening, and bilateral pleural effusions as above. Favor fluid overload over infection. 3. Nonspecific mediastinal  lymphadenopathy, which may be reactive. 4. 4.4 cm ascending thoracic aortic aneurysm. Recommend annual imaging followup by CTA or MRA. This recommendation follows 2010 ACCF/AHA/AATS/ACR/ASA/SCA/SCAI/SIR/STS/SVM Guidelines for the Diagnosis and Management of Patients with Thoracic Aortic Disease. Circulation. 2010; 121: Y174-B449. Aortic aneurysm NOS (ICD10-I71.9) 5.  Aortic Atherosclerosis (ICD10-I70.0).  History of Present Illness: Blake Sutton was admitted 5/28-6/7 with a NSTEMI c/b acute (HFpEF) heart failure with preserved ejection fraction.  He was noted to have severe 2 v CAD on cath and was referred for CABG.  Post op course was notable for hypotension and therefore, he was not started on beta-blocker.  He returns for post hospital f/u.  He is here with his son.  Since discharge, he has done very well.  He is walking about a mile a day.  He has minimal chest soreness.  He has not had significant shortness of breath.  He has not had orthopnea, leg edema or syncope.  He has not had weakness, dizziness, near syncope.  He is still smoking a cigarette every now and then.  He is not interested in cardiac rehabilitation.    Past Medical History:  Diagnosis Date   Aortic atherosclerosis ([place])    CT 06/2020   CAD (coronary artery disease)    NSTEMI S/p CABG 6/22 (L-LAD, S-LPDA/OM2 - Dr. Kipp Brood)   COPD (chronic obstructive pulmonary disease) ([place])    Hypercholesteremia    Hypertension    Thoracic aortic aneurysm ([place])    CT 5/22: 4.4 cm >> rpt in 1 year    Current Medications:  Current Meds  Medication Sig   albuterol (PROVENTIL HFA;VENTOLIN HFA) 108 (90 BASE) MCG/ACT inhaler Inhale 1-2 puffs into the lungs every 6 (six) hours as needed for wheezing or shortness of breath.   aspirin 81 MG EC tablet Take 1 tablet (81 mg total) by mouth daily.   nitroGLYCERIN (NITROSTAT) 0.4 MG SL tablet Place 1 tablet (0.4 mg total) under the tongue every 5 (five) minutes as needed for chest pain.    pantoprazole (PROTONIX) 40 MG tablet Take 1 tablet (40 mg total) by mouth daily.   tamsulosin (FLOMAX) 0.4 MG CAPS capsule Take 0.4 mg by mouth daily.   [DISCONTINUED] atorvastatin (LIPITOR) 80 MG tablet Take 1 tablet (80 mg total) by mouth at bedtime.   [DISCONTINUED] clopidogrel (PLAVIX) 75 MG tablet Take 1 tablet (75 mg total) by mouth daily.   [DISCONTINUED] metoprolol tartrate (LOPRESSOR) 25 MG tablet Take 0.5 tablets (12.5 mg total) by mouth 2 (two) times daily.     Allergies:   Oxycodone and Pravastatin   Social History   Tobacco Use   Smoking status: Every Day    Pack years: 0.00   Smokeless tobacco: Never  Substance Use Topics   Alcohol use: No   Drug use: No     Family Hx: The patient's family history is not on file.  Review of Systems  Constitutional: Negative for decreased appetite and malaise/fatigue.  Gastrointestinal:  Negative for hematochezia and melena.  Genitourinary:  Negative for hematuria.    EKGs/Labs/Other Test Reviewed:    EKG:  EKG is   ordered today.  The ekg ordered today demonstrates normal sinus rhythm, HR 75, normal axis, nonspecific ST-T wave changes, QTC 455  Recent Labs: 07/03/2020: ALT 24 07/04/2020: B Natriuretic Peptide 1,155.9 07/09/2020: Magnesium 2.3 08/10/2020: BUN 18; Creatinine, Ser 1.09; Hemoglobin WILL FOLLOW; Platelets WILL FOLLOW; Potassium 4.5; Sodium 137   Recent Lipid Panel Lab Results  Component Value Date/Time   CHOL 170 07/04/2020 04:56 AM   TRIG 35 07/04/2020 04:56 AM   HDL 51 07/04/2020 04:56 AM   LDLCALC 112 (H) 07/04/2020 04:56 AM      Risk Assessment/Calculations:      Physical Exam:    VS:  BP (!) 79/40   Pulse 75   Ht 5\' 11"  (1.803 m)   Wt 179 lb 6.4 oz (81.4 kg)   SpO2 97%   BMI 25.02 kg/m     Wt Readings from Last 3 Encounters:  08/10/20 179 lb 6.4 oz (81.4 kg)  07/13/20 186 lb 15.2 oz (84.8 kg)     Constitutional:      Appearance: Healthy appearance. Not in distress.  Neck:     Vascular: No  JVR. JVD normal.  Pulmonary:     Effort: Pulmonary effort is normal.     Breath sounds: No wheezing. No rales.  Chest:     Comments: Median sternotomy well-healed without erythema or discharge Cardiovascular:     Normal rate. Regular rhythm. Normal S1. Normal S2.      Murmurs: There is no murmur.  Edema:    Peripheral edema absent.  Abdominal:     Palpations: Abdomen is soft. There is no hepatomegaly.  Skin:    General: Skin is warm and dry.  Neurological:     General: No focal deficit present.     Mental Status: Alert and oriented to person, place and time.     Cranial Nerves: Cranial nerves are intact.         ASSESSMENT & PLAN:  1. Coronary artery disease involving native coronary artery of native heart without angina pectoris Status post non-STEMI followed by CABG in 6/22.  He is progressing very well since surgery.  He has not had chest discomfort.  He is walking on a daily basis.  His blood pressure is running low and therefore, I will stop his metoprolol as noted below.  Continue aspirin, clopidogrel, atorvastatin.  He is not interested in cardiac rehabilitation.  He sees Dr. Kipp Brood later this month.    2. Chronic heart failure with preserved ejection fraction ([place]) He presented with volume excess.  His EF is normal.  He is currently NYHA II.  Volume status is normal.  He is not on diuretic therapy.  3. Thoracic aortic aneurysm without rupture (H[facility]) 4.4 cm by CT scan.  He will need follow-up CT in 1 year.  This will be arranged.  4. Aortic atherosclerosis ([facility]) Continue aspirin, statin therapy.  5.  Hypotension He is completely asymptomatic.  I repeated his blood pressure and it is 80/50.  He had difficulty with low blood pressures in the hospital.  Obtain CBC, BMET today.  I will also get a cortisol level.  I will have him come back in a week or 2 to recheck his blood pressure and also check his blood pressure machine at home.  6. Hyperlipidemia LDL goal  <70 Continue high intensity statin therapy.  Obtain lipids and LFTs in 3 months.  7. Chronic obstructive pulmonary disease, unspecified COPD type ([place]) 8. Tobacco abuse Follow-up with primary care.  He is trying to quit smoking.      Cardiac Rehabilitation Eligibility Assessment: The patient has declined or is not appropriate for Cardiac Rehab.        Dispo:  Return in about 3 months (around 11/10/2020) for Routine follow up in 3 months with Dr.Skains. .   Medication Adjustments/Labs and Tests Ordered: Current medicines are reviewed at length with the patient today.  Concerns regarding medicines are outlined above.  Tests Ordered: Orders Placed This Encounter  Procedures   CT ANGIO CHEST AORTA W/CM & OR WO/CM   Basic Metabolic Panel (BMET)   CBC   Cortisol   EKG 12-Lead   Medication Changes: Meds ordered this encounter  Medications   clopidogrel (PLAVIX) 75 MG tablet    Sig: Take 1 tablet (75 mg total) by mouth daily.    Dispense:  90 tablet    Refill:  3   atorvastatin (LIPITOR) 80 MG tablet    Sig: Take 1 tablet (80 mg total) by mouth at bedtime.    Dispense:  90 tablet    Refill:  3   nitroGLYCERIN (NITROSTAT) 0.4 MG SL tablet    Sig: Place 1 tablet (0.4 mg total) under the tongue every 5 (five) minutes as needed for chest pain.    Dispense:  25 tablet    Refill:  3    Signed, [name], PA-C  08/10/2020 5:49 PM    [facility] [address], [place], [place]  [address] Phone: [phone]; Fax: [phone]

## 2020-08-10 ENCOUNTER — Ambulatory Visit: Payer: Medicare Other | Admitting: Physician Assistant

## 2020-08-10 ENCOUNTER — Other Ambulatory Visit: Payer: Self-pay

## 2020-08-10 ENCOUNTER — Encounter: Payer: Self-pay | Admitting: Physician Assistant

## 2020-08-10 ENCOUNTER — Other Ambulatory Visit: Payer: Self-pay | Admitting: *Deleted

## 2020-08-10 VITALS — BP 79/40 | HR 75 | Ht 71.0 in | Wt 179.4 lb

## 2020-08-10 DIAGNOSIS — I2510 Atherosclerotic heart disease of native coronary artery without angina pectoris: Secondary | ICD-10-CM | POA: Diagnosis not present

## 2020-08-10 DIAGNOSIS — E785 Hyperlipidemia, unspecified: Secondary | ICD-10-CM | POA: Diagnosis not present

## 2020-08-10 DIAGNOSIS — J449 Chronic obstructive pulmonary disease, unspecified: Secondary | ICD-10-CM

## 2020-08-10 DIAGNOSIS — Z720 Tobacco use: Secondary | ICD-10-CM | POA: Diagnosis not present

## 2020-08-10 DIAGNOSIS — I5032 Chronic diastolic (congestive) heart failure: Secondary | ICD-10-CM | POA: Diagnosis not present

## 2020-08-10 DIAGNOSIS — I959 Hypotension, unspecified: Secondary | ICD-10-CM

## 2020-08-10 DIAGNOSIS — I712 Thoracic aortic aneurysm, without rupture, unspecified: Secondary | ICD-10-CM

## 2020-08-10 DIAGNOSIS — I700 Atherosclerosis of aorta: Secondary | ICD-10-CM | POA: Diagnosis not present

## 2020-08-10 MED ORDER — CLOPIDOGREL BISULFATE 75 MG PO TABS: 75.0000 mg | ORAL_TABLET | Freq: Every day | ORAL | 3 refills | Status: DC

## 2020-08-10 MED ORDER — ATORVASTATIN CALCIUM 80 MG PO TABS
80.0000 mg | ORAL_TABLET | Freq: Every day | ORAL | 3 refills | Status: DC
Start: 2020-08-10 — End: 2020-09-05

## 2020-08-10 MED ORDER — NITROGLYCERIN 0.4 MG SL SUBL: 0.4000 mg | SUBLINGUAL_TABLET | SUBLINGUAL | 3 refills | Status: DC | PRN

## 2020-08-10 NOTE — Patient Instructions (Signed)
Medication Instructions:   DISCONTINUE METOPROLOL  *If you need a refill on your cardiac medications before your next appointment, please call your pharmacy*   Lab Work:  TODAY!!!!  BMET/CBC/CORTISOL  Your physician recommends that you return for a FASTING lipid profile: on day of office visit on Thursday, November 3 from midnight the night before.    If you have labs (blood work) drawn today and your tests are completely normal, you will receive your results only by: [place] (if you have MyChart) OR A paper copy in the mail If you have any lab test that is abnormal or we need to change your treatment, we will call you to review the results.   Follow-Up: At [facility], you and your health needs are our priority.  As part of our continuing mission to provide you with exceptional heart care, we have created designated Provider Care Teams.  These Care Teams include your primary Cardiologist (physician) and Advanced Practice Providers (APPs -  Physician Assistants and Nurse Practitioners) who all work together to provide you with the care you need, when you need it.  We recommend signing up for the patient portal called "MyChart".  Sign up information is provided on this After Visit Summary.  MyChart is used to connect with patients for Virtual Visits (Telemedicine).  Patients are able to view lab/test results, encounter notes, upcoming appointments, etc.  Non-urgent messages can be sent to your provider as well.   To learn more about what you can do with MyChart, go to [email or web address].    Your next appointment:   3 month(s) with Dr.Skains on Thursday, November 3 @ 11:00.  You will come fasting that day from 12 midnight the night before.   The format for your next appointment:   In Person  Provider:   Candee Furbish, MD   Other Instructions You have been referred to the Pharm-D on Tuesday, July 19 @ 1:30 pm.   Non-Cardiac CT Angiography (CTA), IN MAY OF 2023. is a  special type of CT scan that uses a computer to produce multi-dimensional views of major blood vessels throughout the body. In CT angiography, a contrast material is injected through an IV to help visualize the blood vessels

## 2020-08-10 NOTE — Telephone Encounter (Signed)
Ok to fill x 1 month. Then will need to get PCP to fill in future. [name], PA-C    08/10/2020 5:12 PM

## 2020-08-11 ENCOUNTER — Emergency Department (HOSPITAL_COMMUNITY)
Admission: EM | Admit: 2020-08-11 | Discharge: 2020-08-11 | Disposition: A | Discharge status: Home / Self Care | Payer: Medicare Other | Attending: Emergency Medicine | Admitting: Emergency Medicine

## 2020-08-11 ENCOUNTER — Encounter: Payer: Self-pay | Admitting: Physician Assistant

## 2020-08-11 ENCOUNTER — Emergency Department (HOSPITAL_COMMUNITY): Payer: Medicare Other

## 2020-08-11 ENCOUNTER — Telehealth: Payer: Self-pay | Admitting: *Deleted

## 2020-08-11 ENCOUNTER — Other Ambulatory Visit: Payer: Self-pay | Admitting: *Deleted

## 2020-08-11 DIAGNOSIS — Z8546 Personal history of malignant neoplasm of prostate: Secondary | ICD-10-CM | POA: Diagnosis not present

## 2020-08-11 DIAGNOSIS — Z79899 Other long term (current) drug therapy: Secondary | ICD-10-CM | POA: Diagnosis not present

## 2020-08-11 DIAGNOSIS — J449 Chronic obstructive pulmonary disease, unspecified: Secondary | ICD-10-CM | POA: Insufficient documentation

## 2020-08-11 DIAGNOSIS — N3000 Acute cystitis without hematuria: Secondary | ICD-10-CM

## 2020-08-11 DIAGNOSIS — I110 Hypertensive heart disease with heart failure: Secondary | ICD-10-CM | POA: Diagnosis not present

## 2020-08-11 DIAGNOSIS — D72829 Elevated white blood cell count, unspecified: Secondary | ICD-10-CM | POA: Diagnosis not present

## 2020-08-11 DIAGNOSIS — R0789 Other chest pain: Secondary | ICD-10-CM | POA: Insufficient documentation

## 2020-08-11 DIAGNOSIS — F17200 Nicotine dependence, unspecified, uncomplicated: Secondary | ICD-10-CM | POA: Diagnosis not present

## 2020-08-11 DIAGNOSIS — I5031 Acute diastolic (congestive) heart failure: Secondary | ICD-10-CM | POA: Insufficient documentation

## 2020-08-11 DIAGNOSIS — J439 Emphysema, unspecified: Secondary | ICD-10-CM | POA: Diagnosis not present

## 2020-08-11 DIAGNOSIS — I2510 Atherosclerotic heart disease of native coronary artery without angina pectoris: Secondary | ICD-10-CM | POA: Insufficient documentation

## 2020-08-11 DIAGNOSIS — Z951 Presence of aortocoronary bypass graft: Secondary | ICD-10-CM | POA: Insufficient documentation

## 2020-08-11 DIAGNOSIS — R918 Other nonspecific abnormal finding of lung field: Secondary | ICD-10-CM | POA: Diagnosis not present

## 2020-08-11 DIAGNOSIS — R6883 Chills (without fever): Secondary | ICD-10-CM | POA: Diagnosis not present

## 2020-08-11 DIAGNOSIS — J90 Pleural effusion, not elsewhere classified: Secondary | ICD-10-CM | POA: Diagnosis not present

## 2020-08-11 DIAGNOSIS — Z7982 Long term (current) use of aspirin: Secondary | ICD-10-CM | POA: Insufficient documentation

## 2020-08-11 DIAGNOSIS — R61 Generalized hyperhidrosis: Secondary | ICD-10-CM | POA: Diagnosis not present

## 2020-08-11 DIAGNOSIS — R509 Fever, unspecified: Secondary | ICD-10-CM | POA: Diagnosis not present

## 2020-08-11 DIAGNOSIS — I10 Essential (primary) hypertension: Secondary | ICD-10-CM | POA: Diagnosis not present

## 2020-08-11 DIAGNOSIS — R911 Solitary pulmonary nodule: Secondary | ICD-10-CM

## 2020-08-11 HISTORY — DX: Malignant neoplasm of prostate: C61

## 2020-08-11 LAB — CBC
Hematocrit: 32.1 % — ABNORMAL LOW (ref 37.5–51.0)
Hemoglobin: 10.2 g/dL — ABNORMAL LOW (ref 13.0–17.7)
MCH: 26.6 pg (ref 26.6–33.0)
MCHC: 31.8 g/dL (ref 31.5–35.7)
MCV: 84 fL (ref 79–97)
Platelets: 348 10*3/uL (ref 150–450)
RBC: 3.84 x10E6/uL — ABNORMAL LOW (ref 4.14–5.80)
RDW: 14.2 % (ref 11.6–15.4)
WBC: 21 10*3/uL (ref 3.4–10.8)

## 2020-08-11 LAB — BASIC METABOLIC PANEL
BUN/Creatinine Ratio: 17 (ref 10–24)
BUN: 18 mg/dL (ref 8–27)
CO2: 26 mmol/L (ref 20–29)
Calcium: 8.9 mg/dL (ref 8.6–10.2)
Chloride: 98 mmol/L (ref 96–106)
Creatinine, Ser: 1.09 mg/dL (ref 0.76–1.27)
Glucose: 116 mg/dL — ABNORMAL HIGH (ref 65–99)
Potassium: 4.5 mmol/L (ref 3.5–5.2)
Sodium: 137 mmol/L (ref 134–144)
eGFR: 72 mL/min/{1.73_m2} (ref >59)

## 2020-08-11 LAB — COMPREHENSIVE METABOLIC PANEL
ALT: 23 U/L (ref 0–44)
AST: 39 U/L (ref 15–41)
Albumin: 3.3 g/dL — ABNORMAL LOW (ref 3.5–5.0)
Alkaline Phosphatase: 210 U/L — ABNORMAL HIGH (ref 38–126)
Anion gap: 10 (ref 5–15)
BUN: 18 mg/dL (ref 8–23)
CO2: 27 mmol/L (ref 22–32)
Calcium: 9.1 mg/dL (ref 8.9–10.3)
Chloride: 102 mmol/L (ref 98–111)
Creatinine, Ser: 1.06 mg/dL (ref 0.61–1.24)
GFR, Estimated: >60 mL/min (ref >60)
Glucose, Bld: 114 mg/dL — ABNORMAL HIGH (ref 70–99)
Potassium: 4.5 mmol/L (ref 3.5–5.1)
Sodium: 139 mmol/L (ref 135–145)
Total Bilirubin: 0.8 mg/dL (ref 0.3–1.2)
Total Protein: 7.4 g/dL (ref 6.5–8.1)

## 2020-08-11 LAB — CBC WITH DIFFERENTIAL/PLATELET
Abs Immature Granulocytes: 0.05 10*3/uL (ref 0.00–0.07)
Basophils Absolute: 0.1 10*3/uL (ref 0.0–0.1)
Basophils Relative: 0 %
Eosinophils Absolute: 0.2 10*3/uL (ref 0.0–0.5)
Eosinophils Relative: 1 %
HCT: 34.9 % — ABNORMAL LOW (ref 39.0–52.0)
Hemoglobin: 10.6 g/dL — ABNORMAL LOW (ref 13.0–17.0)
Immature Granulocytes: 0 %
Lymphocytes Relative: 17 %
Lymphs Abs: 2 10*3/uL (ref 0.7–4.0)
MCH: 26.6 pg (ref 26.0–34.0)
MCHC: 30.4 g/dL (ref 30.0–36.0)
MCV: 87.5 fL (ref 80.0–100.0)
Monocytes Absolute: 1.2 10*3/uL — ABNORMAL HIGH (ref 0.1–1.0)
Monocytes Relative: 10 %
Neutro Abs: 8.3 10*3/uL — ABNORMAL HIGH (ref 1.7–7.7)
Neutrophils Relative %: 72 %
Platelets: 314 10*3/uL (ref 150–400)
RBC: 3.99 MIL/uL — ABNORMAL LOW (ref 4.22–5.81)
RDW: 14.9 % (ref 11.5–15.5)
WBC: 11.7 10*3/uL — ABNORMAL HIGH (ref 4.0–10.5)
nRBC: 0 % (ref 0.0–0.2)

## 2020-08-11 LAB — URINALYSIS, ROUTINE W REFLEX MICROSCOPIC
Bilirubin Urine: NEGATIVE
Glucose, UA: NEGATIVE mg/dL
Ketones, ur: NEGATIVE mg/dL
Nitrite: NEGATIVE
Protein, ur: NEGATIVE mg/dL
Specific Gravity, Urine: 1.016 (ref 1.005–1.030)
WBC, UA: >50 WBC/hpf — ABNORMAL HIGH (ref 0–5)
pH: 6 (ref 5.0–8.0)

## 2020-08-11 LAB — LACTIC ACID, PLASMA
Lactic Acid, Venous: 2 mmol/L (ref 0.5–1.9)
Lactic Acid, Venous: 1.5 mmol/L (ref 0.5–1.9)

## 2020-08-11 LAB — CORTISOL: Cortisol: 15.6 ug/dL

## 2020-08-11 MED ORDER — SODIUM CHLORIDE 0.9 % IV BOLUS
500.0000 mL | Freq: Once | INTRAVENOUS | Status: AC
  Administered 2020-08-11: 500 mL via INTRAVENOUS

## 2020-08-11 MED ORDER — PANTOPRAZOLE SODIUM 40 MG PO TBEC: 40.0000 mg | DELAYED_RELEASE_TABLET | Freq: Every day | ORAL | 1 refills | Status: DC

## 2020-08-11 MED ORDER — IOHEXOL 300 MG/ML  SOLN
75.0000 mL | Freq: Once | INTRAMUSCULAR | Status: AC | PRN
  Administered 2020-08-11: 75 mL via INTRAVENOUS

## 2020-08-11 MED ORDER — SODIUM CHLORIDE 0.9 % IV SOLN
1.0000 g | Freq: Once | INTRAVENOUS | Status: AC
  Administered 2020-08-11: 1 g via INTRAVENOUS
  Filled 2020-08-11: qty 10

## 2020-08-11 MED ORDER — CEPHALEXIN 500 MG PO CAPS: 500.0000 mg | ORAL_CAPSULE | Freq: Three times a day (TID) | ORAL | 0 refills | Status: AC

## 2020-08-11 NOTE — Discharge Instructions (Addendum)
Take the abx as prescribed, follow up with your doctor to be rechecked, return for fever, worsening symptoms  Follow up with your primary care doctor to schedule a routine follow up CT scan for the long nodule noted on CT scan (6-12 months from now)

## 2020-08-11 NOTE — ED Triage Notes (Signed)
Pt here from home  with c/o not feeling good chills , no fever , had a high white count and was told to come to the ED

## 2020-08-11 NOTE — ED Provider Notes (Signed)
[facility] Provider Note   CSN: [id number] Arrival date & time: 08/11/20  8299     History No chief complaint on file.   [facility] is a 75 y.o. male.  The history is provided by the patient and medical records.  [facility] is a 75 y.o. male who presents to the Emergency Department complaining of referred by doctor for high wbc.  He states he is here because his doctor told him to due to elevated wbc.  Tuesday went on a walk and came home with chills and fatigue.  Had open heart surgery one month ago.    No chest pain, sob, abdominal pain, N/V/D, dysuria.      Past Medical History:  Diagnosis Date   Aortic atherosclerosis ([place])    CT 06/2020   CAD (coronary artery disease)    NSTEMI S/p CABG 6/22 (L-LAD, S-LPDA/OM2 - Dr. Kipp Brood)   COPD (chronic obstructive pulmonary disease) ([place])    Hypercholesteremia    Hypertension    Prostate cancer [facility])    s/p resection   Thoracic aortic aneurysm ([place])    CT 5/22: 4.4 cm >> rpt in 1 year    Patient Active Problem List   Diagnosis Date Noted   Thoracic aortic aneurysm ([place]) 08/09/2020   Aortic atherosclerosis ([place]) 08/09/2020   S/P CABG x 3 07/08/2020   CAD (coronary artery disease) 07/05/2020   Acute respiratory failure with hypoxia ([place]) 37/16/9678   Acute diastolic heart failure ([place]) 07/05/2020   NSTEMI (non-ST elevated myocardial infarction) ([place]) 07/04/2020   Essential hypertension 07/04/2020   COPD (chronic obstructive pulmonary disease) ([place]) 07/04/2020   Hyperlipidemia LDL goal <70 07/04/2020   Elevated d-dimer 07/04/2020   Leukocytosis 07/04/2020   Prolonged QT interval 07/04/2020   CAP (community acquired pneumonia) 07/04/2020   Altered mental status 07/07/2013   Left-sided weakness 07/07/2013    Past Surgical History:  Procedure Laterality Date   ABDOMINAL SURGERY     "repair bleeding ulcers"   CORONARY ARTERY BYPASS GRAFT N/A 07/08/2020   Procedure:  CORONARY ARTERY BYPASS GRAFTING (CABG) X  THREE ,ON PUMP, USING LEFT INTERNAL MAMMARY ARTERY AND RIGHT ENDOSPSCOPIC GREAT SAPHENOUS VEIN HARVEST CONDUITS;  Surgeon: Lajuana Matte, MD;  Location: [facility];  Service: Open Heart Surgery;  Laterality: N/A;   LEFT HEART CATH AND CORONARY ANGIOGRAPHY N/A 07/06/2020   Procedure: LEFT HEART CATH AND CORONARY ANGIOGRAPHY;  Surgeon: Jettie Booze, MD;  Location: [facility];  Service: Cardiovascular;  Laterality: N/A;   NECK SURGERY     ROTATOR CUFF REPAIR     TEE WITHOUT CARDIOVERSION N/A 07/08/2020   Procedure: TRANSESOPHAGEAL ECHOCARDIOGRAM (TEE);  Surgeon: Lajuana Matte, MD;  Location: [facility];  Service: Open Heart Surgery;  Laterality: N/A;       No family history on file.  Social History   Tobacco Use   Smoking status: Every Day    Pack years: 0.00   Smokeless tobacco: Never  Substance Use Topics   Alcohol use: No   Drug use: No    Home Medications Prior to Admission medications   Medication Sig Start Date End Date Taking? Authorizing Provider  albuterol (PROVENTIL HFA;VENTOLIN HFA) 108 (90 BASE) MCG/ACT inhaler Inhale 1-2 puffs into the lungs every 6 (six) hours as needed for wheezing or shortness of breath.   Yes [provider]  aspirin 81 MG EC tablet Take 1 tablet (81 mg total) by mouth daily. 07/13/20  Yes  Gold, Wayne E, PA-C  atorvastatin (LIPITOR) 80 MG tablet Take 1 tablet (80 mg total) by mouth at bedtime. 08/10/20 11/08/20 Yes Weaver, Scott T, PA-C  clopidogrel (PLAVIX) 75 MG tablet Take 1 tablet (75 mg total) by mouth daily. 08/10/20 08/10/21 Yes Weaver, Scott T, PA-C  nitroGLYCERIN (NITROSTAT) 0.4 MG SL tablet Place 1 tablet (0.4 mg total) under the tongue every 5 (five) minutes as needed for chest pain. 08/10/20 11/08/20 Yes Weaver, Scott T, PA-C  pantoprazole (PROTONIX) 40 MG tablet Take 1 tablet (40 mg total) by mouth daily. 08/11/20  Yes Weaver, Scott T, PA-C  tamsulosin (FLOMAX) 0.4 MG CAPS capsule Take  0.4 mg by mouth daily. 10/06/19  Yes [provider]    Allergies    Oxycodone and Pravastatin  Review of Systems   Review of Systems  All other systems reviewed and are negative.  Physical Exam Updated Vital Signs BP 123/76   Pulse 89   Temp 97.7 F (36.5 C) (Oral)   Resp (!) 27   SpO2 100%   Physical Exam Vitals and nursing note reviewed.  Constitutional:      Appearance: He is well-developed.  HENT:     Head: Normocephalic and atraumatic.  Cardiovascular:     Rate and Rhythm: Normal rate and regular rhythm.     Heart sounds: No murmur heard. Pulmonary:     Effort: Pulmonary effort is normal. No respiratory distress.     Breath sounds: Normal breath sounds.     Comments: Healing sternotomy surgical site.  Mild tenderness to palpation over left chest wall without erythema Abdominal:     Palpations: Abdomen is soft.     Tenderness: There is no abdominal tenderness. There is no guarding or rebound.  Musculoskeletal:        General: No swelling or tenderness.  Skin:    General: Skin is warm and dry.  Neurological:     Mental Status: He is alert and oriented to person, place, and time.  Psychiatric:        Behavior: Behavior normal.    ED Results / Procedures / Treatments   Labs (all labs ordered are listed, but only abnormal results are displayed) Labs Reviewed  COMPREHENSIVE METABOLIC PANEL - Abnormal; Notable for the following components:      Result Value   Glucose, Bld 114 (*)    Albumin 3.3 (*)    Alkaline Phosphatase 210 (*)    All other components within normal limits  CBC WITH DIFFERENTIAL/PLATELET - Abnormal; Notable for the following components:   WBC 11.7 (*)    RBC 3.99 (*)    Hemoglobin 10.6 (*)    HCT 34.9 (*)    Neutro Abs 8.3 (*)    Monocytes Absolute 1.2 (*)    All other components within normal limits  URINALYSIS, ROUTINE W REFLEX MICROSCOPIC - Abnormal; Notable for the following components:   APPearance CLOUDY (*)    Hgb urine  dipstick SMALL (*)    Leukocytes,Ua LARGE (*)    WBC, UA >50 (*)    Bacteria, UA MANY (*)    All other components within normal limits  LACTIC ACID, PLASMA - Abnormal; Notable for the following components:   Lactic Acid, Venous 2.0 (*)    All other components within normal limits  URINE CULTURE  CULTURE, BLOOD (ROUTINE X 2)  CULTURE, BLOOD (ROUTINE X 2)  LACTIC ACID, PLASMA    EKG EKG Interpretation  Date/Time:  Wednesday August 11 2020 10:30:56 EDT Ventricular  Rate:  85 PR Interval:  140 QRS Duration: 84 QT Interval:  352 QTC Calculation: 418 R Axis:   88 Text Interpretation: Normal sinus rhythm ST & T wave abnormality, consider inferior ischemia Abnormal ECG Confirmed by Quintella Reichert [phone]) on 08/11/2020 12:06:06 PM  Radiology DG Chest 2 View  Result Date: 08/11/2020 CLINICAL DATA:  Fevers and chills. EXAM: CHEST - 2 VIEW COMPARISON:  07/10/2020.  07/09/2020.  CT 07/04/2020. FINDINGS: EKG leads noted over the chest. Surgical clips noted over the lower chest. Prior CABG. Heart size normal. Interval clearing of bilateral pulmonary infiltrates/edema. Nodular density noted over the right apex. Given interval clearing of pulmonary infiltrates/edema repeat nonenhanced chest CT suggested to exclude a right apical pulmonary nodule. Tiny right pleural effusion cannot be excluded on lateral view. No pneumothorax. No acute bony abnormality identified. Postsurgical changes right shoulder. IMPRESSION: 1.  Prior CABG.  Heart size normal. 2. Interval clearing of bilateral pulmonary infiltrates/edema. Nodular density noted over the right apex. Given interval clearing of pulmonary infiltrates/edema repeat nonenhanced chest CT suggested to exclude a right apical pulmonary nodule. Electronically Signed   By: Marcello Moores  Register   On: 08/11/2020 11:37   CT Chest Wo Contrast  Result Date: 08/11/2020 CLINICAL DATA:  Fever of unknown origin. EXAM: CT CHEST WITHOUT CONTRAST TECHNIQUE: Multidetector CT imaging  of the chest was performed following the standard protocol without IV contrast. COMPARISON:  CT angio chest 07/04/2020 FINDINGS: Cardiovascular: Interval CABG procedure since 07/04/2020. Mild cardiac enlargement. Aortic atherosclerosis. Postop change noted within the anterior mediastinum including small fluid collection which measures approximately 2.8 x 2.0 by 2.3 cm, image 47/3. Nonspecific. Mediastinum/Nodes: Normal appearance of the thyroid gland. The trachea appears patent and is midline. Normal appearance of the esophagus. No adenopathy identified within the chest. Lungs/Pleura: Trace right pleural effusion. Centrilobular and paraseptal emphysema. No atelectasis, airspace consolidation, or pneumothorax. Small nodule versus scar noted within the lateral right apex measuring 9 mm, image 22/4. Upper Abdomen: No acute abnormality. Musculoskeletal: Status post median sternotomy. Sternotomy defect remains distinct. Cerclage wires appear intact. Mild peristernal soft tissue stranding is likely postoperative. Degenerative disc disease identified within the lower thoracic spine. IMPRESSION: 1. Interval CABG procedure since 07/04/2020. 2. Small fluid collection within the anterior mediastinum is likely postoperative in etiology. This is technically incompletely characterized without IV contrast material. If there is a clinical concern for infection consider repeat imaging with IV contrast material. 3. Emphysema and aortic atherosclerosis. 4. 9 mm right upper lobe lung nodule versus scar. Recommend follow-up imaging in 3-6 months to ensure stability of this density. Aortic Atherosclerosis (ICD10-I70.0) and Emphysema (ICD10-J43.9). Electronically Signed   By: Kerby Moors M.D.   On: 08/11/2020 13:57    Procedures Procedures   Medications Ordered in ED Medications  cefTRIAXone (ROCEPHIN) 1 g in sodium chloride 0.9 % 100 mL IVPB (1 g Intravenous New Bag/Given 08/11/20 1330)  sodium chloride 0.9 % bolus 500 mL (500  mLs Intravenous Bolus 08/11/20 1330)    ED Course  I have reviewed the triage vital signs and the nursing notes.  Pertinent labs & imaging results that were available during my care of the patient were reviewed by me and considered in my medical decision making (see chart for details).    MDM Rules/Calculators/A&P                         patient referred to the emergency department for leukocytosis. Patient reports having chills, episodes of diaphoresis at  home and some fatigue. He denies any additional symptoms. He does have some left-sided chest wall pain. He denies any chest pain aside from palpation. CBC today improved when compared to yesterday's. Lactic acid is only minimally elevated. Given his chest wall pain a CT chest was obtained, CT with indeterminate fluid collection. Will get repeat CT with contrast to further evaluate. UA is consistent with UTI and he was started on antibiotics. Patient care transferred pending CT scan.  Final Clinical Impression(s) / ED Diagnoses Final diagnoses:  None    Rx / DC Orders ED Discharge Orders     None        Quintella Reichert, MD 08/11/20 1535

## 2020-08-11 NOTE — ED Provider Notes (Signed)
Emergency Medicine Provider Triage Evaluation Note  [facility] , a 75 y.o. male  was evaluated in triage.  Pt complains of fatigue, night sweats and chills x 1 week. Was called by cardiologist and told to seek ER evaluation for elevated WBC.   Had left heart cath 07/06/20. Cabg x 3 07/08/2020.   H/O prostate cancer, in remission.   Note from Cardiology:  S/w both pt and son per ( DPR). Pt has been having chills and has been in the bed for several days.  [name], PA-C has talked with both son and pt and pt did not mention yesterday at ov that pt has prostate cancer.  Called son back to lvm that pt needs to go to the ER due to WBC being elevated on lab work yesterday. Pt is aware needs to go to ER.  Review of Systems  Positive: Fatigue, night sweats, chills Negative: Vomiting, nausea, chest pain  Physical Exam  There were no vitals taken for this visit. Gen:   Awake, no distress   Resp:  Normal effort  MSK:   Moves extremities without difficulty  Other:    Medical Decision Making  Medically screening exam initiated at 10:21 AM.  Appropriate orders placed.  [facility] Portal was informed that the remainder of the evaluation will be completed by another provider, this initial triage assessment does not replace that evaluation, and the importance of remaining in the ED until their evaluation is complete.     Sherrill Raring, PA-C 08/11/20 1031    Charlesetta Shanks, MD 08/12/20 [phone]

## 2020-08-11 NOTE — Telephone Encounter (Signed)
S/w both pt and son per ( DPR). Pt has been having chills and has been in the bed for several days.  [name], PA-C has talked with both son and pt and pt did not mention yesterday at ov that pt has prostate cancer.  Called son back to lvm that pt needs to go to the ER due to WBC being elevated on lab work yesterday. Pt is aware needs to go to ER.

## 2020-08-15 LAB — URINE CULTURE: Culture: >=100000 — AB

## 2020-08-16 ENCOUNTER — Telehealth: Payer: Self-pay | Admitting: *Deleted

## 2020-08-16 ENCOUNTER — Other Ambulatory Visit (HOSPITAL_BASED_OUTPATIENT_CLINIC_OR_DEPARTMENT_OTHER): Payer: Self-pay | Admitting: *Deleted

## 2020-08-16 DIAGNOSIS — I712 Thoracic aortic aneurysm, without rupture, unspecified: Secondary | ICD-10-CM

## 2020-08-16 LAB — CULTURE, BLOOD (ROUTINE X 2)
Culture: NO GROWTH
Culture: NO GROWTH

## 2020-08-16 NOTE — Telephone Encounter (Signed)
Post ED Visit - Positive Culture Follow-up  Culture report reviewed by antimicrobial stewardship pharmacist: [facility] Team []  Elenor Quinones, Pharm.D. []  Heide Guile, Pharm.D., BCPS AQ-ID []  Parks Neptune, Pharm.D., BCPS []  Alycia Rossetti, Pharm.D., BCPS []  [place], Pharm.D., BCPS, AAHIVP []  Legrand Como, Pharm.D., BCPS, AAHIVP []  Salome Arnt, PharmD, BCPS []  Johnnette Gourd, PharmD, BCPS []  Hughes Better, PharmD, BCPS []  Leeroy Cha, PharmD []  Laqueta Linden, PharmD, BCPS []  Albertina Parr, PharmD  [facility] Team []  Leodis Sias, PharmD []  Lindell Spar, PharmD []  Royetta Asal, PharmD []  Graylin Shiver, Rph []  Rema Fendt) Glennon Mac, PharmD []  Arlyn Dunning, PharmD []  Netta Cedars, PharmD []  Dia Sitter, PharmD []  Leone Haven, PharmD []  Gretta Arab, PharmD []  Theodis Shove, PharmD []  Peggyann Juba, PharmD []  Reuel Boom, PharmD   Positive urine culture Treated with Cephalexin, organism sensitive to the same and no further patient follow-up is required at this time.  Joetta Manners, PharmD  Harlon Flor Talley 08/16/2020, 10:35 AM

## 2020-08-16 NOTE — Telephone Encounter (Signed)
From my office note on 08/10/20: 3. Thoracic aortic aneurysm without rupture (HCC) 4.4 cm by CT scan.  He will need follow-up CT in 1 year.  This will be arranged.  CT should be scheduled ONE YEAR from now in July 2023.  Please cancel CT scheduled for August 24, 2020 and make sure he has a CTA scheduled in July 2023.  Thanks, [name], PA-C    08/16/2020 9:50 AM

## 2020-08-19 ENCOUNTER — Other Ambulatory Visit: Payer: Self-pay | Admitting: Thoracic Surgery (Cardiothoracic Vascular Surgery)

## 2020-08-19 DIAGNOSIS — Z951 Presence of aortocoronary bypass graft: Secondary | ICD-10-CM

## 2020-08-20 ENCOUNTER — Ambulatory Visit
Admission: RE | Admit: 2020-08-20 | Discharge: 2020-08-20 | Disposition: A | Discharge status: Home / Self Care | Payer: Medicare Other | Source: Ambulatory Visit | Attending: Thoracic Surgery (Cardiothoracic Vascular Surgery) | Admitting: Thoracic Surgery (Cardiothoracic Vascular Surgery)

## 2020-08-20 ENCOUNTER — Other Ambulatory Visit: Payer: Self-pay

## 2020-08-20 ENCOUNTER — Ambulatory Visit (INDEPENDENT_AMBULATORY_CARE_PROVIDER_SITE_OTHER): Payer: Self-pay | Admitting: Surgical

## 2020-08-20 ENCOUNTER — Ambulatory Visit: Payer: Self-pay | Admitting: Thoracic Surgery (Cardiothoracic Vascular Surgery)

## 2020-08-20 VITALS — BP 119/80 | HR 100 | Resp 20 | Ht 71.0 in | Wt 183.0 lb

## 2020-08-20 DIAGNOSIS — I700 Atherosclerosis of aorta: Secondary | ICD-10-CM | POA: Diagnosis not present

## 2020-08-20 DIAGNOSIS — Z951 Presence of aortocoronary bypass graft: Secondary | ICD-10-CM | POA: Diagnosis not present

## 2020-08-20 DIAGNOSIS — R911 Solitary pulmonary nodule: Secondary | ICD-10-CM | POA: Diagnosis not present

## 2020-08-20 NOTE — Patient Instructions (Signed)
Discussed routine activity progression and follow-up.

## 2020-08-20 NOTE — Progress Notes (Signed)
[address]       [place] [address]             [phone]      [facility] [place] Medical Record #[id number] Date of Birth: [birth year]  Referring: Jettie Booze, MD Primary Care: Serita Grammes, MD Primary Cardiologist: Candee Furbish, MD   Chief Complaint:   POST OP FOLLOW UP  07/08/2020 Patient:  [facility] Pre-Op Dx: NSTEMI                         2V CAD                         HTN                         Hyperlipidemia   Post-op Dx:  Same Procedure: CABG X 3, LIMA LAD, RSVG to LPDA, OM2   Endoscopic greater saphenous vein harvest on the right     Surgeon and Role:      * Lightfoot, Lucile Crater, MD - Primary    * T. Harriet Pho, PA-C - assisting   History of Present Illness:    The patient is a 75 year old male status post the above described procedure seen in the office on today's date routine post surgical follow-up.  He reports that he feels well.  He takes an occasional Tylenol for pain.  He denies fevers, chills or other significant constitutional symptoms.  He has had no difficulties with his incisions.  He denies chest pain, shortness of breath or palpitations.  He has not having any lower extremity edema.  Overall he is quite pleased with his progress and generally feels well.  He is ambulating up to a mile at a time.      Past Medical History:  Diagnosis Date   Aortic atherosclerosis ([place])    CT 06/2020   CAD (coronary artery disease)    NSTEMI S/p CABG 6/22 (L-LAD, S-LPDA/OM2 - Dr. Kipp Brood)   COPD (chronic obstructive pulmonary disease) ([place])    Hypercholesteremia    Hypertension    Prostate cancer [facility])    s/p resection   Thoracic aortic aneurysm ([place])    CT 5/22: 4.4 cm >> rpt in 1 year     Social History   Tobacco Use  Smoking Status Every Day  Smokeless Tobacco Never    Social History   Substance and Sexual Activity  Alcohol Use No     Allergies  Allergen Reactions   Oxycodone Nausea And  Vomiting   Pravastatin Itching    Other reaction(s): Xerostomia, Itching    Current Outpatient Medications  Medication Sig Dispense Refill   albuterol (PROVENTIL HFA;VENTOLIN HFA) 108 (90 BASE) MCG/ACT inhaler Inhale 1-2 puffs into the lungs every 6 (six) hours as needed for wheezing or shortness of breath.     aspirin 81 MG EC tablet Take 1 tablet (81 mg total) by mouth daily.     atorvastatin (LIPITOR) 80 MG tablet Take 1 tablet (80 mg total) by mouth at bedtime. 90 tablet 3   clopidogrel (PLAVIX) 75 MG tablet Take 1 tablet (75 mg total) by mouth daily. 90 tablet 3   nitroGLYCERIN (NITROSTAT) 0.4 MG SL tablet Place 1 tablet (0.4 mg total) under the tongue every 5 (five) minutes as needed for chest pain. 25 tablet 3   pantoprazole (PROTONIX) 40 MG  tablet Take 1 tablet (40 mg total) by mouth daily. 30 tablet 1   tamsulosin (FLOMAX) 0.4 MG CAPS capsule Take 0.4 mg by mouth daily.     No current facility-administered medications for this visit.       Physical Exam: BP 119/80   Pulse 100   Resp 20   Ht 5\' 11"  (1.803 m)   Wt 183 lb (83 kg)   SpO2 96% Comment: RA  BMI 25.52 kg/m   General appearance: alert, cooperative, and no distress Heart: regular rate and rhythm Lungs: clear to auscultation bilaterally Abdomen: benign Extremities: no edema Wound: incis healing well   Diagnostic Studies & Laboratory data:     Recent Radiology Findings:   No results found.    Recent Lab Findings: Lab Results  Component Value Date   WBC 11.7 (H) 08/11/2020   HGB 10.6 (L) 08/11/2020   HCT 34.9 (L) 08/11/2020   PLT 314 08/11/2020   GLUCOSE 114 (H) 08/11/2020   CHOL 170 07/04/2020   TRIG 35 07/04/2020   HDL 51 07/04/2020   LDLCALC 112 (H) 07/04/2020   ALT 23 08/11/2020   AST 39 08/11/2020   NA 139 08/11/2020   K 4.5 08/11/2020   CL 102 08/11/2020   CREATININE 1.06 08/11/2020   BUN 18 08/11/2020   CO2 27 08/11/2020   INR 1.3 (H) 07/08/2020   HGBA1C 5.9 (H) 07/05/2020       Assessment / Plan: The patient is doing very well status post CABG.  There are no notable postoperative difficulties at this time.  I did not make any changes to his current medication regimen.  He will continue to follow-up with cardiology.  He is cleared to start cardiac rehab at any time.  He will continue to follow-up with cardiology for his long-term cardiac care.  I reviewed the patient's chest x-ray from today and it shows no significant concerning findings.  There is specifically no significant infiltrates or effusions.  We will see again on a as needed basis for any surgical related needs or at request.      Medication Changes: No orders of the defined types were placed in this encounter.     [name], PA-C  08/20/2020 1:53 PM

## 2020-08-22 NOTE — Progress Notes (Signed)
Patient ID: GOEBEL HELLUMS                 DOB: [birth year]                      MRN: [id number]     HPI: Blake Sutton is a 75 y.o. male patient of Dr. Marlou Porch referred by [name], PA-C to HTN clinic. PMH is significant for NSTEMI s/p CABG 07/08/20 (L-LAD, S-LPDA/OM2), HFpEF (EF 55-60% 07/08/20), thoracic aortic aneurysm (4.4 cm on chest CTA 07/04/20), aortic atherosclerosis, HTN, HLD, COPD. Carotid US 07/07/20 showeded 1-39% stensosis of right and left carotid.   Last seen by [name], PA-C on 08/10/20 and BP was 79/40 (80/50 on repeat). Checked cortisol and was normal. On lab work, WBC was 21 and when called with results, patient reported chills, sweats, and dysuria. Concerned for underlying infection and possible sepsis given hypotension. Found to have UTI at [facility] ED, with cultured organisms treated appropriately with cephalexin. At 08/20/20 visit with TCTS, BP was 119/80, HR 100.   Today, patient presents in good spirits and brings his home blood pressure machine with him to check it today. He has stopped metoprolol tartrate as instructed after he was hypotensive. Reports using other medications for blood pressure in the past but he is unaware of the names. He uses a pill box to organize his medications and has been taking his other medications as prescribed. Reports that he is doing well now that his infection has been treated. He checks his blood pressure a few times throughout the day and readings range from 117/75 to 161/93, with an average of 134/85. His heart rates at home have been 80s-100s. Using his home cuff in clinic, BP was initially 156/108. Clinic cuff reading was 132/88. Repeat check with home machine was 141/93. Denies dizziness, lightheadedness, headaches, swelling, or chest pain.   Current HTN meds: None Previously tried: metoprolol tartrate 12.5 mg BID (held for hypotension 08/10/20) BP goal: <130/80 mmHg  Current hyperlipidemia meds: atorvastatin 80 mg daily Previously  tried: simvastatin 40 mg daily (switched to high-intensity statin s/p CABG)  Family History: Not available  Social History: Smokes a cigarette every now and then  Diet:  -Breakfast: cereal, bananas, peaches -Lunch: spaghetti and meatballs, chicken alfredo -Dinner: salad, tuna fish sandwich, something light -Snacks: raisin cake, honey bun -Drinks: soda twice a week, drinking more water lately  Exercise: Walks >1 mile/day  Home BP readings: 134/79, 121/75, 117/75, 132/87, 130/74, 144/93, 123/84, 125/86, 119/82, 144/88, 145/99, 157/96, 141/79, 130/77, 145/98, 161/93, 134/87, 120/78 (average 134/85)  Labs:  07/04/20: TC 170, LDL 112, HDL 51, TG 35  Wt Readings from Last 3 Encounters:  08/20/20 183 lb (83 kg)  08/10/20 179 lb 6.4 oz (81.4 kg)  07/13/20 186 lb 15.2 oz (84.8 kg)   BP Readings from Last 3 Encounters:  08/20/20 119/80  08/11/20 135/81  08/10/20 (!) 79/40   Pulse Readings from Last 3 Encounters:  08/20/20 100  08/11/20 84  08/10/20 75    Renal function: Estimated Creatinine Clearance: 66.1 mL/min (by C-G formula based on SCr of 1.06 mg/dL).  Past Medical History:  Diagnosis Date   Aortic atherosclerosis ([place])    CT 06/2020   CAD (coronary artery disease)    NSTEMI S/p CABG 6/22 (L-LAD, S-LPDA/OM2 - Dr. Kipp Brood)   COPD (chronic obstructive pulmonary disease) ([place])    Hypercholesteremia    Hypertension    Prostate cancer ([place])    s/p  resection   Thoracic aortic aneurysm ([place])    CT 5/22: 4.4 cm >> rpt in 1 year    Current Outpatient Medications on File Prior to Visit  Medication Sig Dispense Refill   albuterol (PROVENTIL HFA;VENTOLIN HFA) 108 (90 BASE) MCG/ACT inhaler Inhale 1-2 puffs into the lungs every 6 (six) hours as needed for wheezing or shortness of breath.     aspirin 81 MG EC tablet Take 1 tablet (81 mg total) by mouth daily.     atorvastatin (LIPITOR) 80 MG tablet Take 1 tablet (80 mg total) by mouth at bedtime. 90 tablet 3   clopidogrel  (PLAVIX) 75 MG tablet Take 1 tablet (75 mg total) by mouth daily. 90 tablet 3   nitroGLYCERIN (NITROSTAT) 0.4 MG SL tablet Place 1 tablet (0.4 mg total) under the tongue every 5 (five) minutes as needed for chest pain. 25 tablet 3   pantoprazole (PROTONIX) 40 MG tablet Take 1 tablet (40 mg total) by mouth daily. 30 tablet 1   tamsulosin (FLOMAX) 0.4 MG CAPS capsule Take 0.4 mg by mouth daily.     No current facility-administered medications on file prior to visit.    Allergies  Allergen Reactions   Oxycodone Nausea And Vomiting   Pravastatin Itching    Other reaction(s): Xerostomia, Itching     Assessment/Plan:  1. Hypotension - BP in clinic today is slightly above goal <130/80 mmHg. Suspect previous hypotension was due to infection that has now been appropriately treated and resolved. Will resume metoprolol that was started after his CABG but stopped due to hypotension. Will resume as once daily metoprolol succinate 25 mg daily, which is equivalent to his previous metoprolol tartrate dose. Instructed him to continue checking his BP at home and either bring a log or his machine to his next visit so we can see his home readings. Confirmed appropriate home BP technique. Encouraged him to continue walking regularly. Follow up pharmacy visit in 6 weeks to recheck BP and HR.  2. Hyperlipidemia - Most recent LDL of 112 mg/dL is not at goal <70 mg/dL given recent NSTEMI and CABG. He is adherent to atorvastatin 80 mg daily with no adverse effects. He was on moderate-intensity simvastatin prior to switching to atorvastatin s/p CABG. Would not expect that switching to high-intensity atorvastatin would reduce LDL to goal. Will add ezetimibe 10 mg daily and recheck lipid panel and LFTs in 6 weeks at follow up.   Rebbeca Paul, PharmD PGY2 Ambulatory Care Pharmacy Resident 08/24/2020 1:51 PM

## 2020-08-24 ENCOUNTER — Other Ambulatory Visit: Payer: Medicare Other

## 2020-08-24 ENCOUNTER — Other Ambulatory Visit: Payer: Self-pay

## 2020-08-24 ENCOUNTER — Ambulatory Visit (INDEPENDENT_AMBULATORY_CARE_PROVIDER_SITE_OTHER): Payer: Medicare Other | Admitting: Student-PharmD

## 2020-08-24 VITALS — BP 132/88 | HR 87

## 2020-08-24 DIAGNOSIS — I10 Essential (primary) hypertension: Secondary | ICD-10-CM

## 2020-08-24 DIAGNOSIS — E785 Hyperlipidemia, unspecified: Secondary | ICD-10-CM

## 2020-08-24 MED ORDER — METOPROLOL SUCCINATE ER 25 MG PO TB24: 25.0000 mg | ORAL_TABLET | Freq: Every day | ORAL | 3 refills | Status: DC

## 2020-08-24 MED ORDER — EZETIMIBE 10 MG PO TABS
10.0000 mg | ORAL_TABLET | Freq: Every day | ORAL | 3 refills | Status: DC
Start: 2020-08-24 — End: 2021-08-02

## 2020-08-24 NOTE — Patient Instructions (Addendum)
It was nice to see you today!  Your goal blood pressure is less than 130/80 mmHg. In clinic, your blood pressure was 132/88 mmHg.  Medication Changes: Start taking metoprolol succinate 25 mg daily  Begin ezetimibe 10 mg daily - this medication helps lower your cholesterol  Continue atorvastatin 80 mg daily  We will see you in 6 weeks on August 30th at 9am.   Monitor blood pressure at home daily and keep a log (on your phone or piece of paper) to bring with you to your next visit. Write down date, time, blood pressure and pulse.  Keep up the good work with diet and exercise. Aim for a diet full of vegetables, fruit and lean meats (chicken, [place], fish). Try to limit salt intake by eating fresh or frozen vegetables (instead of canned), rinse canned vegetables prior to cooking and do not add any additional salt to meals.

## 2020-09-04 ENCOUNTER — Emergency Department (HOSPITAL_COMMUNITY): Payer: Medicare Other

## 2020-09-04 ENCOUNTER — Observation Stay (HOSPITAL_COMMUNITY)
Admission: EM | Admit: 2020-09-04 | Discharge: 2020-09-05 | Disposition: A | Discharge status: Home / Self Care | Payer: Medicare Other | Attending: Internal Medicine | Admitting: Internal Medicine

## 2020-09-04 DIAGNOSIS — I110 Hypertensive heart disease with heart failure: Secondary | ICD-10-CM | POA: Insufficient documentation

## 2020-09-04 DIAGNOSIS — J449 Chronic obstructive pulmonary disease, unspecified: Secondary | ICD-10-CM | POA: Diagnosis present

## 2020-09-04 DIAGNOSIS — Z7982 Long term (current) use of aspirin: Secondary | ICD-10-CM | POA: Diagnosis not present

## 2020-09-04 DIAGNOSIS — U071 COVID-19: Secondary | ICD-10-CM | POA: Insufficient documentation

## 2020-09-04 DIAGNOSIS — Z7901 Long term (current) use of anticoagulants: Secondary | ICD-10-CM | POA: Diagnosis not present

## 2020-09-04 DIAGNOSIS — F17200 Nicotine dependence, unspecified, uncomplicated: Secondary | ICD-10-CM | POA: Diagnosis not present

## 2020-09-04 DIAGNOSIS — Z8546 Personal history of malignant neoplasm of prostate: Secondary | ICD-10-CM | POA: Diagnosis not present

## 2020-09-04 DIAGNOSIS — I2510 Atherosclerotic heart disease of native coronary artery without angina pectoris: Secondary | ICD-10-CM | POA: Diagnosis present

## 2020-09-04 DIAGNOSIS — Z79899 Other long term (current) drug therapy: Secondary | ICD-10-CM | POA: Insufficient documentation

## 2020-09-04 DIAGNOSIS — I5031 Acute diastolic (congestive) heart failure: Secondary | ICD-10-CM | POA: Insufficient documentation

## 2020-09-04 DIAGNOSIS — R079 Chest pain, unspecified: Secondary | ICD-10-CM | POA: Diagnosis not present

## 2020-09-04 DIAGNOSIS — Z951 Presence of aortocoronary bypass graft: Secondary | ICD-10-CM

## 2020-09-04 DIAGNOSIS — I10 Essential (primary) hypertension: Secondary | ICD-10-CM | POA: Diagnosis present

## 2020-09-04 DIAGNOSIS — R0789 Other chest pain: Secondary | ICD-10-CM | POA: Diagnosis not present

## 2020-09-04 LAB — BASIC METABOLIC PANEL
Anion gap: 6 (ref 5–15)
BUN: 15 mg/dL (ref 8–23)
CO2: 29 mmol/L (ref 22–32)
Calcium: 8.7 mg/dL — ABNORMAL LOW (ref 8.9–10.3)
Chloride: 101 mmol/L (ref 98–111)
Creatinine, Ser: 1.03 mg/dL (ref 0.61–1.24)
GFR, Estimated: >60 mL/min (ref >60)
Glucose, Bld: 100 mg/dL — ABNORMAL HIGH (ref 70–99)
Potassium: 3.7 mmol/L (ref 3.5–5.1)
Sodium: 136 mmol/L (ref 135–145)

## 2020-09-04 LAB — CBC
HCT: 32.8 % — ABNORMAL LOW (ref 39.0–52.0)
Hemoglobin: 10 g/dL — ABNORMAL LOW (ref 13.0–17.0)
MCH: 25.2 pg — ABNORMAL LOW (ref 26.0–34.0)
MCHC: 30.5 g/dL (ref 30.0–36.0)
MCV: 82.6 fL (ref 80.0–100.0)
Platelets: 221 10*3/uL (ref 150–400)
RBC: 3.97 MIL/uL — ABNORMAL LOW (ref 4.22–5.81)
RDW: 15.7 % — ABNORMAL HIGH (ref 11.5–15.5)
WBC: 5.9 10*3/uL (ref 4.0–10.5)
nRBC: 0 % (ref 0.0–0.2)

## 2020-09-04 LAB — TROPONIN I (HIGH SENSITIVITY): Troponin I (High Sensitivity): 19 ng/L — ABNORMAL HIGH (ref <18)

## 2020-09-04 MED ORDER — ASPIRIN 81 MG PO CHEW
324.0000 mg | CHEWABLE_TABLET | Freq: Once | ORAL | Status: AC
  Administered 2020-09-04: 324 mg via ORAL
  Filled 2020-09-04: qty 4

## 2020-09-04 NOTE — ED Triage Notes (Signed)
Pt status post cardiac surgery, does not know what exactly was done.  Pt states it was a month or more ago but today started having left sided chest pain radiating to neck.  States he has a mild cough as well.

## 2020-09-04 NOTE — ED Provider Notes (Signed)
[facility] EMERGENCY DEPARTMENT Provider Note   CSN: [id number] Arrival date & time: 09/04/20  2108     History Chief Complaint  Patient presents with   Chest Pain    Blake Sutton is a 75 y.o. male.  Patient presents to the emergency department for evaluation of chest pain.  Patient reports that he was sitting in his recliner when the pain began.  He felt like he was gasping for air and then felt the pain in the left chest that radiated up to the left side of his neck.  He was profusely diaphoretic at that time.  Patient recently had a heart attack and had bypass surgery.  He reports with his heart attack he only had diaphoresis, never had any chest pain.  He was about to take a nitroglycerin but then did not.  A friend came to his house and drove him to the hospital.  By the time he got to the hospital pain was easing off.      Past Medical History:  Diagnosis Date   Aortic atherosclerosis ([place])    CT 06/2020   CAD (coronary artery disease)    NSTEMI S/p CABG 6/22 (L-LAD, S-LPDA/OM2 - Dr. Kipp Brood)   COPD (chronic obstructive pulmonary disease) ([place])    Hypercholesteremia    Hypertension    Prostate cancer [facility])    s/p resection   Thoracic aortic aneurysm ([place])    CT 5/22: 4.4 cm >> rpt in 1 year    Patient Active Problem List   Diagnosis Date Noted   Chest pain 09/05/2020   Thoracic aortic aneurysm ([place]) 08/09/2020   Aortic atherosclerosis ([place]) 08/09/2020   S/P CABG x 3 07/08/2020   CAD (coronary artery disease) 07/05/2020   Acute respiratory failure with hypoxia ([place]) [id number]   Acute diastolic heart failure ([place]) 07/05/2020   NSTEMI (non-ST elevated myocardial infarction) ([place]) 07/04/2020   Essential hypertension 07/04/2020   COPD (chronic obstructive pulmonary disease) ([place]) 07/04/2020   Hyperlipidemia LDL goal <70 07/04/2020   Elevated d-dimer 07/04/2020   Leukocytosis 07/04/2020   Prolonged QT interval 07/04/2020   CAP (community  acquired pneumonia) 07/04/2020   Altered mental status 07/07/2013   Left-sided weakness 07/07/2013    Past Surgical History:  Procedure Laterality Date   ABDOMINAL SURGERY     "repair bleeding ulcers"   CORONARY ARTERY BYPASS GRAFT N/A 07/08/2020   Procedure: CORONARY ARTERY BYPASS GRAFTING (CABG) X  THREE ,ON PUMP, USING LEFT INTERNAL MAMMARY ARTERY AND RIGHT ENDOSPSCOPIC GREAT SAPHENOUS VEIN HARVEST CONDUITS;  Surgeon: Lajuana Matte, MD;  Location: [facility];  Service: Open Heart Surgery;  Laterality: N/A;   LEFT HEART CATH AND CORONARY ANGIOGRAPHY N/A 07/06/2020   Procedure: LEFT HEART CATH AND CORONARY ANGIOGRAPHY;  Surgeon: Jettie Booze, MD;  Location: [facility];  Service: Cardiovascular;  Laterality: N/A;   NECK SURGERY     ROTATOR CUFF REPAIR     TEE WITHOUT CARDIOVERSION N/A 07/08/2020   Procedure: TRANSESOPHAGEAL ECHOCARDIOGRAM (TEE);  Surgeon: Lajuana Matte, MD;  Location: [facility];  Service: Open Heart Surgery;  Laterality: N/A;       History reviewed. No pertinent family history.  Social History   Tobacco Use   Smoking status: Every Day   Smokeless tobacco: Never  Substance Use Topics   Alcohol use: No   Drug use: No    Home Medications Prior to Admission medications   Medication Sig Start Date End Date Taking? Authorizing Provider  albuterol (PROVENTIL HFA;VENTOLIN HFA) 108 (90 BASE) MCG/ACT inhaler Inhale 1-2 puffs into the lungs every 6 (six) hours as needed for wheezing or shortness of breath.    [provider]  aspirin 81 MG EC tablet Take 1 tablet (81 mg total) by mouth daily. 07/13/20   Gold, Wayne E, PA-C  atorvastatin (LIPITOR) 80 MG tablet Take 1 tablet (80 mg total) by mouth at bedtime. 08/10/20 11/08/20  [name] T, PA-C  clopidogrel (PLAVIX) 75 MG tablet Take 1 tablet (75 mg total) by mouth daily. 08/10/20 08/10/21  [name] T, PA-C  ezetimibe (ZETIA) 10 MG tablet Take 1 tablet (10 mg total) by mouth daily. 08/24/20    [name] T, PA-C  metoprolol succinate (TOPROL XL) 25 MG 24 hr tablet Take 1 tablet (25 mg total) by mouth daily. 08/24/20   [name] T, PA-C  nitroGLYCERIN (NITROSTAT) 0.4 MG SL tablet Place 1 tablet (0.4 mg total) under the tongue every 5 (five) minutes as needed for chest pain. 08/10/20 11/08/20  [name] T, PA-C  pantoprazole (PROTONIX) 40 MG tablet Take 1 tablet (40 mg total) by mouth daily. 08/11/20   [name] T, PA-C  tamsulosin (FLOMAX) 0.4 MG CAPS capsule Take 0.4 mg by mouth daily. 10/06/19   [provider]    Allergies    Oxycodone and Pravastatin  Review of Systems   Review of Systems  Physical Exam Updated Vital Signs BP 124/74   Pulse 63   Temp 99.7 F (37.6 C) (Oral)   Resp 20   SpO2 94%   Physical Exam  ED Results / Procedures / Treatments   Labs (all labs ordered are listed, but only abnormal results are displayed) Labs Reviewed  RESP PANEL BY RT-PCR (FLU A&B, COVID) ARPGX2 - Abnormal; Notable for the following components:      Result Value   SARS Coronavirus 2 by RT PCR POSITIVE (*)    All other components within normal limits  BASIC METABOLIC PANEL - Abnormal; Notable for the following components:   Glucose, Bld 100 (*)    Calcium 8.7 (*)    All other components within normal limits  CBC - Abnormal; Notable for the following components:   RBC 3.97 (*)    Hemoglobin 10.0 (*)    HCT 32.8 (*)    MCH 25.2 (*)    RDW 15.7 (*)    All other components within normal limits  CBC - Abnormal; Notable for the following components:   RBC 3.88 (*)    Hemoglobin 10.0 (*)    HCT 31.8 (*)    MCH 25.8 (*)    RDW 15.9 (*)    All other components within normal limits  BASIC METABOLIC PANEL - Abnormal; Notable for the following components:   Calcium 8.7 (*)    All other components within normal limits  TROPONIN I (HIGH SENSITIVITY) - Abnormal; Notable for the following components:   Troponin I (High Sensitivity) 19 (*)    All other  components within normal limits  TROPONIN I (HIGH SENSITIVITY) - Abnormal; Notable for the following components:   Troponin I (High Sensitivity) 19 (*)    All other components within normal limits  TROPONIN I (HIGH SENSITIVITY) - Abnormal; Notable for the following components:   Troponin I (High Sensitivity) 19 (*)    All other components within normal limits  TROPONIN I (HIGH SENSITIVITY) - Abnormal; Notable for the following components:   Troponin I (High Sensitivity) 20 (*)  All other components within normal limits    EKG None  Radiology DG Chest 2 View  Result Date: 09/04/2020 CLINICAL DATA:  Chest pain. EXAM: CHEST - 2 VIEW COMPARISON:  08/20/2020 FINDINGS: The heart size and mediastinal contours are within normal limits. Prior CABG again noted. Both lungs are clear. The visualized skeletal structures are unremarkable. IMPRESSION: No active cardiopulmonary disease. Electronically Signed   By: Marlaine Hind M.D.   On: 09/04/2020 22:41    Procedures Procedures   Medications Ordered in ED Medications  aspirin EC tablet 81 mg (has no administration in time range)  atorvastatin (LIPITOR) tablet 80 mg (has no administration in time range)  ezetimibe (ZETIA) tablet 10 mg (has no administration in time range)  metoprolol succinate (TOPROL-XL) 24 hr tablet 25 mg (has no administration in time range)  nitroGLYCERIN (NITROSTAT) SL tablet 0.4 mg (has no administration in time range)  clopidogrel (PLAVIX) tablet 75 mg (has no administration in time range)  enoxaparin (LOVENOX) injection 40 mg (has no administration in time range)  sodium chloride flush (NS) 0.9 % injection 3 mL (3 mLs Intravenous Given 09/05/20 0317)  sodium chloride flush (NS) 0.9 % injection 3 mL (has no administration in time range)  0.9 %  sodium chloride infusion (has no administration in time range)  acetaminophen (TYLENOL) tablet 650 mg (has no administration in time range)    Or  acetaminophen (TYLENOL)  suppository 650 mg (has no administration in time range)  albuterol (PROVENTIL) (2.5 MG/3ML) 0.083% nebulizer solution 2.5 mg (has no administration in time range)  aspirin chewable tablet 324 mg (324 mg Oral Given 09/04/20 2357)    ED Course  I have reviewed the triage vital signs and the nursing notes.  Pertinent labs & imaging results that were available during my care of the patient were reviewed by me and considered in my medical decision making (see chart for details).    MDM Rules/Calculators/A&P                           Patient presents to the emergency department for evaluation of chest pain.  Patient suffered an NSTEMI on May 28 and ultimately had three-vessel bypass on June 2.  He has been doing well postoperatively.  Patient had an event tonight where he became acutely short of breath and then had left-sided chest pain radiating into the left neck.  He was profusely diaphoretic.  Symptoms seem to have been self-limited, have resolved by the time he was evaluated in the emergency department.  Troponin is 19, slightly elevated, significance unclear at this time.  Will need additional enzymes.  With his recent history including three-vessel bypass.  Will recommend observation.  Final Clinical Impression(s) / ED Diagnoses Final diagnoses:  Chest pain, unspecified type    Rx / DC Orders ED Discharge Orders     None        Orpah Greek, MD 09/05/20 [phone]

## 2020-09-05 ENCOUNTER — Encounter (HOSPITAL_COMMUNITY): Payer: Self-pay | Admitting: Family Medicine

## 2020-09-05 DIAGNOSIS — Z951 Presence of aortocoronary bypass graft: Secondary | ICD-10-CM | POA: Diagnosis not present

## 2020-09-05 DIAGNOSIS — I2510 Atherosclerotic heart disease of native coronary artery without angina pectoris: Secondary | ICD-10-CM

## 2020-09-05 DIAGNOSIS — R079 Chest pain, unspecified: Secondary | ICD-10-CM | POA: Diagnosis not present

## 2020-09-05 LAB — CBC
HCT: 31.8 % — ABNORMAL LOW (ref 39.0–52.0)
Hemoglobin: 10 g/dL — ABNORMAL LOW (ref 13.0–17.0)
MCH: 25.8 pg — ABNORMAL LOW (ref 26.0–34.0)
MCHC: 31.4 g/dL (ref 30.0–36.0)
MCV: 82 fL (ref 80.0–100.0)
Platelets: 179 10*3/uL (ref 150–400)
RBC: 3.88 MIL/uL — ABNORMAL LOW (ref 4.22–5.81)
RDW: 15.9 % — ABNORMAL HIGH (ref 11.5–15.5)
WBC: 4.1 10*3/uL (ref 4.0–10.5)
nRBC: 0 % (ref 0.0–0.2)

## 2020-09-05 LAB — BASIC METABOLIC PANEL
Anion gap: 7 (ref 5–15)
BUN: 14 mg/dL (ref 8–23)
CO2: 28 mmol/L (ref 22–32)
Calcium: 8.7 mg/dL — ABNORMAL LOW (ref 8.9–10.3)
Chloride: 102 mmol/L (ref 98–111)
Creatinine, Ser: 1.01 mg/dL (ref 0.61–1.24)
GFR, Estimated: >60 mL/min (ref >60)
Glucose, Bld: 91 mg/dL (ref 70–99)
Potassium: 3.5 mmol/L (ref 3.5–5.1)
Sodium: 137 mmol/L (ref 135–145)

## 2020-09-05 LAB — C-REACTIVE PROTEIN: CRP: 0.6 mg/dL (ref <1.0)

## 2020-09-05 LAB — RESP PANEL BY RT-PCR (FLU A&B, COVID) ARPGX2
Influenza A by PCR: NEGATIVE
Influenza B by PCR: NEGATIVE
SARS Coronavirus 2 by RT PCR: POSITIVE — AB

## 2020-09-05 LAB — TROPONIN I (HIGH SENSITIVITY)
Troponin I (High Sensitivity): 19 ng/L — ABNORMAL HIGH (ref <18)
Troponin I (High Sensitivity): 19 ng/L — ABNORMAL HIGH (ref <18)
Troponin I (High Sensitivity): 20 ng/L — ABNORMAL HIGH (ref <18)

## 2020-09-05 MED ORDER — NITROGLYCERIN 0.4 MG SL SUBL: 0.4000 mg | SUBLINGUAL_TABLET | SUBLINGUAL | Status: DC | PRN

## 2020-09-05 MED ORDER — ACETAMINOPHEN 325 MG PO TABS: 650.0000 mg | ORAL_TABLET | Freq: Four times a day (QID) | ORAL | Status: DC | PRN

## 2020-09-05 MED ORDER — ENOXAPARIN SODIUM 40 MG/0.4ML IJ SOSY
40.0000 mg | PREFILLED_SYRINGE | Freq: Every day | INTRAMUSCULAR | Status: DC
  Administered 2020-09-05: 40 mg via SUBCUTANEOUS
  Filled 2020-09-05: qty 0.4

## 2020-09-05 MED ORDER — NIRMATRELVIR/RITONAVIR (PAXLOVID)TABLET
3.0000 | ORAL_TABLET | Freq: Two times a day (BID) | ORAL | Status: DC
  Administered 2020-09-05: 3 via ORAL
  Filled 2020-09-05: qty 30

## 2020-09-05 MED ORDER — METOPROLOL SUCCINATE ER 25 MG PO TB24
25.0000 mg | ORAL_TABLET | Freq: Every day | ORAL | Status: DC
  Administered 2020-09-05: 25 mg via ORAL
  Filled 2020-09-05: qty 1

## 2020-09-05 MED ORDER — ATORVASTATIN CALCIUM 80 MG PO TABS: 80.0000 mg | ORAL_TABLET | Freq: Every day | ORAL | 0 refills | Status: DC

## 2020-09-05 MED ORDER — CLOPIDOGREL BISULFATE 75 MG PO TABS
75.0000 mg | ORAL_TABLET | Freq: Every day | ORAL | Status: DC
  Administered 2020-09-05: 75 mg via ORAL
  Filled 2020-09-05: qty 1

## 2020-09-05 MED ORDER — SODIUM CHLORIDE 0.9 % IV SOLN: 250.0000 mL | INTRAVENOUS | Status: DC | PRN

## 2020-09-05 MED ORDER — ALBUTEROL SULFATE (2.5 MG/3ML) 0.083% IN NEBU: 2.5000 mg | INHALATION_SOLUTION | RESPIRATORY_TRACT | Status: DC | PRN

## 2020-09-05 MED ORDER — ATORVASTATIN CALCIUM 40 MG PO TABS: 80.0000 mg | ORAL_TABLET | Freq: Every day | ORAL | Status: DC

## 2020-09-05 MED ORDER — SODIUM CHLORIDE 0.9% FLUSH
3.0000 mL | Freq: Two times a day (BID) | INTRAVENOUS | Status: DC
  Administered 2020-09-05 (×2): 3 mL via INTRAVENOUS

## 2020-09-05 MED ORDER — SODIUM CHLORIDE 0.9% FLUSH: 3.0000 mL | INTRAVENOUS | Status: DC | PRN

## 2020-09-05 MED ORDER — ASPIRIN EC 81 MG PO TBEC
81.0000 mg | DELAYED_RELEASE_TABLET | Freq: Every day | ORAL | Status: DC
  Administered 2020-09-05: 81 mg via ORAL
  Filled 2020-09-05: qty 1

## 2020-09-05 MED ORDER — ACETAMINOPHEN 650 MG RE SUPP: 650.0000 mg | Freq: Four times a day (QID) | RECTAL | Status: DC | PRN

## 2020-09-05 MED ORDER — NIRMATRELVIR/RITONAVIR (PAXLOVID)TABLET: 3.0000 | ORAL_TABLET | Freq: Two times a day (BID) | ORAL | 0 refills | Status: AC

## 2020-09-05 MED ORDER — ALBUTEROL SULFATE HFA 108 (90 BASE) MCG/ACT IN AERS: 2.0000 | INHALATION_SPRAY | RESPIRATORY_TRACT | Status: DC | PRN

## 2020-09-05 MED ORDER — EZETIMIBE 10 MG PO TABS
10.0000 mg | ORAL_TABLET | Freq: Every day | ORAL | Status: DC
  Administered 2020-09-05: 10 mg via ORAL
  Filled 2020-09-05 (×2): qty 1

## 2020-09-05 NOTE — H&P (Signed)
History and Physical    Blake Sutton [id number] DOB: 1945/02/19 DOA: 09/04/2020  PCP: Serita Grammes, MD   Patient coming from: Home  Chief Complaint: Chest pain  HPI: Blake Sutton is a 75 y.o. male with medical history significant for HTN,CAD, COPD, s/p CABG x3 who presents for evaluation of chest pain.  He reports he was sitting in his recliner watching TV when he had a sudden onset of left-sided chest pain that radiated into his neck.  He reports he felt nauseous and had shortness of breath associated with the chest pain.  After a minute he began to have profuse diaphoresis which worried him so he called a friend and was brought to the emergency room.  He reports that when he had his heart attack in the April 2022 and presented with diaphoresis nausea and vomiting.  He did not take any nitroglycerin at home for the symptoms.  The time he arrived to the emergency room the symptoms had resolved.  He states that the chest pain lasted approximately 8 minutes.  He reports he has been taking his medication as prescribed and has been walking 2 miles a day exercise.  He has not done any heavy lifting and he has not had any trauma or injury to his chest abdomen or head.  States he did not have any dizziness or syncope with the chest pain. States he continues to smoke but is down to only 2 cigarettes a day and is working to quit completely.  He reports he quit drinking alcohol in 1988.  He denies illicit drug use  ED Course: Blake Sutton has been hemodynamically stable in the emergency room. He had mild elevation of troponin of 19 in ER. With his symptoms and history the hospitalist service was asked to observe patient overnight for further work-up.  Labs showed a sodium of 136, potassium 3.7, chloride 101, bicarb 29, creatinine 1.03, BUN 15, troponin 19x2, WBC 5900, hemoglobin 10, hematocrit 32.8, platelets 201,000.  Review of Systems:  General: Denies weakness, fever, chills, weight loss,  night sweats.  Denies dizziness.  Denies change in appetite HENT: Denies head trauma, headache, denies change in hearing, tinnitus.  Denies nasal congestion or bleeding.  Denies sore throat, sores in mouth.  Denies difficulty swallowing Eyes: Denies blurry vision, pain in eye, drainage.  Denies discoloration of eyes. Neck: Denies pain.  Denies swelling.  Denies pain with movement. Cardiovascular: Reports chest pain. Denies palpitations.  Denies edema.  Denies orthopnea Respiratory: Reports shortness of breath. Denies cough.  Denies wheezing.  Denies sputum production Gastrointestinal: Denies abdominal pain, swelling. Reports nausea but no vomiting, diarrhea.  Denies melena.  Denies hematemesis. Musculoskeletal: Denies limitation of movement.  Denies deformity or swelling.  Denies pain.  Denies arthralgias or myalgias. Genitourinary: Denies pelvic pain.  Denies urinary frequency or hesitancy.  Denies dysuria.  Skin: Denies rash.  Denies petechiae, purpura, ecchymosis. Neurological: Denies syncope. Denies seizure activity. Denies paresthesia. Denies slurred speech, drooping face. Denies visual change. Psychiatric: Denies depression, anxiety. Denies hallucinations.  Past Medical History:  Diagnosis Date   Aortic atherosclerosis ([place])    CT 06/2020   CAD (coronary artery disease)    NSTEMI S/p CABG 6/22 (L-LAD, S-LPDA/OM2 - Dr. Kipp Brood)   COPD (chronic obstructive pulmonary disease) ([place])    Hypercholesteremia    Hypertension    Prostate cancer [facility])    s/p resection   Thoracic aortic aneurysm ([place])    CT 5/22: 4.4 cm >> rpt in 1 year  Past Surgical History:  Procedure Laterality Date   ABDOMINAL SURGERY     "repair bleeding ulcers"   CORONARY ARTERY BYPASS GRAFT N/A 07/08/2020   Procedure: CORONARY ARTERY BYPASS GRAFTING (CABG) X  THREE ,ON PUMP, USING LEFT INTERNAL MAMMARY ARTERY AND RIGHT ENDOSPSCOPIC GREAT SAPHENOUS VEIN HARVEST CONDUITS;  Surgeon: Lajuana Matte, MD;   Location: [employer];  Service: Open Heart Surgery;  Laterality: N/A;   LEFT HEART CATH AND CORONARY ANGIOGRAPHY N/A 07/06/2020   Procedure: LEFT HEART CATH AND CORONARY ANGIOGRAPHY;  Surgeon: Jettie Booze, MD;  Location: [place] [facility];  Service: Cardiovascular;  Laterality: N/A;   NECK SURGERY     ROTATOR CUFF REPAIR     TEE WITHOUT CARDIOVERSION N/A 07/08/2020   Procedure: TRANSESOPHAGEAL ECHOCARDIOGRAM (TEE);  Surgeon: Lajuana Matte, MD;  Location: [place];  Service: Open Heart Surgery;  Laterality: N/A;    Social History  reports that he has been smoking. He has never used smokeless tobacco. He reports that he does not drink alcohol and does not use drugs.  Allergies  Allergen Reactions   Oxycodone Nausea And Vomiting   Pravastatin Itching    Other reaction(s): Xerostomia, Itching    History reviewed. No pertinent family history.   Prior to Admission medications   Medication Sig Start Date End Date Taking? Authorizing Provider  albuterol (PROVENTIL HFA;VENTOLIN HFA) 108 (90 BASE) MCG/ACT inhaler Inhale 1-2 puffs into the lungs every 6 (six) hours as needed for wheezing or shortness of breath.    [provider]  aspirin 81 MG EC tablet Take 1 tablet (81 mg total) by mouth daily. 07/13/20   Gold, Wayne E, PA-C  atorvastatin (LIPITOR) 80 MG tablet Take 1 tablet (80 mg total) by mouth at bedtime. 08/10/20 11/08/20  [name] T, PA-C  clopidogrel (PLAVIX) 75 MG tablet Take 1 tablet (75 mg total) by mouth daily. 08/10/20 08/10/21  [name] T, PA-C  ezetimibe (ZETIA) 10 MG tablet Take 1 tablet (10 mg total) by mouth daily. 08/24/20   [name] T, PA-C  metoprolol succinate (TOPROL XL) 25 MG 24 hr tablet Take 1 tablet (25 mg total) by mouth daily. 08/24/20   [name] T, PA-C  nitroGLYCERIN (NITROSTAT) 0.4 MG SL tablet Place 1 tablet (0.4 mg total) under the tongue every 5 (five) minutes as needed for chest pain. 08/10/20 11/08/20  [name] T, PA-C  pantoprazole  (PROTONIX) 40 MG tablet Take 1 tablet (40 mg total) by mouth daily. 08/11/20   [name] T, PA-C  tamsulosin (FLOMAX) 0.4 MG CAPS capsule Take 0.4 mg by mouth daily. 10/06/19   [provider]    Physical Exam: Vitals:   09/04/20 2143 09/04/20 2144 09/04/20 2345 09/05/20 0100  BP: 117/78 117/78 135/75 132/81  Pulse: 72 77 69 75  Resp: 17 18 (!) 25 (!) 21  Temp: 99.7 F (37.6 C) 99.7 F (37.6 C)    TempSrc: Oral Oral    SpO2: 98% 98% 96% 98%    Constitutional: NAD, calm, comfortable Vitals:   09/04/20 2143 09/04/20 2144 09/04/20 2345 09/05/20 0100  BP: 117/78 117/78 135/75 132/81  Pulse: 72 77 69 75  Resp: 17 18 (!) 25 (!) 21  Temp: 99.7 F (37.6 C) 99.7 F (37.6 C)    TempSrc: Oral Oral    SpO2: 98% 98% 96% 98%   General: WDWN, Alert and oriented x3.  Eyes: EOMI, PERRL, conjunctivae normal.  Sclera nonicteric HENT:  [place]/AT, external ears normal.  Nares patent without epistasis.  Mucous membranes are moist. Poor dentition.  Neck: Soft, normal range of motion, supple, no masses, no thyromegaly.  Trachea midline Respiratory: Equal breath sounds with diffuse rales. no wheezing, no crackles. Normal respiratory effort. No accessory muscle use.  Cardiovascular: Regular rate and rhythm, no murmurs / rubs / gallops. No extremity edema. 1+ pedal pulses. Healed midline sternal incision. Abdomen: Soft, no tenderness, nondistended, no rebound or guarding.  No masses palpated. Bowel sounds normoactive Musculoskeletal: FROM. Has clubbing of digits. no cyanosis. No joint deformity upper and lower extremities. Normal muscle tone.  Skin: Warm, dry, intact no rashes, lesions, ulcers. No induration Neurologic: CN 2-12 grossly intact.  Normal speech.  Sensation intact to touch.Strength 5/5 in all extremities.   Psychiatric: Normal judgment and insight.  Normal mood.    Labs on Admission: I have personally reviewed following labs and imaging studies  CBC: Recent Labs  Lab  09/04/20 2201  WBC 5.9  HGB 10.0*  HCT 32.8*  MCV 82.6  PLT [id number]    Basic Metabolic Panel: Recent Labs  Lab 09/04/20 2201  NA 136  K 3.7  CL 101  CO2 29  GLUCOSE 100*  BUN 15  CREATININE 1.03  CALCIUM 8.7*    GFR: CrCl cannot be calculated (Unknown ideal weight.).  Liver Function Tests: No results for input(s): AST, ALT, ALKPHOS, BILITOT, PROT, ALBUMIN in the last 168 hours.  Urine analysis:    Component Value Date/Time   COLORURINE YELLOW 08/11/2020 1033   APPEARANCEUR CLOUDY (A) 08/11/2020 1033   LABSPEC 1.016 08/11/2020 1033   PHURINE 6.0 08/11/2020 1033   GLUCOSEU NEGATIVE 08/11/2020 1033   HGBUR SMALL (A) 08/11/2020 1033   BILIRUBINUR NEGATIVE 08/11/2020 1033   KETONESUR NEGATIVE 08/11/2020 1033   PROTEINUR NEGATIVE 08/11/2020 1033   UROBILINOGEN 0.2 07/07/2013 1315   NITRITE NEGATIVE 08/11/2020 1033   LEUKOCYTESUR LARGE (A) 08/11/2020 1033    Radiological Exams on Admission: DG Chest 2 View  Result Date: 09/04/2020 CLINICAL DATA:  Chest pain. EXAM: CHEST - 2 VIEW COMPARISON:  08/20/2020 FINDINGS: The heart size and mediastinal contours are within normal limits. Prior CABG again noted. Both lungs are clear. The visualized skeletal structures are unremarkable. IMPRESSION: No active cardiopulmonary disease. Electronically Signed   By: Marlaine Hind M.D.   On: 09/04/2020 22:41    EKG: Independently reviewed.  EKG not available.  Patient in sinus rhythm on cardiac monitor with no acute ST elevation or depression.  Assessment/Plan Principal Problem:   Chest pain Blake Sutton will be placed on cardiac telemetry for observation for chest pain.  Obtain serial troponin levels.  He had an echo a few months ago which showed EF of 55-60% with LVH and hypokinesis of inferolateral wall. Antiplatelet therapy with aspirin and plavix daily. Continue statin therapy.  Monitor blood pressure.  Nitroglycerin as needed.  Supplemental oxygen as needed to maintain O2 sat between  92-96%. Consult cardiology in am to evaluate pt with recent NSTEMI requiring 3 vessel CABG.   Active Problems:   Essential hypertension Continue metoprolol.  Monitor blood pressure    COPD (chronic obstructive pulmonary disease) Albuterol as needed for shortness of breath, cough, wheeze.  Patient stronger discontinue smoking.  He reports he is down to only 2 cigarettes a day    CAD (coronary artery disease) Continue therapy with metoprolol, Lipitor and Zetia.  Nitroglycerin sublingual as needed    S/P CABG x 3     DVT prophylaxis: Lovenox for DVT prophylaxis  Code Status:   Full Code  Family Communication:  Diagnosis and plan discussed with patient.  Patient verbalized understanding agrees with plan.  Further recommendations to follow as clinical indicated Disposition Plan:   Patient is from:  Home  Anticipated DC to:  Home  Anticipated DC date:  Anticipate less than 2 midnight stay  Admission status:  Observation   Yevonne Aline Kyisha Fowle MD [facility]  How to contact the [facility] Attending or Consulting provider [place] or covering provider during after hours [place], for this patient?   Check the care team in [facility] and look for a) attending/consulting [facility] provider listed and b) the [facility] team listed Log into [email or web address] and use [place]'s universal password to access. If you do not have the password, please contact the hospital operator. Locate the [facility] provider you are looking for under [facility] and page to a number that you can be directly reached. If you still have difficulty reaching the provider, please page the [facility] (Director on Call) for the Hospitalists listed on amion for assistance.  09/05/2020, 2:06 AM

## 2020-09-05 NOTE — Discharge Summary (Signed)
Physician Discharge Summary  Blake Sutton [id number] DOB: 06-Oct-1945 DOA: 09/04/2020  PCP: Serita Grammes, MD  Admit date: 09/04/2020 Discharge date: 09/05/2020  Admitted From: home Disposition:  home  Recommendations for Outpatient Follow-up:  Follow up with PCP in 1-2 weeks Stay on isolation for x10 days Follow-up with PCP Monday to discuss more about COVID  Home Health:no  Equipment/Devices: none  Discharge Condition: Stable Code Status:   Code Status: Full Code Diet recommendation:  Diet Order             Diet Heart Room service appropriate? Yes; Fluid consistency: Thin  Diet effective now                   Brief/Interim Summary: 75 y.o. male with medical history significant for HTN,CAD, COPD, s/p CABG x3 who presents for evaluation of chest pain.  He reports he was sitting in his recliner watching TV when he had a sudden onset of left-sided chest pain that radiated into his neck.  He reports he felt nauseous and had shortness of breath associated with the chest pain.  After a minute he began to have profuse diaphoresis which worried him so he called a friend and was brought to the emergency room.  He reports that when he had his heart attack in the April 2022 and presented with diaphoresis nausea and vomiting.  He did not take any nitroglycerin at home for the symptoms.  The time he arrived to the emergency room the symptoms had resolved.  He states that the chest pain lasted approximately 8 minutes.  He reports he has been taking his medication as prescribed and has been walking 2 miles a day exercise.  He has not done any heavy lifting and he has not had any trauma or injury to his chest abdomen or head.  States he did not have any dizziness or syncope with the chest pain. States he continues to smoke but is down to only 2 cigarettes a day and is working to quit completely.  He reports he quit drinking alcohol in 1988.  He denies illicit drug use   ED Course: Mr.  Fasolino has been hemodynamically stable in the emergency room. He had mild elevation of troponin of 19 in ER. With his symptoms and history the hospitalist service was asked to observe patient overnight for further work-up.  Labs showed a sodium of 136, potassium 3.7, chloride 101, bicarb 29, creatinine 1.03, BUN 15, troponin 19x2, WBC 5900, hemoglobin 10, hematocrit 32.8, platelets 201,000 Patient was admitted and monitored.  Patient COVID came back positive. He was seen by cardiology felt to be noncardiac pain no further work-up advised After discussion placed on paxlovid and being discharged home.  Discharge Diagnoses:   Chest pain: Transient chest pain, likely noncardiac, troponin 19> 19> 19> 20.  Seen by cardiology no further cardiac work-up advised.  Patient resume his home cardiac medication  COVID-positive asymptomatic: No respiratory symptoms no hypoxia.  Checked CRP= nomrmal.  Advised isolation x10 days PCP follow-up. We discussed and he agrees for paxlovid-pharmacy consulted re interaction/adverse reactions- pt advised to hold statins while on paxlovid.  Essential hypertension: BP stable COPD not in exacerbation CAD   S/P CABG x 3: Continue home cardiac meds including aspirin, Lipitor, Plavix Toprol JTR.  Consults: Cardiology  Subjective: Seen this morning no chest pain.  Wants to go home.  No cough fever chills weakness no respiratory symptoms.  Discharge Exam: Vitals:   09/05/20 1315 09/05/20 1330  BP: Marland Kitchen)  155/107 (!) 150/89  Pulse: 79 73  Resp: (!) 21 (!) 22  Temp:    SpO2: 97% 95%   General: Pt is alert, awake, not in acute distress Cardiovascular: RRR, S1/S2 +, no rubs, no gallops Respiratory: CTA bilaterally, no wheezing, no rhonchi Abdominal: Soft, NT, ND, bowel sounds + Extremities: no edema, no cyanosis  Discharge Instructions   Allergies as of 09/05/2020       Reactions   Oxycodone Nausea And Vomiting   Pravastatin Itching   Other reaction(s):  Xerostomia, Itching        Medication List     TAKE these medications    albuterol 108 (90 Base) MCG/ACT inhaler Commonly known as: VENTOLIN HFA Inhale 1-2 puffs into the lungs every 6 (six) hours as needed for wheezing or shortness of breath.   aspirin 81 MG EC tablet Take 1 tablet (81 mg total) by mouth daily.   atorvastatin 80 MG tablet Commonly known as: LIPITOR Take 1 tablet (80 mg total) by mouth at bedtime for 1 day. Hold while on paxlovid Start taking on: September 10, 2020 What changed:  additional instructions These instructions start on September 10, 2020. If you are unsure what to do until then, ask your doctor or other care provider.   clopidogrel 75 MG tablet Commonly known as: Plavix Take 1 tablet (75 mg total) by mouth daily.   ezetimibe 10 MG tablet Commonly known as: ZETIA Take 1 tablet (10 mg total) by mouth daily.   metoprolol succinate 25 MG 24 hr tablet Commonly known as: Toprol XL Take 1 tablet (25 mg total) by mouth daily.   nirmatrelvir/ritonavir EUA Tabs Commonly known as: PAXLOVID Take 3 tablets by mouth 2 (two) times daily for 5 days. Patient GFR is > 60. Take nirmatrelvir (150 mg) two tablets twice daily for 5 days and ritonavir (100 mg) one tablet twice daily for 5 days.   nitroGLYCERIN 0.4 MG SL tablet Commonly known as: NITROSTAT Place 1 tablet (0.4 mg total) under the tongue every 5 (five) minutes as needed for chest pain.   pantoprazole 40 MG tablet Commonly known as: PROTONIX Take 1 tablet (40 mg total) by mouth daily.   tamsulosin 0.4 MG Caps capsule Commonly known as: FLOMAX Take 0.4 mg by mouth daily.        Follow-up Information     Serita Grammes, MD Follow up in 1 week(s).   Specialty: Family Medicine Contact information: [place] [address] [phone]         Jerline Pain, MD .   Specialty: Cardiology Contact information: [phone] [place]  [address] [phone]                Allergies  Allergen Reactions   Oxycodone Nausea And Vomiting   Pravastatin Itching    Other reaction(s): Xerostomia, Itching    The results of significant diagnostics from this hospitalization (including imaging, microbiology, ancillary and laboratory) are listed below for reference.    Microbiology: Recent Results (from the past 240 hour(s))  Resp Panel by RT-PCR (Flu A&B, Covid) Nasopharyngeal Swab     Status: Abnormal   Collection Time: 09/05/20  2:15 AM   Specimen: Nasopharyngeal Swab; Nasopharyngeal(NP) swabs in vial transport medium  Result Value Ref Range Status   SARS Coronavirus 2 by RT PCR POSITIVE (A) NEGATIVE Final    Comment: RESULT CALLED TO, READ BACK BY AND VERIFIED WITH: Osvaldo Angst RN, AT [id number] 09/05/20 D. Victoriano Lain (  NOTE) SARS-CoV-2 target nucleic acids are DETECTED.  The SARS-CoV-2 RNA is generally detectable in upper respiratory specimens during the acute phase of infection. Positive results are indicative of the presence of the identified virus, but do not rule out bacterial infection or co-infection with other pathogens not detected by the test. Clinical correlation with patient history and other diagnostic information is necessary to determine patient infection status. The expected result is Negative.  Fact Sheet for Patients: EntrepreneurPulse.com.au  Fact Sheet for Healthcare Providers: IncredibleEmployment.be  This test is not yet approved or cleared by the [place] FDA and  has been authorized for detection and/or diagnosis of SARS-CoV-2 by FDA under an Emergency Use Authorization (EUA).  This EUA will remain in effect (meaning this test  can be used) for the duration of  the COVID-19 declaration under Section 564(b)(1) of the Act, 21 U.S.C. section 360bbb-3(b)(1), unless the authorization is terminated or revoked sooner.     Influenza A by PCR NEGATIVE NEGATIVE  Final   Influenza B by PCR NEGATIVE NEGATIVE Final    Comment: (NOTE) The Xpert Xpress SARS-CoV-2/FLU/RSV plus assay is intended as an aid in the diagnosis of influenza from Nasopharyngeal swab specimens and should not be used as a sole basis for treatment. Nasal washings and aspirates are unacceptable for Xpert Xpress SARS-CoV-2/FLU/RSV testing.  Fact Sheet for Patients: EntrepreneurPulse.com.au  Fact Sheet for Healthcare Providers: IncredibleEmployment.be  This test is not yet approved or cleared by the [place] FDA and has been authorized for detection and/or diagnosis of SARS-CoV-2 by FDA under an Emergency Use Authorization (EUA). This EUA will remain in effect (meaning this test can be used) for the duration of the COVID-19 declaration under Section 564(b)(1) of the Act, 21 U.S.C. section 360bbb-3(b)(1), unless the authorization is terminated or revoked.  Performed at [facility], [place] [address], [place], [place] [address]     Procedures/Studies: DG Chest 2 View  Result Date: 09/04/2020 CLINICAL DATA:  Chest pain. EXAM: CHEST - 2 VIEW COMPARISON:  08/20/2020 FINDINGS: The heart size and mediastinal contours are within normal limits. Prior CABG again noted. Both lungs are clear. The visualized skeletal structures are unremarkable. IMPRESSION: No active cardiopulmonary disease. Electronically Signed   By: Marlaine Hind M.D.   On: 09/04/2020 22:41   DG Chest 2 View  Result Date: 08/20/2020 CLINICAL DATA:  History of CABG.  History of right apical nodule EXAM: CHEST - 2 VIEW COMPARISON:  08/11/2020 FINDINGS: Post CABG changes. The heart size and mediastinal contours are within normal limits. Atherosclerotic calcification of the aortic knob. Previously seen right apical nodular density is not definitively seen on today's exam. Both lungs are clear. The visualized skeletal structures are unremarkable. IMPRESSION: 1. No active  cardiopulmonary disease. 2. Previously seen right apical nodular density is not definitively seen on today's exam. Electronically Signed   By: Davina Poke D.O.   On: 08/20/2020 13:53   DG Chest 2 View  Result Date: 08/11/2020 CLINICAL DATA:  Fevers and chills. EXAM: CHEST - 2 VIEW COMPARISON:  07/10/2020.  07/09/2020.  CT 07/04/2020. FINDINGS: EKG leads noted over the chest. Surgical clips noted over the lower chest. Prior CABG. Heart size normal. Interval clearing of bilateral pulmonary infiltrates/edema. Nodular density noted over the right apex. Given interval clearing of pulmonary infiltrates/edema repeat nonenhanced chest CT suggested to exclude a right apical pulmonary nodule. Tiny right pleural effusion cannot be excluded on lateral view. No pneumothorax. No acute bony abnormality identified. Postsurgical changes right shoulder.  IMPRESSION: 1.  Prior CABG.  Heart size normal. 2. Interval clearing of bilateral pulmonary infiltrates/edema. Nodular density noted over the right apex. Given interval clearing of pulmonary infiltrates/edema repeat nonenhanced chest CT suggested to exclude a right apical pulmonary nodule. Electronically Signed   By: Marcello Moores  Register   On: 08/11/2020 11:37   CT Chest Wo Contrast  Result Date: 08/11/2020 CLINICAL DATA:  Fever of unknown origin. EXAM: CT CHEST WITHOUT CONTRAST TECHNIQUE: Multidetector CT imaging of the chest was performed following the standard protocol without IV contrast. COMPARISON:  CT angio chest 07/04/2020 FINDINGS: Cardiovascular: Interval CABG procedure since 07/04/2020. Mild cardiac enlargement. Aortic atherosclerosis. Postop change noted within the anterior mediastinum including small fluid collection which measures approximately 2.8 x 2.0 by 2.3 cm, image 47/3. Nonspecific. Mediastinum/Nodes: Normal appearance of the thyroid gland. The trachea appears patent and is midline. Normal appearance of the esophagus. No adenopathy identified within the  chest. Lungs/Pleura: Trace right pleural effusion. Centrilobular and paraseptal emphysema. No atelectasis, airspace consolidation, or pneumothorax. Small nodule versus scar noted within the lateral right apex measuring 9 mm, image 22/4. Upper Abdomen: No acute abnormality. Musculoskeletal: Status post median sternotomy. Sternotomy defect remains distinct. Cerclage wires appear intact. Mild peristernal soft tissue stranding is likely postoperative. Degenerative disc disease identified within the lower thoracic spine. IMPRESSION: 1. Interval CABG procedure since 07/04/2020. 2. Small fluid collection within the anterior mediastinum is likely postoperative in etiology. This is technically incompletely characterized without IV contrast material. If there is a clinical concern for infection consider repeat imaging with IV contrast material. 3. Emphysema and aortic atherosclerosis. 4. 9 mm right upper lobe lung nodule versus scar. Recommend follow-up imaging in 3-6 months to ensure stability of this density. Aortic Atherosclerosis (ICD10-I70.0) and Emphysema (ICD10-J43.9). Electronically Signed   By: Kerby Moors M.D.   On: 08/11/2020 13:57   CT Chest W Contrast  Result Date: 08/11/2020 CLINICAL DATA:  Chest pain.  Fever.  Postop EXAM: CT CHEST WITH CONTRAST TECHNIQUE: Multidetector CT imaging of the chest was performed during intravenous contrast administration. CONTRAST:  24m OMNIPAQUE IOHEXOL 300 MG/ML  SOLN COMPARISON:  None. FINDINGS: Cardiovascular: Normal heart size. No significant pericardial effusion. The thoracic aorta is normal in caliber. Severe atherosclerotic plaque of the thoracic aorta. Four-vessel coronary artery calcifications status post coronary artery bypass graft. The main pulmonary artery is normal in caliber. Mediastinum/Nodes: Similar-appearing 3 cm region of simple free fluid along the left intramammary artery graft at the level of the aortic arch within the anterior mediastinum likely  represents postsurgical changes. No abscess formation or contrast extravasation. No enlarged mediastinal, hilar, or axillary lymph nodes. Thyroid gland, trachea, and esophagus demonstrate no significant findings. Lungs/Pleura: Mild centrilobular and Paraseptal emphysematous changes. No focal consolidation. Similar appearing right apical 0.9 x 0.7 cm partially calcified nodular-like lesion (4:27). No pulmonary mass. Question trace right pleural effusion. No left pleural effusion. No pneumothorax. Upper Abdomen: Fluid density lesions within the kidneys likely represents a simple renal cyst. No acute abnormality. Musculoskeletal: No chest wall abnormality. No suspicious lytic or blastic osseous lesions. No acute displaced fracture. IMPRESSION: 1. Postsurgical changes status post coronary artery bypass graft. No abscess formation. 2. Right apical 0.9 x 0.7 cm partially calcified nodular-like lesion. Non-contrast chest CT at 6-12 months is recommended. If the nodule is stable at time of repeat CT, then future CT at 18-24 months (from today's scan) is considered optional for low-risk patients, but is recommended for high-risk patients. This recommendation follows the consensus statement: Guidelines for  Management of Incidental Pulmonary Nodules Detected on CT Images: From the Fleischner Society 2017; Radiology 2017; 284:228-243. 3. Otherwise no acute intrathoracic abnormality. 4. Aortic Atherosclerosis (ICD10-I70.0) and Emphysema (ICD10-J43.9). Electronically Signed   By: Iven Finn M.D.   On: 08/11/2020 16:57    Labs: BNP (last 3 results) Recent Labs    07/04/20 0456  BNP [id number]*   Basic Metabolic Panel: Recent Labs  Lab 09/04/20 2201 09/05/20 0307  NA 136 137  K 3.7 3.5  CL 101 102  CO2 29 28  GLUCOSE 100* 91  BUN 15 14  CREATININE 1.03 1.01  CALCIUM 8.7* 8.7*   Liver Function Tests: No results for input(s): AST, ALT, ALKPHOS, BILITOT, PROT, ALBUMIN in the last 168 hours. No results for  input(s): LIPASE, AMYLASE in the last 168 hours. No results for input(s): AMMONIA in the last 168 hours. CBC: Recent Labs  Lab 09/04/20 2201 09/05/20 0307  WBC 5.9 4.1  HGB 10.0* 10.0*  HCT 32.8* 31.8*  MCV 82.6 82.0  PLT 221 179   Cardiac Enzymes: No results for input(s): CKTOTAL, CKMB, CKMBINDEX, TROPONINI in the last 168 hours. BNP: Invalid input(s): POCBNP CBG: No results for input(s): GLUCAP in the last 168 hours. D-Dimer No results for input(s): DDIMER in the last 72 hours. Hgb A1c No results for input(s): HGBA1C in the last 72 hours. Lipid Profile No results for input(s): CHOL, HDL, LDLCALC, TRIG, CHOLHDL, LDLDIRECT in the last 72 hours. Thyroid function studies No results for input(s): TSH, T4TOTAL, T3FREE, THYROIDAB in the last 72 hours.  Invalid input(s): FREET3 Anemia work up No results for input(s): VITAMINB12, FOLATE, FERRITIN, TIBC, IRON, RETICCTPCT in the last 72 hours. Urinalysis    Component Value Date/Time   COLORURINE YELLOW 08/11/2020 1033   APPEARANCEUR CLOUDY (A) 08/11/2020 1033   LABSPEC 1.016 08/11/2020 1033   PHURINE 6.0 08/11/2020 1033   GLUCOSEU NEGATIVE 08/11/2020 1033   HGBUR SMALL (A) 08/11/2020 1033   BILIRUBINUR NEGATIVE 08/11/2020 1033   KETONESUR NEGATIVE 08/11/2020 1033   PROTEINUR NEGATIVE 08/11/2020 1033   UROBILINOGEN 0.2 07/07/2013 1315   NITRITE NEGATIVE 08/11/2020 1033   LEUKOCYTESUR LARGE (A) 08/11/2020 1033   Sepsis Labs Invalid input(s): PROCALCITONIN,  WBC,  LACTICIDVEN Microbiology Recent Results (from the past 240 hour(s))  Resp Panel by RT-PCR (Flu A&B, Covid) Nasopharyngeal Swab     Status: Abnormal   Collection Time: 09/05/20  2:15 AM   Specimen: Nasopharyngeal Swab; Nasopharyngeal(NP) swabs in vial transport medium  Result Value Ref Range Status   SARS Coronavirus 2 by RT PCR POSITIVE (A) NEGATIVE Final    Comment: RESULT CALLED TO, READ BACK BY AND VERIFIED WITH: Osvaldo Angst RN, AT [id number] 09/05/20 D.  VANHOOK (NOTE) SARS-CoV-2 target nucleic acids are DETECTED.  The SARS-CoV-2 RNA is generally detectable in upper respiratory specimens during the acute phase of infection. Positive results are indicative of the presence of the identified virus, but do not rule out bacterial infection or co-infection with other pathogens not detected by the test. Clinical correlation with patient history and other diagnostic information is necessary to determine patient infection status. The expected result is Negative.  Fact Sheet for Patients: EntrepreneurPulse.com.au  Fact Sheet for Healthcare Providers: IncredibleEmployment.be  This test is not yet approved or cleared by the [place] FDA and  has been authorized for detection and/or diagnosis of SARS-CoV-2 by FDA under an Emergency Use Authorization (EUA).  This EUA will remain in effect (meaning this test  can be used) for the duration  of  the COVID-19 declaration under Section 564(b)(1) of the Act, 21 U.S.C. section 360bbb-3(b)(1), unless the authorization is terminated or revoked sooner.     Influenza A by PCR NEGATIVE NEGATIVE Final   Influenza B by PCR NEGATIVE NEGATIVE Final    Comment: (NOTE) The Xpert Xpress SARS-CoV-2/FLU/RSV plus assay is intended as an aid in the diagnosis of influenza from Nasopharyngeal swab specimens and should not be used as a sole basis for treatment. Nasal washings and aspirates are unacceptable for Xpert Xpress SARS-CoV-2/FLU/RSV testing.  Fact Sheet for Patients: EntrepreneurPulse.com.au  Fact Sheet for Healthcare Providers: IncredibleEmployment.be  This test is not yet approved or cleared by the [place] FDA and has been authorized for detection and/or diagnosis of SARS-CoV-2 by FDA under an Emergency Use Authorization (EUA). This EUA will remain in effect (meaning this test can be used) for the duration of  the COVID-19 declaration under Section 564(b)(1) of the Act, 21 U.S.C. section 360bbb-3(b)(1), unless the authorization is terminated or revoked.  Performed at [facility], [place] [address], [place], [place] [address]      Time coordinating discharge: 25 minutes  SIGNED: Antonieta Pert, MD  [facility] 09/05/2020, 2:41 PM  If 7PM-7AM, please contact night-coverage [email or web address]

## 2020-09-05 NOTE — Consult Note (Signed)
Cardiology Consultation:   Patient ID: Blake Sutton MRN: [id number]; DOB: [birth year]  Admit date: 09/04/2020 Date of Consult: 09/05/2020  PCP:  Serita Grammes, MD   [facility] Providers Cardiologist:  Candee Furbish, MD   Patient Profile:   Blake Sutton is a 75 y.o. male with a hx of CAD s/p CABG 3v in 07/2020, HTN, COPD who is being seen 09/05/2020 for the evaluation of cheset pain at the request of Dr Tonie Griffith.  History of Present Illness:   Blake Sutton presented after experiencing sudden onset chset pain while watching TV. Pain was on the left side of his chest and radiated to his neck. There was nausea and dyspnea alongside the pain.   He has been recovering well from his CABG. Walking daily. Avoiding heavy lifting.  He tells me that he has been coughing for the past few days. No fevers/chills. Pain came on suddenly in the left chest and went to his neck. The pain is now completely gone. No syncope/presyncope. No palpitations. No problems with medications.   Past Medical History:  Diagnosis Date   Aortic atherosclerosis ([place])    CT 06/2020   CAD (coronary artery disease)    NSTEMI S/p CABG 6/22 (L-LAD, S-LPDA/OM2 - Dr. Kipp Brood)   COPD (chronic obstructive pulmonary disease) ([place])    Hypercholesteremia    Hypertension    Prostate cancer [facility])    s/p resection   Thoracic aortic aneurysm ([place])    CT 5/22: 4.4 cm >> rpt in 1 year    Past Surgical History:  Procedure Laterality Date   ABDOMINAL SURGERY     "repair bleeding ulcers"   CORONARY ARTERY BYPASS GRAFT N/A 07/08/2020   Procedure: CORONARY ARTERY BYPASS GRAFTING (CABG) X  THREE ,ON PUMP, USING LEFT INTERNAL MAMMARY ARTERY AND RIGHT ENDOSPSCOPIC GREAT SAPHENOUS VEIN HARVEST CONDUITS;  Surgeon: Lajuana Matte, MD;  Location: [facility];  Service: Open Heart Surgery;  Laterality: N/A;   LEFT HEART CATH AND CORONARY ANGIOGRAPHY N/A 07/06/2020   Procedure: LEFT HEART CATH AND CORONARY ANGIOGRAPHY;  Surgeon:  Jettie Booze, MD;  Location: [facility];  Service: Cardiovascular;  Laterality: N/A;   NECK SURGERY     ROTATOR CUFF REPAIR     TEE WITHOUT CARDIOVERSION N/A 07/08/2020   Procedure: TRANSESOPHAGEAL ECHOCARDIOGRAM (TEE);  Surgeon: Lajuana Matte, MD;  Location: [facility];  Service: Open Heart Surgery;  Laterality: N/A;     Home Medications:  Prior to Admission medications   Medication Sig Start Date End Date Taking? Authorizing Provider  albuterol (PROVENTIL HFA;VENTOLIN HFA) 108 (90 BASE) MCG/ACT inhaler Inhale 1-2 puffs into the lungs every 6 (six) hours as needed for wheezing or shortness of breath.    [provider]  aspirin 81 MG EC tablet Take 1 tablet (81 mg total) by mouth daily. 07/13/20   Gold, Wayne E, PA-C  atorvastatin (LIPITOR) 80 MG tablet Take 1 tablet (80 mg total) by mouth at bedtime. 08/10/20 11/08/20  [name] T, PA-C  clopidogrel (PLAVIX) 75 MG tablet Take 1 tablet (75 mg total) by mouth daily. 08/10/20 08/10/21  [name] T, PA-C  ezetimibe (ZETIA) 10 MG tablet Take 1 tablet (10 mg total) by mouth daily. 08/24/20   [name] T, PA-C  metoprolol succinate (TOPROL XL) 25 MG 24 hr tablet Take 1 tablet (25 mg total) by mouth daily. 08/24/20   [name] T, PA-C  nitroGLYCERIN (NITROSTAT) 0.4 MG SL tablet Place 1 tablet (0.4 mg total) under  the tongue every 5 (five) minutes as needed for chest pain. 08/10/20 11/08/20  [name] T, PA-C  pantoprazole (PROTONIX) 40 MG tablet Take 1 tablet (40 mg total) by mouth daily. 08/11/20   [name] T, PA-C  tamsulosin (FLOMAX) 0.4 MG CAPS capsule Take 0.4 mg by mouth daily. 10/06/19   [provider]    Inpatient Medications: Scheduled Meds:  aspirin EC  81 mg Oral Daily   atorvastatin  80 mg Oral QHS   clopidogrel  75 mg Oral Daily   enoxaparin (LOVENOX) injection  40 mg Subcutaneous Daily   ezetimibe  10 mg Oral Daily   metoprolol succinate  25 mg Oral Daily   sodium chloride flush  3 mL  Intravenous Q12H   Continuous Infusions:  sodium chloride     PRN Meds: sodium chloride, acetaminophen **OR** acetaminophen, albuterol, nitroGLYCERIN, sodium chloride flush  Allergies:    Allergies  Allergen Reactions   Oxycodone Nausea And Vomiting   Pravastatin Itching    Other reaction(s): Xerostomia, Itching    Social History:   Social History   Socioeconomic History   Marital status: Legally Separated    Spouse name: Not on file   Number of children: Not on file   Years of education: Not on file   Highest education level: Not on file  Occupational History   Not on file  Tobacco Use   Smoking status: Every Day   Smokeless tobacco: Never  Substance and Sexual Activity   Alcohol use: No   Drug use: No   Sexual activity: Not on file  Other Topics Concern   Not on file  Social History Narrative   Not on file   Social Determinants of Health   Financial Resource Strain: Not on file  Food Insecurity: Not on file  Transportation Needs: Not on file  Physical Activity: Not on file  Stress: Not on file  Social Connections: Not on file  Intimate Partner Violence: Not on file    Family History:   History reviewed. No pertinent family history.   ROS:  Please see the history of present illness.   All other ROS reviewed and negative.     Physical Exam/Data:   Vitals:   09/05/20 0830 09/05/20 0845 09/05/20 0900 09/05/20 0915  BP: 121/78 124/89 (!) 141/85 132/90  Pulse: 70 67 76 65  Resp: '20 17 16 14  '$ Temp:      TempSrc:      SpO2: 93% 93% 98% 98%   No intake or output data in the 24 hours ending 09/05/20 1142 Last 3 Weights 08/20/2020 08/10/2020 07/13/2020  Weight (lbs) 183 lb 179 lb 6.4 oz 186 lb 15.2 oz  Weight (kg) 83.008 kg 81.375 kg 84.8 kg     There is no height or weight on file to calculate BMI.   General:  Well nourished, well developed, in no acute distress HEENT: normal Lymph: no adenopathy Neck: no JVD Endocrine:  No thryomegaly Vascular: No  carotid bruits; FA pulses 2+ bilaterally without bruits  Cardiac:  normal S1, S2; RRR; no murmur. Sternotomy healing. Lungs:  clear to auscultation bilaterally, no wheezing, rhonchi or rales  Abd: soft, nontender, no hepatomegaly  Ext: no edema Musculoskeletal:  No deformities, BUE and BLE strength normal and equal Skin: warm and dry  Neuro:  CNs 2-12 intact, no focal abnormalities noted Psych:  Normal affect   EKG:  The EKG was personally reviewed and demonstrates:  sinus rhythm. No ST/T wave changes c/w  acute ischemia.     Telemetry:  Telemetry was personally reviewed and demonstrates:  sinus rhythm  Relevant CV Studies:  07/08/2020 TEE intraop personally reviewed Normal LV function RV normal   Laboratory Data:  High Sensitivity Troponin:   Recent Labs  Lab 09/04/20 2201 09/04/20 2344 09/05/20 0307 09/05/20 0541  TROPONINIHS 19* 19* 19* 20*     Chemistry Recent Labs  Lab 09/04/20 2201 09/05/20 0307  NA 136 137  K 3.7 3.5  CL 101 102  CO2 29 28  GLUCOSE 100* 91  BUN 15 14  CREATININE 1.03 1.01  CALCIUM 8.7* 8.7*  GFRNONAA >60 >60  ANIONGAP 6 7    No results for input(s): PROT, ALBUMIN, AST, ALT, ALKPHOS, BILITOT in the last 168 hours. Hematology Recent Labs  Lab 09/04/20 2201 09/05/20 0307  WBC 5.9 4.1  RBC 3.97* 3.88*  HGB 10.0* 10.0*  HCT 32.8* 31.8*  MCV 82.6 82.0  MCH 25.2* 25.8*  MCHC 30.5 31.4  RDW 15.7* 15.9*  PLT 221 179   BNPNo results for input(s): BNP, PROBNP in the last 168 hours.  DDimer No results for input(s): DDIMER in the last 168 hours.   Radiology/Studies:  DG Chest 2 View  Result Date: 09/04/2020 CLINICAL DATA:  Chest pain. EXAM: CHEST - 2 VIEW COMPARISON:  08/20/2020 FINDINGS: The heart size and mediastinal contours are within normal limits. Prior CABG again noted. Both lungs are clear. The visualized skeletal structures are unremarkable. IMPRESSION: No active cardiopulmonary disease. Electronically Signed   By: Marlaine Hind M.D.   On: 09/04/2020 22:41     Assessment and Plan:   CAD s/p CABG 07/08/2020 No ischemic symptoms today. I do not suspect that his chest pain is cardiac in origin given the serially negative troponins.  For now, continue current cardiac medication regimen including aspirin, atorvastatin, plavix, zetia, metoprolol.  2. Covid19 infection I suspect this is more likely the cause of his chest pain given coughing with recent sternotomy. - management per IM  3. COPD   Cardiology will sign off. Please let us know if there are questions/concerns.   For questions or updates, please contact [place] Please consult [email or web address] for contact info under    Signed, Vickie Epley, MD  09/05/2020 11:42 AM

## 2020-09-08 ENCOUNTER — Ambulatory Visit: Payer: Medicare Other | Admitting: Cardiology

## 2020-10-04 NOTE — Progress Notes (Signed)
Patient ID: Blake Sutton                 DOB: February 03, 1946                      MRN: [id number]    HPI: Blake Sutton is a 75 y.o. male patient of Dr. Marlou Porch referred by [name], PA-C to HTN clinic. PMH is significant for NSTEMI s/p CABG 07/08/20 (L-LAD, S-LPDA/OM2), HFpEF (EF 55-60% 07/08/20), thoracic aortic aneurysm (4.4 cm on chest CTA 07/04/20), aortic atherosclerosis, HTN, HLD, COPD. Carotid US 07/07/20 showeded 1-39% stensosis of right and left carotid.   Last seen by [name], PA-C on 08/10/20 and BP was 79/40 (80/50 on repeat), metoprolol was held for hypotension. Upon work up, WBC was elevated, patient symptomatic for infection, and found to have UTI which he was appropriately treated for. BP returned to normal/elevated at last visit with pharmacy clinic on 7/19 along with HR 80-100s, so metoprolol succinate was resumed. At that visit, he was also started on ezetimibe due to LDL not <70 mg/dL on atorvastatin 80 mg and history recent CABG. Since last visit, patient presented to [facility] ED on 7/30 for evaluation of chest pain, diaphoresis, SOB. Found to be COVID+, chest pain felt to be noncardiac, was discharged home with Paxlovid.   Today, patient reports he is doing well since resuming metoprolol and starting ezetimibe after last visit. Denies any adverse effects. He recovered well from [place] and held his atorvastatin during treatment. He has already had his labs drawn today. Reports home BP readings are 110s/70s, most commonly around 116/74. Denies dizziness, headache, blurred vision, swelling. No changes in diet/exercise since last visit.   Current HTN meds: metoprolol succinate 25 mg daily Previously tried: metoprolol tartrate 12.5 mg BID (switched to once daily succinate formulation) BP goal: <130/80 mmHg  Current hyperlipidemia meds: atorvastatin 80 mg daily, ezetimibe 10 mg daily Previously tried: simvastatin 40 mg daily (switched to high-intensity statin s/p CABG), pravastatin  (xerostomia, itching) LDL goal: <70 mg/dL  Family History: Not available  Social History: Smokes a cigarette every now and then  Diet:  -Breakfast: cereal, bananas, peaches -Lunch: spaghetti and meatballs, chicken alfredo -Dinner: salad, tuna fish sandwich, something light -Snacks: raisin cake, honey bun -Drinks: soda twice a week, drinking more water lately  Exercise: Walks >1 mile/day  Home BP readings: 110s/70s, commonly 116/74  Labs:  07/04/20: TC 170, LDL 112, HDL 51, TG 35 (atorvastatin '80mg'$  daily)  Wt Readings from Last 3 Encounters:  08/20/20 183 lb (83 kg)  08/10/20 179 lb 6.4 oz (81.4 kg)  07/13/20 186 lb 15.2 oz (84.8 kg)   BP Readings from Last 3 Encounters:  10/05/20 132/78  09/05/20 (!) 147/123  08/24/20 132/88   Pulse Readings from Last 3 Encounters:  10/05/20 68  09/05/20 72  08/24/20 87    Renal function: CrCl cannot be calculated (Patient's most recent lab result is older than the maximum 21 days allowed.).  Past Medical History:  Diagnosis Date   Aortic atherosclerosis ([place])    CT 06/2020   CAD (coronary artery disease)    NSTEMI S/p CABG 6/22 (L-LAD, S-LPDA/OM2 - Dr. Kipp Brood)   COPD (chronic obstructive pulmonary disease) ([place])    Hypercholesteremia    Hypertension    Prostate cancer [facility])    s/p resection   Thoracic aortic aneurysm ([place])    CT 5/22: 4.4 cm >> rpt in 1 year    Current Outpatient Medications on  File Prior to Visit  Medication Sig Dispense Refill   albuterol (PROVENTIL HFA;VENTOLIN HFA) 108 (90 BASE) MCG/ACT inhaler Inhale 1-2 puffs into the lungs every 6 (six) hours as needed for wheezing or shortness of breath.     aspirin 81 MG EC tablet Take 1 tablet (81 mg total) by mouth daily.     atorvastatin (LIPITOR) 80 MG tablet Take 1 tablet (80 mg total) by mouth at bedtime for 1 day. Hold while on paxlovid 1 tablet 0   clopidogrel (PLAVIX) 75 MG tablet Take 1 tablet (75 mg total) by mouth daily. 90 tablet 3   ezetimibe  (ZETIA) 10 MG tablet Take 1 tablet (10 mg total) by mouth daily. 90 tablet 3   metoprolol succinate (TOPROL XL) 25 MG 24 hr tablet Take 1 tablet (25 mg total) by mouth daily. 90 tablet 3   nitroGLYCERIN (NITROSTAT) 0.4 MG SL tablet Place 1 tablet (0.4 mg total) under the tongue every 5 (five) minutes as needed for chest pain. 25 tablet 3   pantoprazole (PROTONIX) 40 MG tablet Take 1 tablet (40 mg total) by mouth daily. 30 tablet 1   tamsulosin (FLOMAX) 0.4 MG CAPS capsule Take 0.4 mg by mouth daily.     No current facility-administered medications on file prior to visit.    Allergies  Allergen Reactions   Oxycodone Nausea And Vomiting   Pravastatin Itching    Other reaction(s): Xerostomia, Itching     Assessment/Plan:  1. Hypertension - BP in clinic today is near goal <130/80 mmHg with all home readings at goal and HR in 60s since resuming metoprolol at last visit. Continue metoprolol succinate 25 mg daily. Continue checking BP at home and walking regularly. Confirmed appropriate home BP technique. F/u as needed.  2. Hyperlipidemia - Most recent LDL of 112 mg/dL is not at goal <70 mg/dL given recent NSTEMI and CABG. Checking lipid panel and LFTs today to assess efficacy of adding ezetimibe to high intensity atorvastatin at last visit. If LDL still not at goal, will add PCSK9i. Cost will be $47 per 30 day supply. Repatha and Praluent are both covered on his plan and require a PA. Discussed appropriate storage and administration technique. Patient is agreeable to starting this if LDL is not at goal. Prefers MyChart messaging to discuss results/medication changes. Will reach out tomorrow once labs result.   Rebbeca Paul, PharmD PGY2 Ambulatory Care Pharmacy Resident 10/05/2020 9:26 AM

## 2020-10-05 ENCOUNTER — Ambulatory Visit (INDEPENDENT_AMBULATORY_CARE_PROVIDER_SITE_OTHER): Payer: Medicare Other | Admitting: Student-PharmD

## 2020-10-05 ENCOUNTER — Other Ambulatory Visit: Payer: Self-pay

## 2020-10-05 ENCOUNTER — Other Ambulatory Visit: Payer: Medicare Other | Admitting: *Deleted

## 2020-10-05 VITALS — BP 132/78 | HR 68

## 2020-10-05 DIAGNOSIS — I10 Essential (primary) hypertension: Secondary | ICD-10-CM

## 2020-10-05 DIAGNOSIS — E785 Hyperlipidemia, unspecified: Secondary | ICD-10-CM

## 2020-10-05 LAB — HEPATIC FUNCTION PANEL
ALT: 15 IU/L (ref 0–44)
AST: 20 IU/L (ref 0–40)
Albumin: 4.1 g/dL (ref 3.7–4.7)
Alkaline Phosphatase: 110 IU/L (ref 44–121)
Bilirubin Total: 0.3 mg/dL (ref 0.0–1.2)
Bilirubin, Direct: <0.1 mg/dL (ref 0.00–0.40)
Total Protein: 6.3 g/dL (ref 6.0–8.5)

## 2020-10-05 LAB — LIPID PANEL
Chol/HDL Ratio: 2.5 ratio (ref 0.0–5.0)
Cholesterol, Total: 123 mg/dL (ref 100–199)
HDL: 49 mg/dL (ref >39)
LDL Chol Calc (NIH): 59 mg/dL (ref 0–99)
Triglycerides: 73 mg/dL (ref 0–149)
VLDL Cholesterol Cal: 15 mg/dL (ref 5–40)

## 2020-10-05 NOTE — Patient Instructions (Signed)
It was nice to see you today!  Your goal blood pressure is less than 130/80 mmHg. In clinic, your blood pressure was 132/78 mmHg.  Medication Changes: Continue metoprolol succinate 25 mg daily  Your goal LDL is <70 mg/dL. We checked your lipid panel today to see if it is now at goal. If it is not, we will need to add an injectable medication called Repatha. This is injected into the stomach every 14 days into your stomach and it is stored in the fridge.   I will message you through MyChart tomorrow regarding the results and next steps we need to take.   For now, continue atorvastatin 80 mg daily and ezetimibe 10 mg daily  Monitor blood pressure at home daily and keep a log (on your phone or piece of paper) to bring with you to your next visit. Write down date, time, blood pressure and pulse.  Keep up the good work with diet and exercise. Aim for a diet full of vegetables, fruit and lean meats (chicken, [place], fish). Try to limit salt intake by eating fresh or frozen vegetables (instead of canned), rinse canned vegetables prior to cooking and do not add any additional salt to meals.

## 2020-10-06 ENCOUNTER — Encounter: Payer: Self-pay | Admitting: Student-PharmD

## 2020-11-28 NOTE — Progress Notes (Signed)
Cardiology Office Note:    Date:  11/30/2020   ID:  Blake Sutton, DOB [birth year], MRN [id number]  PCP:  Serita Grammes, MD   [facility] Providers Cardiologist:  Candee Furbish, MD Cardiology APP:  [facility] [place]    Referring MD: Serita Grammes, MD   Chief Complaint:  Follow-up for CAD    Patient Profile:   Blake Sutton is a 75 y.o. male with:  Coronary artery disease  NSTEMI S/p CABG 6/22 (L-LAD, S-LPDA/OM2 - Dr. Kipp Brood) (HFpEF) heart failure with preserved ejection fraction  Thoracic aortic aneurysm CT 5/22: 4.4 cm Aortic atherosclerosis  Hypertension  Hyperlipidemia  COPD +cigs  Lung nodule CT 7/22: Right apical 0.9 x 0.7 cm partially calcified nodular lesion>> needs repeat CT 6-12 mos  History of Present Illness: [name] was last seen in clinic in 08/2020 for post hospital f/u.  His BP was quite low and f/u labs demonstrated leukocytosis.  He was sent to the ED and dx with a UTI.  He was placed on antibiotics and sent home.  He was seen again in the ED 7/30 with chest pain in the setting of COVID-19 infection.  He was seen by Dr. Quentin Ore for Cardiology and no further CV testing was recommended.  His hs-Trops were neg.  He has also seen the Lipid clinic and is now on Ezetimibe in addition to atorvastatin.  He returns for f/u.  He is here with his son.  He is doing well without chest pain, shortness of breath, syncope.  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  ASSESSMENT & PLAN:   CAD (coronary artery disease) Status post non-STEMI followed by CABG in 6/22.  He is overall doing well without anginal symptoms.  He requires dual antiplatelet therapy for 1 year post MI.  Continue current dose of aspirin 81 mg daily, clopidogrel 75 mg daily, atorvastatin 80 mg daily, metoprolol succinate 25 mg  daily.  I will have him follow-up with Dr. Marlou Porch in June which will be 1 year from his MI/bypass.  (HFpEF) heart failure with preserved ejection fraction ([place]) EF 55-60 on echocardiogram in 5/22.  NYHA II.  Volume status stable.  He does not require diuretic therapy.  Thoracic aortic aneurysm ([facility]) 4.4 cm by CT in 5/22.  He will have a repeat CT in 1 year.    Aortic atherosclerosis ([facility]) Continue ASA, statin Rx.   Hyperlipidemia LDL goal <70 Lab Results  Component Value Date/Time   LDLCALC 59 10/05/2020 08:57 AM  LDL is optimal.  Continue Atorvastatin 80 mg once daily, Ezetimibe 10 mg once daily.  Essential hypertension BP somewhat elevated.  Repeat by me is 130/86.  Continue to monitor at home.  Continue metoprolol succinate 25 mg daily.  Lung nodule He will have a follow-up chest CTA in May 2023 to reassess his aneurysm as well as his lung nodule.  We discussed the importance of quitting smoking.  Dispo:  Return in about 8 months (around 07/31/2021) for Routine Follow Up with Dr. Marlou Porch.    Prior CV studies: Carotid US 07/07/20 Bilateral ICA 1-39   LEFT HEART CATH 07/06/2020 LAD proximal 60, mid 50, 90 LCx mid 99; OM1 75 >> CABG   Echocardiogram 07/04/20 Inf-lat HK, EF 55-60, mild LVH, normal RVSF, RVSP 38.3, mod MR,  AV sclerosis w/o AS   Chest CTA 07/04/20 4.4 cm ascending thoracic aortic aneurysm   Past Medical History:  Diagnosis Date   Aortic atherosclerosis ([place])    CT 06/2020   CAD (coronary artery disease)    NSTEMI S/p CABG 6/22 (L-LAD, S-LPDA/OM2 - Dr. Kipp Brood)   COPD (chronic obstructive pulmonary disease) ([place])    Hypercholesteremia    Hypertension    Prostate cancer [facility])    s/p resection   Thoracic aortic aneurysm    CT 5/22: 4.4 cm >> rpt in 1 year   Current Medications: Current Meds  Medication Sig   albuterol (PROVENTIL HFA;VENTOLIN HFA) 108 (90 BASE) MCG/ACT inhaler Inhale 1-2 puffs into the lungs every 6 (six) hours as needed for wheezing or  shortness of breath.   aspirin 81 MG EC tablet Take 1 tablet (81 mg total) by mouth daily.   atorvastatin (LIPITOR) 80 MG tablet Take 1 tablet (80 mg total) by mouth at bedtime for 1 day. Hold while on paxlovid   clopidogrel (PLAVIX) 75 MG tablet Take 1 tablet (75 mg total) by mouth daily.   ezetimibe (ZETIA) 10 MG tablet Take 1 tablet (10 mg total) by mouth daily.   metoprolol succinate (TOPROL XL) 25 MG 24 hr tablet Take 1 tablet (25 mg total) by mouth daily.   nitroGLYCERIN (NITROSTAT) 0.4 MG SL tablet Place 1 tablet (0.4 mg total) under the tongue every 5 (five) minutes as needed for chest pain.   pantoprazole (PROTONIX) 40 MG tablet Take 1 tablet (40 mg total) by mouth daily.   tamsulosin (FLOMAX) 0.4 MG CAPS capsule Take 0.4 mg by mouth daily.    Allergies:   Oxycodone and Pravastatin   Social History   Tobacco Use   Smoking status: Every Day   Smokeless tobacco: Never  Substance Use Topics   Alcohol use: No   Drug use: No    Family Hx: The patient's family history is not on file.  Review of Systems  Gastrointestinal:  Negative for hematochezia.  Genitourinary:  Negative for hematuria.    EKGs/Labs/Other Test Reviewed:    EKG:  EKG is   ordered today.  The ekg ordered today demonstrates NSR, HR 66, normal axis, LVH, nonspecific-ST-T wave changes, QTC 440  Recent Labs: 07/04/2020: B Natriuretic Peptide 1,155.9 07/09/2020: Magnesium 2.3 09/05/2020: BUN 14; Creatinine, Ser 1.01; Hemoglobin 10.0; Platelets 179; Potassium 3.5; Sodium 137 10/05/2020: ALT 15   Recent Lipid Panel Lab Results  Component Value Date/Time   CHOL 123 10/05/2020 08:57 AM   TRIG 73 10/05/2020 08:57 AM   HDL 49 10/05/2020 08:57 AM   LDLCALC 59 10/05/2020 08:57 AM   Risk Assessment/Calculations:        Physical Exam:    VS:  BP (!) 158/80   Pulse 80   Ht 5' 10.5" (1.791 m)   Wt 198 lb 3.2 oz (89.9 kg)   SpO2 98%   BMI 28.04 kg/m     Wt Readings from Last 3 Encounters:  11/30/20 198 lb 3.2  oz (89.9 kg)  08/20/20 183 lb (83 kg)  08/10/20 179 lb 6.4 oz (81.4 kg)    Constitutional:      Appearance: Healthy appearance. Not in distress.  Neck:     Vascular: JVD normal.  Pulmonary:     Effort: Pulmonary effort is normal.     Breath sounds: No wheezing. No rales.  Cardiovascular:     Normal rate. Irregular rhythm. Normal S1. Normal S2.  Murmurs: There is no murmur.  Edema:    Peripheral edema absent.  Abdominal:     Palpations: Abdomen is soft. There is no hepatomegaly.  Skin:    General: Skin is warm and dry.  Neurological:     General: No focal deficit present.     Mental Status: Alert and oriented to person, place and time.     Cranial Nerves: Cranial nerves are intact.       Medication Adjustments/Labs and Tests Ordered: Current medicines are reviewed at length with the patient today.  Concerns regarding medicines are outlined above.  Tests Ordered: Orders Placed This Encounter  Procedures   CT ANGIO CHEST AORTA W/CM & OR WO/CM   Basic Metabolic Panel (BMET)   EKG 12-Lead    Medication Changes: No orders of the defined types were placed in this encounter.  Signed, [name], PA-C  11/30/2020 5:10 PM    [facility] [address], [place], [place]  [address] Phone: [phone]; Fax: [phone]

## 2020-11-28 NOTE — Assessment & Plan Note (Signed)
4.4 cm by CT in 5/22.  He will have a repeat CT in 1 year.

## 2020-11-28 NOTE — Assessment & Plan Note (Addendum)
BP somewhat elevated.  Repeat by me is 130/86.  Continue to monitor at home.  Continue metoprolol succinate 25 mg daily.

## 2020-11-28 NOTE — Assessment & Plan Note (Addendum)
EF 55-60 on echocardiogram in 5/22.  NYHA II.  Volume status stable.  He does not require diuretic therapy.

## 2020-11-28 NOTE — Assessment & Plan Note (Addendum)
Lab Results  Component Value Date/Time   LDLCALC 59 10/05/2020 08:57 AM  LDL is optimal.  Continue Atorvastatin 80 mg once daily, Ezetimibe 10 mg once daily.

## 2020-11-28 NOTE — Assessment & Plan Note (Addendum)
Status post non-STEMI followed by CABG in 6/22.  He is overall doing well without anginal symptoms.  He requires dual antiplatelet therapy for 1 year post MI.  Continue current dose of aspirin 81 mg daily, clopidogrel 75 mg daily, atorvastatin 80 mg daily, metoprolol succinate 25 mg daily.  I will have him follow-up with Dr. Marlou Porch in June which will be 1 year from his MI/bypass.

## 2020-11-28 NOTE — Assessment & Plan Note (Signed)
Continue ASA, statin Rx.  

## 2020-11-30 ENCOUNTER — Other Ambulatory Visit: Payer: Self-pay

## 2020-11-30 ENCOUNTER — Ambulatory Visit: Payer: Medicare Other | Admitting: Physician Assistant

## 2020-11-30 ENCOUNTER — Encounter: Payer: Self-pay | Admitting: Physician Assistant

## 2020-11-30 VITALS — BP 158/80 | HR 80 | Ht 70.5 in | Wt 198.2 lb

## 2020-11-30 DIAGNOSIS — I700 Atherosclerosis of aorta: Secondary | ICD-10-CM | POA: Diagnosis not present

## 2020-11-30 DIAGNOSIS — I5032 Chronic diastolic (congestive) heart failure: Secondary | ICD-10-CM | POA: Diagnosis not present

## 2020-11-30 DIAGNOSIS — E785 Hyperlipidemia, unspecified: Secondary | ICD-10-CM

## 2020-11-30 DIAGNOSIS — R911 Solitary pulmonary nodule: Secondary | ICD-10-CM | POA: Diagnosis not present

## 2020-11-30 DIAGNOSIS — I712 Thoracic aortic aneurysm, without rupture, unspecified: Secondary | ICD-10-CM | POA: Diagnosis not present

## 2020-11-30 DIAGNOSIS — I7121 Aneurysm of the ascending aorta, without rupture: Secondary | ICD-10-CM | POA: Diagnosis not present

## 2020-11-30 DIAGNOSIS — I2510 Atherosclerotic heart disease of native coronary artery without angina pectoris: Secondary | ICD-10-CM | POA: Diagnosis not present

## 2020-11-30 DIAGNOSIS — I10 Essential (primary) hypertension: Secondary | ICD-10-CM

## 2020-11-30 NOTE — Assessment & Plan Note (Signed)
He will have a follow-up chest CTA in May 2023 to reassess his aneurysm as well as his lung nodule.  We discussed the importance of quitting smoking.

## 2020-11-30 NOTE — Patient Instructions (Addendum)
Medication Instructions:   Your physician recommends that you continue on your current medications as directed. Please refer to the Current Medication list given to you today.  *If you need a refill on your cardiac medications before your next appointment, please call your pharmacy*   Lab Work:  You will need labs in May (2023) prior to your Chest CTA.  If you have labs (blood work) drawn today and your tests are completely normal, you will receive your results only by: [place] (if you have MyChart) OR A paper copy in the mail If you have any lab test that is abnormal or we need to change your treatment, we will call you to review the results.   Testing/Procedures:  Non-Cardiac CT Angiography (CTA), is a special type of CT scan that uses a computer to produce multi-dimensional views of major blood vessels throughout the body. In CT angiography, a contrast material is injected through an IV to help visualize the blood vessels. IN MAY (2023) TAA.    Follow-Up: At [facility], you and your health needs are our priority.  As part of our continuing mission to provide you with exceptional heart care, we have created designated Provider Care Teams.  These Care Teams include your primary Cardiologist (physician) and Advanced Practice Providers (APPs -  Physician Assistants and Nurse Practitioners) who all work together to provide you with the care you need, when you need it.  We recommend signing up for the patient portal called "MyChart".  Sign up information is provided on this After Visit Summary.  MyChart is used to connect with patients for Virtual Visits (Telemedicine).  Patients are able to view lab/test results, encounter notes, upcoming appointments, etc.  Non-urgent messages can be sent to your provider as well.   To learn more about what you can do with MyChart, go to [email or web address].    Your next appointment:   8 month(s)  The format for your next appointment:    In Person  Provider:   Candee Furbish, MD   Other Instructions  Your physician wants you to follow-up in: 8 months with Dr. Marlou Porch.  You will receive a reminder letter in the mail two months in advance. If you don't receive a letter, please call our office to schedule the follow-up appointment.  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

## 2020-12-09 ENCOUNTER — Ambulatory Visit: Payer: Medicare Other | Admitting: Cardiology

## 2020-12-13 DIAGNOSIS — N3000 Acute cystitis without hematuria: Secondary | ICD-10-CM | POA: Diagnosis not present

## 2020-12-13 DIAGNOSIS — N3090 Cystitis, unspecified without hematuria: Secondary | ICD-10-CM | POA: Diagnosis not present

## 2021-06-20 DIAGNOSIS — J069 Acute upper respiratory infection, unspecified: Secondary | ICD-10-CM | POA: Diagnosis not present

## 2021-06-20 DIAGNOSIS — R051 Acute cough: Secondary | ICD-10-CM | POA: Diagnosis not present

## 2021-06-20 DIAGNOSIS — F17200 Nicotine dependence, unspecified, uncomplicated: Secondary | ICD-10-CM | POA: Diagnosis not present

## 2021-06-30 ENCOUNTER — Ambulatory Visit
Admission: RE | Admit: 2021-06-30 | Discharge: 2021-06-30 | Disposition: A | Discharge status: Home / Self Care | Payer: Medicare Other | Source: Ambulatory Visit | Attending: Physician Assistant | Admitting: Physician Assistant

## 2021-06-30 DIAGNOSIS — J439 Emphysema, unspecified: Secondary | ICD-10-CM | POA: Diagnosis not present

## 2021-06-30 DIAGNOSIS — I2510 Atherosclerotic heart disease of native coronary artery without angina pectoris: Secondary | ICD-10-CM | POA: Diagnosis not present

## 2021-06-30 DIAGNOSIS — I712 Thoracic aortic aneurysm, without rupture, unspecified: Secondary | ICD-10-CM

## 2021-06-30 DIAGNOSIS — K760 Fatty (change of) liver, not elsewhere classified: Secondary | ICD-10-CM | POA: Diagnosis not present

## 2021-06-30 MED ORDER — IOPAMIDOL (ISOVUE-370) INJECTION 76%
75.0000 mL | Freq: Once | INTRAVENOUS | Status: AC | PRN
  Administered 2021-06-30: 75 mL via INTRAVENOUS

## 2021-07-22 ENCOUNTER — Other Ambulatory Visit: Payer: Self-pay

## 2021-07-22 MED ORDER — ATORVASTATIN CALCIUM 80 MG PO TABS: 80.0000 mg | ORAL_TABLET | Freq: Every day | ORAL | 1 refills | Status: DC

## 2021-07-29 ENCOUNTER — Encounter: Payer: Self-pay | Admitting: Cardiology

## 2021-08-01 ENCOUNTER — Other Ambulatory Visit: Payer: Self-pay | Admitting: Physician Assistant

## 2021-08-24 DIAGNOSIS — H5203 Hypermetropia, bilateral: Secondary | ICD-10-CM | POA: Diagnosis not present

## 2021-08-29 ENCOUNTER — Ambulatory Visit: Payer: Medicare Other | Admitting: Cardiology

## 2021-08-29 ENCOUNTER — Encounter: Payer: Self-pay | Admitting: Cardiology

## 2021-08-29 ENCOUNTER — Encounter: Payer: Self-pay | Admitting: *Deleted

## 2021-08-29 VITALS — BP 170/90 | HR 77 | Ht 70.5 in | Wt 213.0 lb

## 2021-08-29 DIAGNOSIS — I10 Essential (primary) hypertension: Secondary | ICD-10-CM | POA: Diagnosis not present

## 2021-08-29 DIAGNOSIS — I2510 Atherosclerotic heart disease of native coronary artery without angina pectoris: Secondary | ICD-10-CM | POA: Diagnosis not present

## 2021-08-29 NOTE — Patient Instructions (Signed)
Medication Instructions:  Please discontinue your Plavix (Clopidogrel) Continue all other medications as listed.  *If you need a refill on your cardiac medications before your next appointment, please call your pharmacy*  Testing/Procedures: Your physician has requested that you have a lexiscan myoview. For further information please visit [email or web address]. Please follow instruction sheet, as given.  Follow-Up: At [facility], you and your health needs are our priority.  As part of our continuing mission to provide you with exceptional heart care, we have created designated Provider Care Teams.  These Care Teams include your primary Cardiologist (physician) and Advanced Practice Providers (APPs -  Physician Assistants and Nurse Practitioners) who all work together to provide you with the care you need, when you need it.  We recommend signing up for the patient portal called "MyChart".  Sign up information is provided on this After Visit Summary.  MyChart is used to connect with patients for Virtual Visits (Telemedicine).  Patients are able to view lab/test results, encounter notes, upcoming appointments, etc.  Non-urgent messages can be sent to your provider as well.   To learn more about what you can do with MyChart, go to [email or web address].    Your next appointment:   6 month(s)  The format for your next appointment:   In Person  Provider:   [name], PA-C     Then, Candee Furbish, MD will plan to see you again in 1 year(s).{    Important Information About Sugar

## 2021-08-29 NOTE — Progress Notes (Signed)
Cardiology Office Note:    Date:  08/29/2021   ID:  Blake Sutton, DOB 1945-12-03, MRN [id number]  PCP:  Serita Grammes, MD   [facility] Providers Cardiologist:  Candee Furbish, MD Cardiology APP:  Sharmon Revere     Referring MD: Serita Grammes, MD    History of Present Illness:    Blake Sutton is a 76 y.o. male here for the follow-up of coronary artery disease  CABG June 2022-LIMA to LAD, SVG to PDA and OM 2 Dr. Kipp Brood Thoracic aortic aneurysm-CT 06/2020 4.4 cm, CT 06/30/2021-4.3 cm, Aortic atherosclerosis Hypertension Hyperlipidemia COPD Cigarette smoker Lung nodule 08/2020 right apical 0.9 cm repeat scan, 06/30/2021-stable 9 mm right lung nodule  Seen last by Blake Sutton.  He was seen by Dr. Quentin Sutton at 1 point.  No further cardiac testing was recommended.  Troponins were negative.  Lipid clinic helped him with Zetia in addition to atorvastatin's.  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  Overall feeling well without any chest pain fevers chills nausea vomiting shortness of breath.  His son is here with him.  Unfortunately his son also had triple-vessel disease and has an EF of about 20%.  Had bypass himself.    Past Medical History:  Diagnosis Date   Aortic atherosclerosis ([place])    CT 06/2020   CAD (coronary artery disease)    NSTEMI S/p CABG 6/22 (L-LAD, S-LPDA/OM2 - Dr. Kipp Brood)   COPD (chronic obstructive pulmonary disease) ([place])    Hypercholesteremia    Hypertension    Prostate cancer [facility])    s/p resection   Thoracic aortic aneurysm ([place])    CT 5/22: 4.4 cm >> rpt in 1 year    Past Surgical History:  Procedure Laterality Date   ABDOMINAL SURGERY     "repair bleeding ulcers"   CORONARY ARTERY BYPASS GRAFT N/A 07/08/2020   Procedure: CORONARY ARTERY BYPASS GRAFTING (CABG) X  THREE ,ON PUMP, USING LEFT INTERNAL MAMMARY ARTERY AND RIGHT ENDOSPSCOPIC GREAT SAPHENOUS VEIN HARVEST CONDUITS;  Surgeon:  Blake Matte, MD;  Location: [place];  Service: Open Heart Surgery;  Laterality: N/A;   LEFT HEART CATH AND CORONARY ANGIOGRAPHY N/A 07/06/2020   Procedure: LEFT HEART CATH AND CORONARY ANGIOGRAPHY;  Surgeon: Blake Booze, MD;  Location: [place] [facility];  Service: Cardiovascular;  Laterality: N/A;   NECK SURGERY     ROTATOR CUFF REPAIR     TEE WITHOUT CARDIOVERSION N/A 07/08/2020   Procedure: TRANSESOPHAGEAL ECHOCARDIOGRAM (TEE);  Surgeon: Blake Matte, MD;  Location: [place];  Service: Open Heart Surgery;  Laterality: N/A;    Current Medications: Current Meds  Medication Sig   albuterol (PROVENTIL HFA;VENTOLIN HFA) 108 (90 BASE) MCG/ACT inhaler Inhale 1-2 puffs into the lungs every 6 (six) hours as needed for wheezing or shortness of breath.   aspirin 81 MG EC tablet Take 1 tablet (81 mg total) by mouth daily.   atorvastatin (LIPITOR) 80 MG tablet TAKE 1 TABLET BY MOUTH AT BEDTIME   ezetimibe (ZETIA) 10 MG tablet Take 1 tablet by mouth once daily   metoprolol succinate (TOPROL-XL) 25 MG 24 hr tablet Take 1 tablet by mouth once daily   nitroGLYCERIN (NITROSTAT) 0.4 MG SL tablet Place 1 tablet (0.4 mg total) under the tongue every 5 (five) minutes as needed for chest pain.   pantoprazole (PROTONIX) 40 MG tablet Take 1 tablet (40 mg total) by mouth daily.   tamsulosin (  FLOMAX) 0.4 MG CAPS capsule Take 0.4 mg by mouth daily.   [DISCONTINUED] clopidogrel (PLAVIX) 75 MG tablet Take 1 tablet by mouth once daily     Allergies:   Oxycodone and Pravastatin   Social History   Socioeconomic History   Marital status: Legally Separated    Spouse name: Not on file   Number of children: Not on file   Years of education: Not on file   Highest education level: Not on file  Occupational History   Not on file  Tobacco Use   Smoking status: Every Day   Smokeless tobacco: Never  Substance and Sexual Activity   Alcohol use: No   Drug use: No   Sexual activity: Not on file   Other Topics Concern   Not on file  Social History Narrative   Not on file   Social Determinants of Health   Financial Resource Strain: Not on file  Food Insecurity: Not on file  Transportation Needs: Not on file  Physical Activity: Not on file  Stress: Not on file  Social Connections: Not on file     Family History: The patient's family history is not on file.  ROS:   Please see the history of present illness.     All other systems reviewed and are negative.  EKGs/Labs/Other Studies Reviewed:    The following studies were reviewed today: As above, CT scan  EKG:  EKG is  ordered today.  The ekg ordered today demonstrates sinus rhythm 66 possible left atrial enlargement  Recent Labs: 09/05/2020: BUN 14; Creatinine, Ser 1.01; Hemoglobin 10.0; Platelets 179; Potassium 3.5; Sodium 137 10/05/2020: ALT 15  Recent Lipid Panel    Component Value Date/Time   CHOL 123 10/05/2020 0857   TRIG 73 10/05/2020 0857   HDL 49 10/05/2020 0857   CHOLHDL 2.5 10/05/2020 0857   CHOLHDL 3.3 07/04/2020 0456   VLDL 7 07/04/2020 0456   LDLCALC 59 10/05/2020 0857     Risk Assessment/Calculations:              Physical Exam:    VS:  BP (!) 170/90 (BP Location: Left Arm, Patient Position: Sitting, Cuff Size: Normal)   Pulse 77   Ht 5' 10.5" (1.791 m)   Wt 213 lb (96.6 kg)   SpO2 96%   BMI 30.13 kg/m     Wt Readings from Last 3 Encounters:  08/29/21 213 lb (96.6 kg)  11/30/20 198 lb 3.2 oz (89.9 kg)  08/20/20 183 lb (83 kg)     GEN:  Well nourished, well developed in no acute distress HEENT: Normal NECK: No JVD; No carotid bruits LYMPHATICS: No lymphadenopathy CARDIAC: RRR, no murmurs, no rubs, gallops, CABG scar RESPIRATORY:  Clear to auscultation without rales, wheezing or rhonchi  ABDOMEN: Soft, non-tender, non-distended MUSCULOSKELETAL:  No edema; No deformity  SKIN: Warm and dry NEUROLOGIC:  Alert and oriented x 3 PSYCHIATRIC:  Normal affect   ASSESSMENT:    1.  Coronary artery disease involving native coronary artery of native heart without angina pectoris   2. Essential hypertension    PLAN:    In order of problems listed above:    CAD post CABG - Doing well without any anginal symptoms.  Continue with goal-directed medical therapy.  Aspirin 81 mg, Plavix 75 mg.  We will go ahead and stop his Plavix.  Has been over 1 year.  Okay to take aspirin 81 mg lifelong.  Hyperlipidemia - Continue with Zetia as well as atorvastatin 80  mg.  Last LDL 59.  Excellent.  DOT physical - I will go ahead and check a pharmacologic stress test to ensure that he does not have any evidence of high risk ischemia.  We will check an EKG today as well.  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      Medication Adjustments/Labs and Tests Ordered: Current medicines are reviewed at length with the patient today.  Concerns regarding medicines are outlined above.  Orders Placed This Encounter  Procedures   MYOCARDIAL PERFUSION IMAGING   EKG 12-Lead   No orders of the defined types were placed in this encounter.   Patient Instructions  Medication Instructions:  Please discontinue your Plavix (Clopidogrel) Continue all other medications as listed.  *If you need a refill on your cardiac medications before your next appointment, please call your pharmacy*  Testing/Procedures: Your physician has requested that you have a lexiscan myoview. For further information please visit [email or web address]. Please follow instruction sheet, as given.  Follow-Up: At [facility], you and your health needs are our priority.  As part of our continuing mission to provide you with exceptional heart care, we have created designated Provider Care Teams.  These Care Teams include your primary Cardiologist (physician) and Advanced Practice Providers (APPs -  Physician Assistants and Nurse Practitioners) who all work  together to provide you with the care you need, when you need it.  We recommend signing up for the patient portal called "MyChart".  Sign up information is provided on this After Visit Summary.  MyChart is used to connect with patients for Virtual Visits (Telemedicine).  Patients are able to view lab/test results, encounter notes, upcoming appointments, etc.  Non-urgent messages can be sent to your provider as well.   To learn more about what you can do with MyChart, go to [email or web address].    Your next appointment:   6 month(s)  The format for your next appointment:   In Person  Provider:   [name], PA-C     Then, Candee Furbish, MD will plan to see you again in 1 year(s).{    Important Information About Sugar         Signed, Candee Furbish, MD  08/29/2021 5:06 PM    [facility]

## 2021-08-30 ENCOUNTER — Telehealth (HOSPITAL_COMMUNITY): Payer: Self-pay

## 2021-08-30 NOTE — Telephone Encounter (Signed)
Detailed instructions left on the patient's answering machine. Asked to call back with any questions. S.Yasseen Salls EMTP 

## 2021-09-01 ENCOUNTER — Ambulatory Visit (HOSPITAL_COMMUNITY): Payer: Medicare Other | Attending: Cardiology

## 2021-09-01 DIAGNOSIS — I2510 Atherosclerotic heart disease of native coronary artery without angina pectoris: Secondary | ICD-10-CM | POA: Diagnosis not present

## 2021-09-01 DIAGNOSIS — I10 Essential (primary) hypertension: Secondary | ICD-10-CM | POA: Insufficient documentation

## 2021-09-01 LAB — MYOCARDIAL PERFUSION IMAGING
LV dias vol: 120 mL (ref 62–150)
LV sys vol: 61 mL
Nuc Stress EF: 49 %
Peak HR: 75 {beats}/min
Rest HR: 55 {beats}/min
Rest Nuclear Isotope Dose: 9.6 mCi
SDS: 0
SRS: 0
SSS: 0
ST Depression (mm): 0 mm
Stress Nuclear Isotope Dose: 30.1 mCi
TID: 0.95

## 2021-09-01 MED ORDER — REGADENOSON 0.4 MG/5ML IV SOLN
0.4000 mg | Freq: Once | INTRAVENOUS | Status: AC
  Administered 2021-09-01: 0.4 mg via INTRAVENOUS

## 2021-09-01 MED ORDER — TECHNETIUM TC 99M TETROFOSMIN IV KIT
9.6000 | PACK | Freq: Once | INTRAVENOUS | Status: AC | PRN
Start: 2021-09-01 — End: 2021-09-01
  Administered 2021-09-01: 9.6 via INTRAVENOUS

## 2021-09-01 MED ORDER — TECHNETIUM TC 99M TETROFOSMIN IV KIT
30.1000 | PACK | Freq: Once | INTRAVENOUS | Status: AC | PRN
Start: 2021-09-01 — End: 2021-09-01
  Administered 2021-09-01: 30.1 via INTRAVENOUS

## 2021-09-12 ENCOUNTER — Ambulatory Visit: Payer: Medicare Other | Admitting: Cardiology

## 2021-09-26 ENCOUNTER — Encounter: Payer: Self-pay | Admitting: Cardiology

## 2021-11-08 ENCOUNTER — Telehealth: Payer: Self-pay

## 2021-11-08 ENCOUNTER — Other Ambulatory Visit: Payer: Self-pay | Admitting: Cardiology

## 2021-11-08 NOTE — Patient Instructions (Signed)
Visit Information  Thank you for taking time to speak with me today. Please do not hesitate to call if you require additional assistance.  Following are the goals we discussed today:   Goals Addressed             This Visit's Progress    Health Maintenance       Care Coordination Interventions: Reviewed plan for disease management. Reports adhering to treatment plans. Reports completing follow up with Cardiology team as scheduled. Reviewed medications. Reports managing well. Denies concerns r/t medication management or prescription cost. Discussed need for new order for pantoprazole. Reports this medication was previously being ordered by his provider at the [facility]. Reports no longer being followed by the [place]. Currently receiving primary care services at [facility]. Will follow up with the clinic regarding order. Assessed social determinant of health barriers.        Mr. Karwowski verbalized understanding of the information discussed during the telephonic outreach. Declined need for mailed instructions or resources.  Will contact the PCP office regarding order for pantoprazole and update Mr. Moffa this week.  [place] [phone]

## 2021-11-08 NOTE — Patient Outreach (Signed)
  Care Coordination   Initial Visit Note   11/08/2021 Name: PRATEEK KNIPPLE MRN: [id number] DOB: 12/14/1945  Ofilia Neas is a 76 y.o. year old male who sees Serita Grammes, MD for primary care. I spoke with  Ofilia Neas by phone today.  What matters to the patients health and wellness today?  Health Maintenance   Goals Addressed             This Visit's Progress    Health Maintenance       Care Coordination Interventions: Reviewed plan for disease management. Reports adhering to treatment plans. Reports completing follow up with Cardiology team as scheduled. Reviewed medications. Reports managing well. Denies concerns r/t medication management or prescription cost. Discussed need for new order for pantoprazole. Reports this medication was previously being ordered by his provider at the [facility]. Reports no longer being followed by the [place]. Currently receiving primary care services at [place]. Will follow up with the clinic regarding order. Assessed social determinant of health barriers.         SDOH assessments and interventions completed:  Yes  SDOH Interventions Today    Flowsheet Row Most Recent Value  SDOH Interventions   Food Insecurity Interventions Intervention Not Indicated  Transportation Interventions Intervention Not Indicated        Care Coordination Interventions Activated:  Yes  Care Coordination Interventions:  Yes, provided   Follow up plan:  Will follow up with PCP office regarding order for pantoprazole.    Encounter Outcome:  Pt. Visit Completed   [facility] [phone]

## 2022-01-04 ENCOUNTER — Other Ambulatory Visit: Payer: Self-pay | Admitting: Cardiology

## 2022-01-04 DIAGNOSIS — L02212 Cutaneous abscess of back [any part, except buttock]: Secondary | ICD-10-CM | POA: Diagnosis not present

## 2022-01-08 DIAGNOSIS — L0291 Cutaneous abscess, unspecified: Secondary | ICD-10-CM | POA: Diagnosis not present

## 2022-01-08 DIAGNOSIS — L02212 Cutaneous abscess of back [any part, except buttock]: Secondary | ICD-10-CM | POA: Diagnosis not present

## 2022-03-05 NOTE — Progress Notes (Deleted)
Cardiology Office Note:    Date:  03/05/2022   ID:  Blake Sutton, DOB 15-Nov-1945, MRN [id number]  PCP:  Serita Grammes, MD  [place] Providers Cardiologist:  Candee Furbish, MD Cardiology APP:  Liliane Shi, PA-C { Click to update primary MD,subspecialty MD or APP then REFRESH:1}  *** Referring MD: Serita Grammes, MD   Chief Complaint:  No chief complaint on file. {Click here for Visit Info    :1}   Patient Profile: Coronary artery disease  NSTEMI S/p CABG 6/22 (L-LAD, S-LPDA/OM2 - Dr. Kipp Brood) (HFpEF) heart failure with preserved ejection fraction  Thoracic aortic aneurysm CT 5/22: 4.4 cm Aortic atherosclerosis  Hypertension  Hyperlipidemia  COPD +cigs  Lung nodule CT 7/22: Right apical 0.9 x 0.7 cm partially calcified nodular lesion>> needs repeat CT 6-12 mos  Chest/aorta CTA 06/30/2021: Ascending thoracic aorta 4.3 cm  Myoview 09/01/2021: Normal perfusion, EF 49, no ischemia or infarction, low risk  Carotid US 07/07/20 Bilateral ICA 1-39   LEFT HEART CATH 07/06/2020 LAD proximal 60, mid 50, 90 LCx mid 99; OM1 75 >> CABG   Echocardiogram 07/04/20 Inf-lat HK, EF 55-60, mild LVH, normal RVSF, RVSP 38.3, mod MR, AV sclerosis w/o AS   Chest CTA 07/04/20 4.4 cm ascending thoracic aortic aneurysm     History of Present Illness:   Blake Sutton is a 77 y.o. male with the above problem list.  He was last seen by Dr. Marlou Porch 08/29/2021.  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  This was low risk.  He returns for follow-up of coronary artery disease.***  {EKG done?:28833::"EKG: ***"}     Reviewed and updated this encounter:***     ROS  Labs/Other Test Reviewed:   Recent Labs: No results found for requested labs within last 365 days.   Recent Lipid Panel No results for input(s): "CHOL", "TRIG", "HDL", "VLDL", "LDLCALC", "LDLDIRECT" in the last 8760 hours.   Risk Assessment/Calculations/Metrics:   {Does this patient have ATRIAL  FIBRILLATION?:[id number]}     No BP recorded.  {Refresh Note OR Click here to enter BP  :1}***   Physical Exam:   VS:  There were no vitals taken for this visit.   Wt Readings from Last 3 Encounters:  09/01/21 213 lb (96.6 kg)  08/29/21 213 lb (96.6 kg)  11/30/20 198 lb 3.2 oz (89.9 kg)    Physical Exam ***     ASSESSMENT & PLAN:   No problem-specific Assessment & Plan notes found for this encounter.  {  CAD (coronary artery disease) Status post non-STEMI followed by CABG in 6/22.  He is overall doing well without anginal symptoms.  He requires dual antiplatelet therapy for 1 year post MI.  Continue current dose of aspirin 81 mg daily, clopidogrel 75 mg daily, atorvastatin 80 mg daily, metoprolol succinate 25 mg daily.  I will have him follow-up with Dr. Marlou Porch in June which will be 1 year from his MI/bypass.   (HFpEF) heart failure with preserved ejection fraction ([place]) EF 55-60 on echocardiogram in 5/22.  NYHA II.  Volume status stable.  He does not require diuretic therapy.   Thoracic aortic aneurysm (HCC) 4.4 cm by CT in 5/22.  He will have a repeat CT in 1 year.     Aortic atherosclerosis (HCC) Continue ASA, statin Rx.    Hyperlipidemia LDL goal <70      Lab Results  Component Value Date/Time    LDLCALC 59 10/05/2020 08:57 AM  LDL  is optimal.  Continue Atorvastatin 80 mg once daily, Ezetimibe 10 mg once daily.   Essential hypertension BP somewhat elevated.  Repeat by me is 130/86.  Continue to monitor at home.  Continue metoprolol succinate 25 mg daily.   Lung nodule He will have a follow-up chest CTA in May 2023 to reassess his aneurysm as well as his lung nodule.  We discussed the importance of quitting smoking.    :1}       {Are you ordering a CV Procedure (e.g. stress test, cath, DCCV, TEE, etc)?   Press F2        :[id number]   Dispo:  No follow-ups on file.   {Medication Adjustments/Labs and Tests Ordered: Current medicines are reviewed at length with the patient  today.  Concerns regarding medicines are outlined above.  Tests Ordered: No orders of the defined types were placed in this encounter.  Medication Changes: No orders of the defined types were placed in this encounter.       :1}    Signed, [name], PA-C  03/05/2022 8:47 AM    [facility] [address], [place], [place]  [address] Phone: [phone]; Fax: [phone]

## 2022-03-06 ENCOUNTER — Ambulatory Visit: Payer: Medicare Other | Admitting: Physician Assistant

## 2022-03-06 DIAGNOSIS — I2510 Atherosclerotic heart disease of native coronary artery without angina pectoris: Secondary | ICD-10-CM

## 2022-04-05 ENCOUNTER — Other Ambulatory Visit: Payer: Self-pay | Admitting: Cardiology

## 2022-04-13 NOTE — Progress Notes (Signed)
Cardiology Office Note:    Date:  04/14/2022  ID:  Blake Sutton, DOB 09/03/1945, MRN [id number] [place] Providers Cardiologist:  Candee Furbish, MD Cardiology APP:  Liliane Shi, PA-C      Patient Profile:   Coronary artery disease  NSTEMI S/p CABG 6/22 (L-LAD, S-LPDA/OM2 - Dr. Kipp Brood) TTE 07/04/20: Inf-lat HK, EF 55-60, mild LVH, normal RVSF, RVSP 38.3, mod MR, AV sclerosis w/o AS  Myoview 09/01/21: EF 49, no ischemia or infarction; low risk  (HFpEF) heart failure with preserved ejection fraction  Thoracic aortic aneurysm CT 06/30/21: Ascending Thoracic Aorta 4.3 cm  Aortic atherosclerosis  Hypertension  Hyperlipidemia  COPD +cigs  Carotid US 07/07/20: Bilat ICA 1-39 Lung nodule CT 06/2021: R apical nodule 9 mm (stable)     History of Present Illness:   Blake Sutton is a 77 y.o. male who returns for f/u on CAD. He was last seen by Dr. Marlou Porch 08/29/21.  He is here with his son.  He notes significant issues with dyspepsia.  He has had significant issues with this in the past.  It occurs with eating spicy foods or drinking coffee or eating cucumbers.  He has worse symptoms when he lays down.  He ran out of pantoprazole sometime ago.  He has only been taking Tums or Rolaids with minimal relief.  Of note, he has a history of esophageal stricture and has had dilatation in the past at the [place].  He has also had gastric surgery for peptic ulcer disease.  He has not had exertional chest discomfort.  He has not had exertional shortness of breath, syncope, orthopnea, leg edema.  Review of Systems  Constitutional: Negative for fever.  Respiratory:  Negative for cough.   Gastrointestinal:  Positive for heartburn. Negative for diarrhea, hematemesis, hematochezia and melena.  Genitourinary:  Negative for hematuria.  Psychiatric/Behavioral:         +stress - taking care of mother with dementia   see the HPI    Studies Reviewed:    EKG:  not done  Risk Assessment/Calculations:              Physical Exam:   VS:  BP 100/70   Pulse 68   Ht '5\' 10"'$  (1.778 m)   Wt 198 lb 6.4 oz (90 kg)   SpO2 98%   BMI 28.47 kg/m    Wt Readings from Last 3 Encounters:  04/14/22 198 lb 6.4 oz (90 kg)  09/01/21 213 lb (96.6 kg)  08/29/21 213 lb (96.6 kg)    Constitutional:      Appearance: Healthy appearance. Not in distress.  Neck:     Vascular: JVD normal.  Pulmonary:     Breath sounds: Normal breath sounds. No wheezing. No rales.  Cardiovascular:     Normal rate. Regular rhythm. Normal S1. Normal S2.      Murmurs: There is no murmur.  Edema:    Peripheral edema absent.  Abdominal:     Palpations: Abdomen is soft.     Tenderness: There is no abdominal tenderness.        ASSESSMENT AND PLAN:   CAD (coronary artery disease) Status post CABG in 2022 in the setting of non-STEMI.  Myoview in July 2023 was low risk.  He is doing well without anginal symptoms.  Continue aspirin 81 mg daily, Lipitor 80 mg daily, Toprol-XL 25 mg daily.  Follow-up 6 months.  GERD (gastroesophageal reflux disease) He has significant symptoms of acid reflux disease.  He has had dilation for esophageal stricture in the past as well as gastric surgery for peptic ulcer disease.  I will start him back on Protonix 40 mg daily.  I will also refer him to gastroenterology for further evaluation and management.  Obtain CBC, CMET.  Thoracic aortic aneurysm (HCC) 4.3 cm by CT in May 2023.  Repeat CT due in May 2024.  Essential hypertension The patient's blood pressure is controlled on his current regimen.  Continue current Rx with Toprol-XL 25 mg daily.   Hyperlipidemia LDL goal <70 Continue Lipitor 80 mg, Zetia 10 mg.  Obtain CMET, lipids today.  Aortic atherosclerosis (HCC) Continue aspirin, statin.  (HFpEF) heart failure with preserved ejection fraction (HCC) No clinical symptoms.  He does not require diuretic therapy.  Continue current management.  Tobacco use I have recommended cessation.   We discussed the benefits of smoking cessation in patients with known coronary artery disease.      Dispo:  Return in about 6 months (around 10/15/2022) for Routine Follow Up with Dr. Marlou Porch.  Signed, [name], PA-C

## 2022-04-14 ENCOUNTER — Ambulatory Visit: Payer: Medicare Other | Attending: Physician Assistant | Admitting: Physician Assistant

## 2022-04-14 ENCOUNTER — Encounter: Payer: Self-pay | Admitting: Physician Assistant

## 2022-04-14 VITALS — BP 100/70 | HR 68 | Ht 70.0 in | Wt 198.4 lb

## 2022-04-14 DIAGNOSIS — I712 Thoracic aortic aneurysm, without rupture, unspecified: Secondary | ICD-10-CM

## 2022-04-14 DIAGNOSIS — Z720 Tobacco use: Secondary | ICD-10-CM | POA: Diagnosis not present

## 2022-04-14 DIAGNOSIS — I700 Atherosclerosis of aorta: Secondary | ICD-10-CM | POA: Diagnosis not present

## 2022-04-14 DIAGNOSIS — I2510 Atherosclerotic heart disease of native coronary artery without angina pectoris: Secondary | ICD-10-CM

## 2022-04-14 DIAGNOSIS — I10 Essential (primary) hypertension: Secondary | ICD-10-CM

## 2022-04-14 DIAGNOSIS — K219 Gastro-esophageal reflux disease without esophagitis: Secondary | ICD-10-CM | POA: Diagnosis not present

## 2022-04-14 DIAGNOSIS — E785 Hyperlipidemia, unspecified: Secondary | ICD-10-CM | POA: Diagnosis not present

## 2022-04-14 DIAGNOSIS — I5032 Chronic diastolic (congestive) heart failure: Secondary | ICD-10-CM

## 2022-04-14 MED ORDER — PANTOPRAZOLE SODIUM 40 MG PO TBEC: 40.0000 mg | DELAYED_RELEASE_TABLET | Freq: Every day | ORAL | 1 refills | Status: DC

## 2022-04-14 NOTE — Assessment & Plan Note (Signed)
Status post CABG in 2022 in the setting of non-STEMI.  Myoview in July 2023 was low risk.  He is doing well without anginal symptoms.  Continue aspirin 81 mg daily, Lipitor 80 mg daily, Toprol-XL 25 mg daily.  Follow-up 6 months.

## 2022-04-14 NOTE — Assessment & Plan Note (Signed)
Continue Lipitor 80 mg, Zetia 10 mg.  Obtain CMET, lipids today.

## 2022-04-14 NOTE — Assessment & Plan Note (Signed)
4.3 cm by CT in May 2023.  Repeat CT due in May 2024.

## 2022-04-14 NOTE — Assessment & Plan Note (Signed)
Continue aspirin, statin.  

## 2022-04-14 NOTE — Assessment & Plan Note (Signed)
I have recommended cessation.  We discussed the benefits of smoking cessation in patients with known coronary artery disease.

## 2022-04-14 NOTE — Assessment & Plan Note (Addendum)
He has significant symptoms of acid reflux disease.  He has had dilation for esophageal stricture in the past as well as gastric surgery for peptic ulcer disease.  I will start him back on Protonix 40 mg daily.  I will also refer him to gastroenterology for further evaluation and management.  Obtain CBC, CMET.

## 2022-04-14 NOTE — Assessment & Plan Note (Addendum)
The patient's blood pressure is controlled on his current regimen.  Continue current Rx with Toprol-XL 25 mg daily.

## 2022-04-14 NOTE — Patient Instructions (Signed)
Medication Instructions:  Your physician has recommended you make the following change in your medication:   START Protonix 40 mg taking 1 daily   *If you need a refill on your cardiac medications before your next appointment, please call your pharmacy*   Lab Work: TODAY:  CMET, LIPID, & CBC  If you have labs (blood work) drawn today and your tests are completely normal, you will receive your results only by: [place] (if you have MyChart) OR A paper copy in the mail If you have any lab test that is abnormal or we need to change your treatment, we will call you to review the results.   Testing/Procedures: Your physician recommends you have a CTA Chest on or after 06/29/22. Non-Cardiac CT Angiography (CTA), is a special type of CT scan that uses a computer to produce multi-dimensional views of major blood vessels throughout the body. In CT angiography, a contrast material is injected through an IV to help visualize the blood vessels   You have been referred to [facility].  They will call you with an appointment.  Follow-Up: At [facility], you and your health needs are our priority.  As part of our continuing mission to provide you with exceptional heart care, we have created designated Provider Care Teams.  These Care Teams include your primary Cardiologist (physician) and Advanced Practice Providers (APPs -  Physician Assistants and Nurse Practitioners) who all work together to provide you with the care you need, when you need it.  We recommend signing up for the patient portal called "MyChart".  Sign up information is provided on this After Visit Summary.  MyChart is used to connect with patients for Virtual Visits (Telemedicine).  Patients are able to view lab/test results, encounter notes, upcoming appointments, etc.  Non-urgent messages can be sent to your provider as well.   To learn more about what you can do with MyChart, go to [email or web address].     Your next appointment:   6 month(s)  Provider:   Candee Furbish, MD     Other Instructions

## 2022-04-14 NOTE — Assessment & Plan Note (Signed)
No clinical symptoms.  He does not require diuretic therapy.  Continue current management.

## 2022-04-15 LAB — CBC
Hematocrit: 40.7 % (ref 37.5–51.0)
Hemoglobin: 12.8 g/dL — ABNORMAL LOW (ref 13.0–17.7)
MCH: 27.4 pg (ref 26.6–33.0)
MCHC: 31.4 g/dL — ABNORMAL LOW (ref 31.5–35.7)
MCV: 87 fL (ref 79–97)
Platelets: 281 10*3/uL (ref 150–450)
RBC: 4.68 x10E6/uL (ref 4.14–5.80)
RDW: 14.4 % (ref 11.6–15.4)
WBC: 12.6 10*3/uL — ABNORMAL HIGH (ref 3.4–10.8)

## 2022-04-15 LAB — COMPREHENSIVE METABOLIC PANEL
ALT: 13 IU/L (ref 0–44)
AST: 23 IU/L (ref 0–40)
Albumin/Globulin Ratio: 1.8 (ref 1.2–2.2)
Albumin: 4.2 g/dL (ref 3.8–4.8)
Alkaline Phosphatase: 121 IU/L (ref 44–121)
BUN/Creatinine Ratio: 19 (ref 10–24)
BUN: 19 mg/dL (ref 8–27)
Bilirubin Total: 0.5 mg/dL (ref 0.0–1.2)
CO2: 25 mmol/L (ref 20–29)
Calcium: 9.2 mg/dL (ref 8.6–10.2)
Chloride: 100 mmol/L (ref 96–106)
Creatinine, Ser: 0.98 mg/dL (ref 0.76–1.27)
Globulin, Total: 2.3 g/dL (ref 1.5–4.5)
Glucose: 107 mg/dL — ABNORMAL HIGH (ref 70–99)
Potassium: 4.2 mmol/L (ref 3.5–5.2)
Sodium: 141 mmol/L (ref 134–144)
Total Protein: 6.5 g/dL (ref 6.0–8.5)
eGFR: 81 mL/min/{1.73_m2} (ref >59)

## 2022-04-15 LAB — LIPID PANEL
Chol/HDL Ratio: 3.8 ratio (ref 0.0–5.0)
Cholesterol, Total: 171 mg/dL (ref 100–199)
HDL: 45 mg/dL (ref >39)
LDL Chol Calc (NIH): 106 mg/dL — ABNORMAL HIGH (ref 0–99)
Triglycerides: 112 mg/dL (ref 0–149)
VLDL Cholesterol Cal: 20 mg/dL (ref 5–40)

## 2022-04-17 NOTE — Telephone Encounter (Signed)
He really needs to see a gastroenterologist. Having to take a medication like Protonix to control symptoms indicates that there may be a more significant issue that needs to be addressed. This is best evaluated by a gastroenterologist.  [name], PA-C    04/17/2022 8:33 AM

## 2022-04-19 ENCOUNTER — Telehealth: Payer: Self-pay | Admitting: *Deleted

## 2022-04-19 DIAGNOSIS — E785 Hyperlipidemia, unspecified: Secondary | ICD-10-CM

## 2022-04-19 MED ORDER — ROSUVASTATIN CALCIUM 40 MG PO TABS: 40.0000 mg | ORAL_TABLET | Freq: Every day | ORAL | 3 refills | Status: AC

## 2022-04-19 MED ORDER — ROSUVASTATIN CALCIUM 40 MG PO TABS: 40.0000 mg | ORAL_TABLET | Freq: Every day | ORAL | 3 refills | Status: DC

## 2022-04-19 NOTE — Addendum Note (Signed)
Addended by: Gaetano Net on: 04/19/2022 01:52 PM   Modules accepted: Orders

## 2022-04-19 NOTE — Telephone Encounter (Signed)
-----   Message from Liliane Shi, [place] sent at 04/17/2022  8:59 AM EDT ----- Results sent to [facility] via [place]. See MyChart comment. PLAN: -DC Atorvastatin -Start Rosuvastatin 40 mg once daily. -Fasting Lipids and LFTs in 3 mos.   Blake Sutton  Your creatinine (kidney function), potassium, liver enzymes (AST, ALT) are normal. Your LDL cholesterol is above goal. Your hemoglobin (blood count) is mildly low. Your glucose (sugar) is mildly elevated. Follow up with your primary care provider for the mildly low hemoglobin and mildly elevated glucose. I will send a copy of your labs to Serita Grammes, MD. I will change your cholesterol medication from Atorvastatin to Rosuvastatin to see if we can get the LDL cholesterol below 70, which is the goal.  [name], PA-C    04/17/2022 8:54 AM

## 2022-06-30 ENCOUNTER — Other Ambulatory Visit: Payer: Self-pay

## 2022-06-30 ENCOUNTER — Ambulatory Visit (HOSPITAL_COMMUNITY)
Admission: RE | Admit: 2022-06-30 | Discharge: 2022-06-30 | Disposition: A | Discharge status: Home / Self Care | Payer: Medicare Other | Source: Ambulatory Visit | Attending: Physician Assistant | Admitting: Physician Assistant

## 2022-06-30 DIAGNOSIS — I2510 Atherosclerotic heart disease of native coronary artery without angina pectoris: Secondary | ICD-10-CM

## 2022-06-30 DIAGNOSIS — I712 Thoracic aortic aneurysm, without rupture, unspecified: Secondary | ICD-10-CM | POA: Diagnosis not present

## 2022-06-30 DIAGNOSIS — R079 Chest pain, unspecified: Secondary | ICD-10-CM

## 2022-06-30 DIAGNOSIS — R911 Solitary pulmonary nodule: Secondary | ICD-10-CM

## 2022-06-30 DIAGNOSIS — I700 Atherosclerosis of aorta: Secondary | ICD-10-CM | POA: Diagnosis not present

## 2022-06-30 DIAGNOSIS — J439 Emphysema, unspecified: Secondary | ICD-10-CM | POA: Diagnosis not present

## 2022-06-30 MED ORDER — IOHEXOL 350 MG/ML SOLN
75.0000 mL | Freq: Once | INTRAVENOUS | Status: AC | PRN
  Administered 2022-06-30: 75 mL via INTRAVENOUS

## 2022-07-21 ENCOUNTER — Ambulatory Visit: Payer: Medicare Other

## 2022-07-28 ENCOUNTER — Ambulatory Visit: Payer: Medicare Other | Attending: Physician Assistant

## 2022-07-28 DIAGNOSIS — E785 Hyperlipidemia, unspecified: Secondary | ICD-10-CM | POA: Diagnosis not present

## 2022-07-28 LAB — HEPATIC FUNCTION PANEL
ALT: 11 IU/L (ref 0–44)
AST: 13 IU/L (ref 0–40)
Albumin: 4 g/dL (ref 3.8–4.8)
Alkaline Phosphatase: 80 IU/L (ref 44–121)
Bilirubin Total: 0.4 mg/dL (ref 0.0–1.2)
Bilirubin, Direct: 0.14 mg/dL (ref 0.00–0.40)
Total Protein: 6 g/dL (ref 6.0–8.5)

## 2022-07-28 LAB — LIPID PANEL
Chol/HDL Ratio: 2.4 ratio (ref 0.0–5.0)
Cholesterol, Total: 115 mg/dL (ref 100–199)
HDL: 47 mg/dL (ref >39)
LDL Chol Calc (NIH): 49 mg/dL (ref 0–99)
Triglycerides: 103 mg/dL (ref 0–149)
VLDL Cholesterol Cal: 19 mg/dL (ref 5–40)

## 2022-08-28 IMAGING — CT CT ANGIO CHEST
3 series · 18 of 32 positions shown · IV contrast (APPLIED)
Comparison: Previous studies including the examination done on
08/11/2020

CLINICAL DATA: Thoracic aortic aneurysm

EXAM:
CT ANGIOGRAPHY CHEST WITH CONTRAST
TECHNIQUE: Multidetector CT imaging of the chest was performed using the
standard protocol during bolus administration of intravenous
contrast. Multiplanar CT image reconstructions and MIPs were
obtained to evaluate the vascular anatomy.

[Series 4: chest angio · axial · 0.79mm/px · z∈[-268,-166]mm · 2 of 102 slices shown]
[im 34/102  lung]
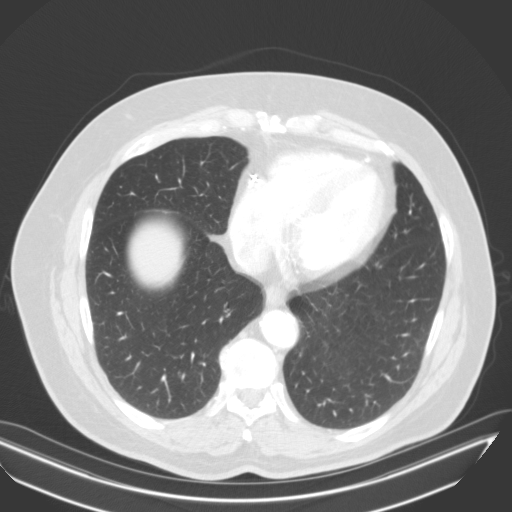
[im 68/102  soft-tissue]
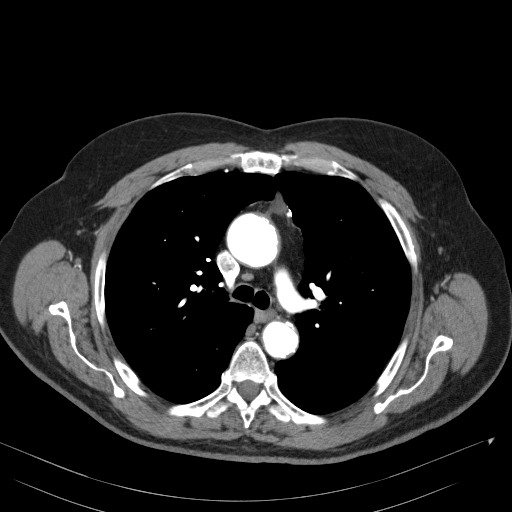

[Series 9: thins · axial · 0.79mm/px · z∈[-341,-87]mm · 8 of 375 slices shown]
[im 29/375  lung]
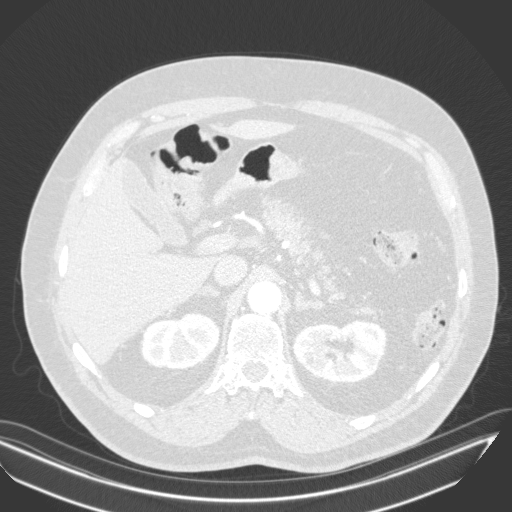
[im 87/375  lung]
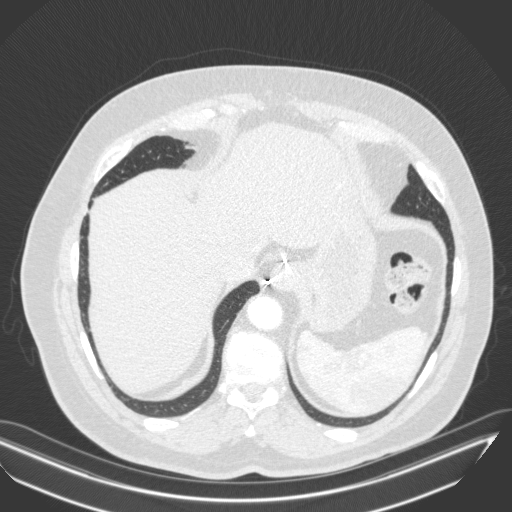
[im 116/375  lung]
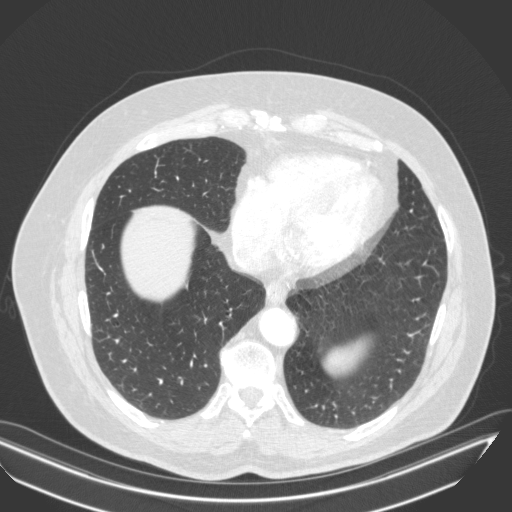
[im 173/375  lung]
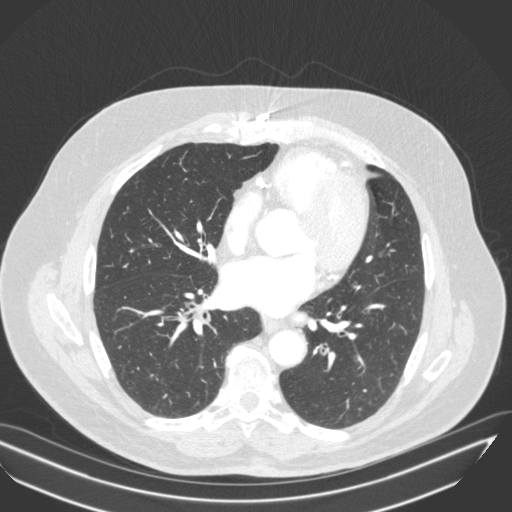
[im 202/375  lung]
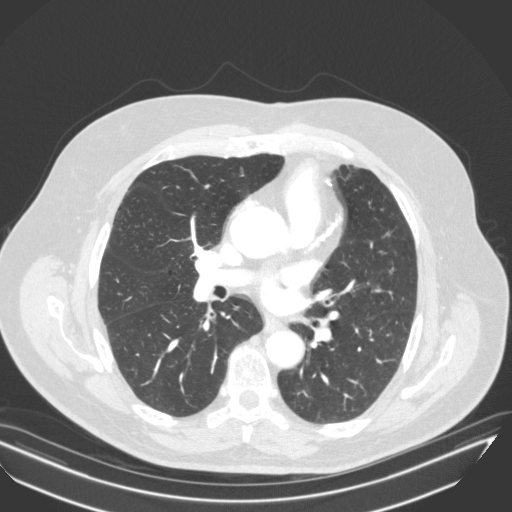
[im 259/375  lung]
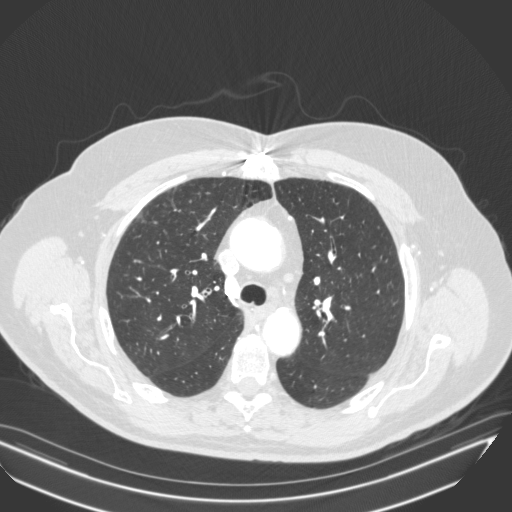
[im 288/375  lung]
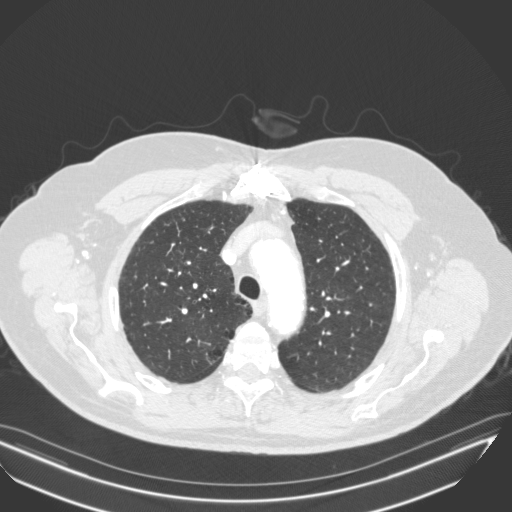
[im 346/375  lung]
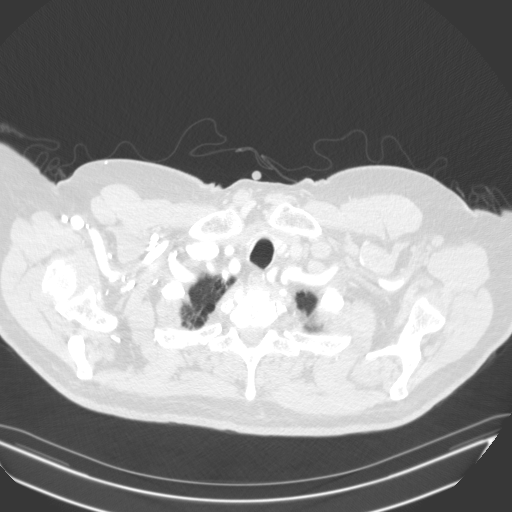

[Series 10: super d · axial · 0.79mm/px · z∈[-341,-87]mm · 8 of 375 slices shown]
[im 29/375  lung]
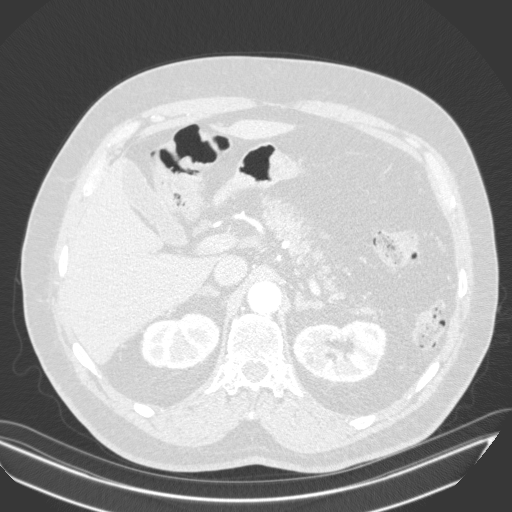
[im 87/375  lung]
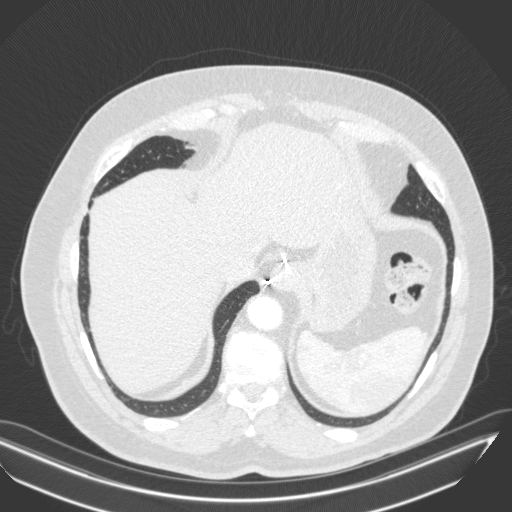
[im 116/375  lung]
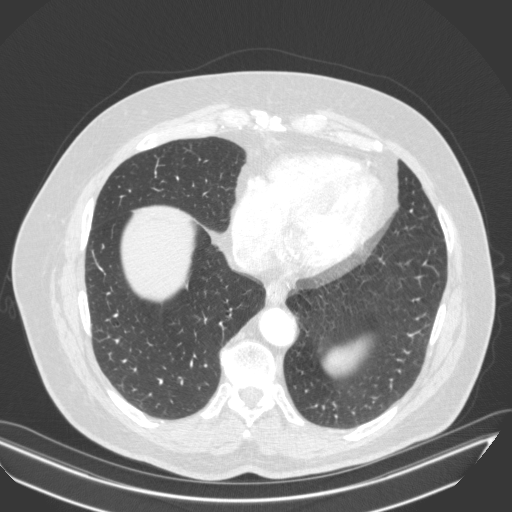
[im 173/375  lung]
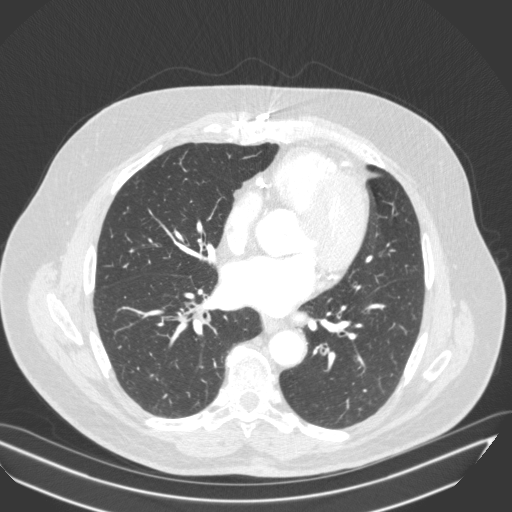
[im 202/375  lung]
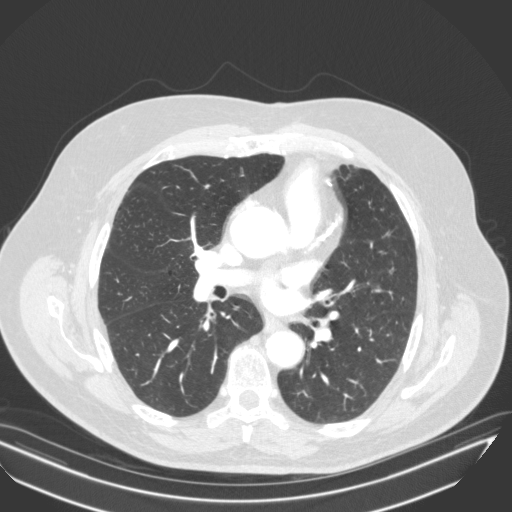
[im 259/375  lung]
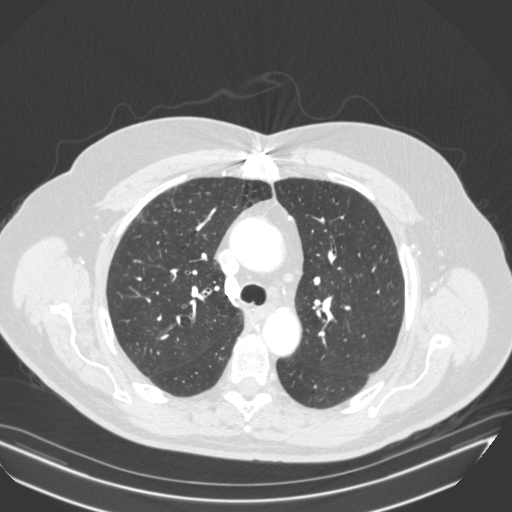
[im 288/375  lung]
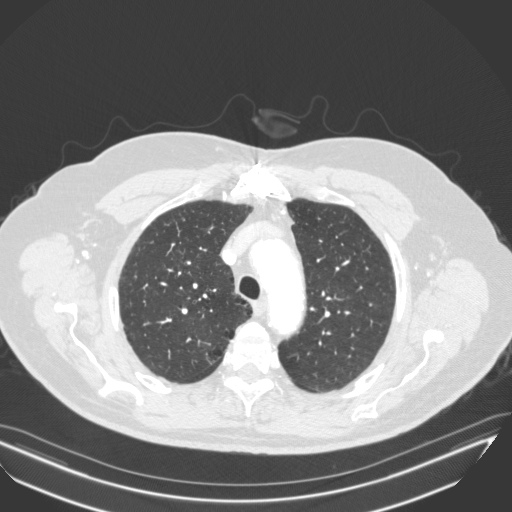
[im 346/375  lung]
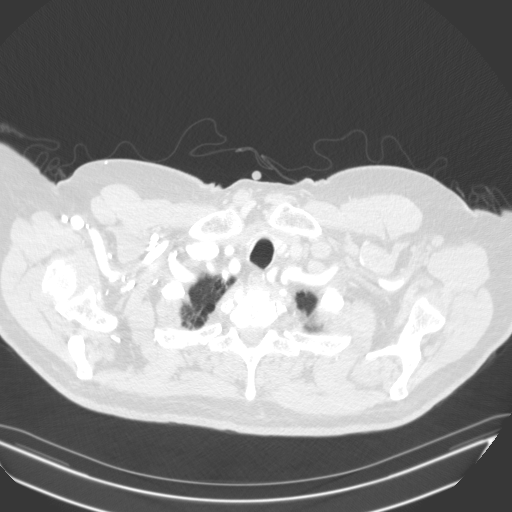

[18 of 32 positions shown; findings below may reference images not displayed]

RADIATION DOSE REDUCTION: This exam was performed according to the
departmental dose-optimization program which includes automated
exposure control, adjustment of the mA and/or kV according to
patient size and/or use of iterative reconstruction technique.

CONTRAST:  75mL J1EMEA-X9I IOPAMIDOL (J1EMEA-X9I) INJECTION 76%
FINDINGS: Cardiovascular: There is homogeneous enhancement in thoracic aorta.
There is no demonstrable intimal flap. There are scattered
atherosclerotic plaques and calcifications. There is previous
coronary artery bypass surgery. Coronary artery calcifications are
seen. Ascending thoracic aorta at the level of sinus of Valsalva
measures 4 cm. Immediately cephalad to the sinuses aortic aneurysm
is 3.3 cm. Ascending thoracic aorta at the level of main pulmonary
artery measures up to 4.3 cm. Aortic arch measures 3.1 cm.
Descending thoracic aorta measures 2.7 cm. There are no intraluminal
filling defects in the central pulmonary artery branches.

Mediastinum/Nodes: No interval changes are noted in the
subcentimeter nodes in the mediastinum.

Lungs/Pleura: Blebs and bullae are seen in the right apex. There is
no focal pulmonary consolidation. In image 18 of series 11 there is
9 x 5 mm linear nodular density in the lateral aspect of right apex
with no significant change. There is no pleural effusion or
pneumothorax.

Upper Abdomen: There is fatty infiltration in the liver. Surgical
clips are noted near the gastroesophageal junction. There are few
low-density lesions in the upper poles of both kidneys, possibly
renal cysts. Diverticula are noted in the visualized portions of
colon.

Musculoskeletal: Metallic sutures seen in the sternum residual from
coronary bypass surgery. Slight decrease in height of body of T3
vertebra appears stable.

Review of the MIP images confirms the above findings.
IMPRESSION: There is no evidence of thoracic aortic dissection. There is
fusiform aneurysm in the ascending thoracic aorta measuring up to
4.3 cm. This finding has not changed significantly. Ascending
thoracic aortic aneurysm. Recommend semi-annual imaging followup by
CTA or MRA and referral to cardiothoracic surgery if not already
obtained. This recommendation follows 0252
ACCF/AHA/AATS/ACR/ASA/SCA/JOSHJAX/DELOUG/SERINA/AMEET Guidelines for the
Diagnosis and Management of Patients With Thoracic Aortic Disease.
Circulation. 0252; 121: E266-e369. Aortic aneurysm NOS (LF6DY-GAL.I)

There is no evidence of central pulmonary artery embolism. There is
no focal pulmonary consolidation. 9 mm linear nodular density in the
right apex appears stable.

Coronary artery disease.  Fatty liver.

Other findings as described in the body of the report.

## 2022-10-03 ENCOUNTER — Other Ambulatory Visit: Payer: Self-pay | Admitting: Cardiology

## 2022-10-03 ENCOUNTER — Other Ambulatory Visit: Payer: Self-pay | Admitting: Physician Assistant

## 2022-10-17 ENCOUNTER — Ambulatory Visit: Payer: Medicare Other | Attending: Cardiology | Admitting: Cardiology

## 2022-10-18 ENCOUNTER — Encounter: Payer: Self-pay | Admitting: Cardiology

## 2022-12-26 ENCOUNTER — Ambulatory Visit (HOSPITAL_COMMUNITY)
Admission: RE | Admit: 2022-12-26 | Discharge: 2022-12-26 | Disposition: A | Discharge status: Home / Self Care | Payer: Medicare Other | Source: Ambulatory Visit | Attending: Physician Assistant | Admitting: Physician Assistant

## 2022-12-26 DIAGNOSIS — I2510 Atherosclerotic heart disease of native coronary artery without angina pectoris: Secondary | ICD-10-CM | POA: Insufficient documentation

## 2022-12-26 DIAGNOSIS — J439 Emphysema, unspecified: Secondary | ICD-10-CM | POA: Diagnosis not present

## 2022-12-26 DIAGNOSIS — R911 Solitary pulmonary nodule: Secondary | ICD-10-CM | POA: Insufficient documentation

## 2022-12-26 DIAGNOSIS — R079 Chest pain, unspecified: Secondary | ICD-10-CM | POA: Insufficient documentation

## 2022-12-26 DIAGNOSIS — I712 Thoracic aortic aneurysm, without rupture, unspecified: Secondary | ICD-10-CM | POA: Diagnosis not present

## 2022-12-26 MED ORDER — SODIUM CHLORIDE (PF) 0.9 % IJ SOLN
INTRAMUSCULAR | Status: AC
  Filled 2022-12-26: qty 50

## 2022-12-26 MED ORDER — IOHEXOL 350 MG/ML SOLN
100.0000 mL | Freq: Once | INTRAVENOUS | Status: AC | PRN
  Administered 2022-12-26: 100 mL via INTRAVENOUS

## 2023-01-03 ENCOUNTER — Encounter: Payer: Self-pay | Admitting: *Deleted

## 2023-01-03 ENCOUNTER — Telehealth: Payer: Self-pay | Admitting: *Deleted

## 2023-01-03 ENCOUNTER — Encounter: Payer: Self-pay | Admitting: Cardiology

## 2023-01-03 ENCOUNTER — Ambulatory Visit: Payer: Medicare Other | Attending: Cardiology | Admitting: Cardiology

## 2023-01-03 VITALS — BP 132/74 | HR 55 | Ht 71.0 in | Wt 184.0 lb

## 2023-01-03 DIAGNOSIS — Z720 Tobacco use: Secondary | ICD-10-CM | POA: Diagnosis not present

## 2023-01-03 DIAGNOSIS — I712 Thoracic aortic aneurysm, without rupture, unspecified: Secondary | ICD-10-CM

## 2023-01-03 DIAGNOSIS — I77819 Aortic ectasia, unspecified site: Secondary | ICD-10-CM

## 2023-01-03 DIAGNOSIS — I2510 Atherosclerotic heart disease of native coronary artery without angina pectoris: Secondary | ICD-10-CM | POA: Diagnosis not present

## 2023-01-03 DIAGNOSIS — E785 Hyperlipidemia, unspecified: Secondary | ICD-10-CM

## 2023-01-03 MED ORDER — NITROGLYCERIN 0.4 MG SL SUBL: 0.4000 mg | SUBLINGUAL_TABLET | SUBLINGUAL | 3 refills | Status: AC | PRN

## 2023-01-03 MED ORDER — EZETIMIBE 10 MG PO TABS: 10.0000 mg | ORAL_TABLET | Freq: Every day | ORAL | 0 refills | Status: DC

## 2023-01-03 NOTE — Telephone Encounter (Signed)
-----   Message from Tereso Newcomer sent at 01/02/2023  9:23 PM EST ----- Results sent to [facility] via [facility]. I will send a copy to PCP as FYI. See MyChart comments below: PLAN: -Repeat gated chest/aorta CT for thoracic aortic aneurysm (please make sure it is scheduled for 12/2023 - this CT was done early)  Blake Sutton  Your CT shows stable size of your ascending thoracic aorta. There is a small benign nodule in the right lung and 2 simple (benign) cysts in the left kidney. These do not need any follow up imaging. I will arrange a repeat CT in 1 year to continue to monitor the aorta size. Continue current medications/treatment plan and follow up as scheduled.  Tereso Newcomer, PA-C    01/02/2023 9:16 PM

## 2023-01-03 NOTE — Patient Instructions (Signed)
Medication Instructions:  The current medical regimen is effective;  continue present plan and medications.  *If you need a refill on your cardiac medications before your next appointment, please call your pharmacy*  Follow-Up: At [facility], you and your health needs are our priority.  As part of our continuing mission to provide you with exceptional heart care, we have created designated Provider Care Teams.  These Care Teams include your primary Cardiologist (physician) and Advanced Practice Providers (APPs -  Physician Assistants and Nurse Practitioners) who all work together to provide you with the care you need, when you need it.  We recommend signing up for the patient portal called "MyChart".  Sign up information is provided on this After Visit Summary.  MyChart is used to connect with patients for Virtual Visits (Telemedicine).  Patients are able to view lab/test results, encounter notes, upcoming appointments, etc.  Non-urgent messages can be sent to your provider as well.   To learn more about what you can do with MyChart, go to [email or web address].    Your next appointment:   2 year(s)  Provider:   Donato Schultz, MD

## 2023-01-03 NOTE — Progress Notes (Signed)
Cardiology Office Note:  .   Date:  01/03/2023  ID:  Blake Sutton, DOB [birth year], MRN [id number] PCP: Buckner Malta, MD  [facility] Providers Cardiologist:  Donato Schultz, MD Cardiology APP:  Beatrice Lecher, PA-C    History of Present Illness: .   Blake Sutton is a 77 y.o. male Discussed with the use of AI scribe History of Present Illness   The patient, a 77 year old with a history of coronary artery disease, post myocardial infarction, and CABG, presents for a routine follow-up. He has a history of GERD and dyspepsia, which has been exacerbated by certain foods and lying down. However, he reports no current adrenal symptoms. His medication regimen includes aspirin 81 mg, toprol 25 mg, Crestor 40 mg for hyperlipidemia, and Zetia 10 mg. His LDL is well-controlled at 49.  The patient has a stable aortic aneurysm, with a repeat CT showing a maximum diameter of 4.2 cm of the ascending aorta. He reports no new cardiac symptoms, with no chest pain or breathing difficulties. He maintains an active lifestyle, walking daily around the block.  The patient also reports significant stress related to caring for his 67 year old mother, who has dementia. This has added emotional strain but has not resulted in any new physical symptoms. Despite this, the patient remains committed to staying active and mobile.           Studies Reviewed: Marland Kitchen   EKG Interpretation Date/Time:  Wednesday January 03 2023 08:56:33 EST Ventricular Rate:  55 PR Interval:  158 QRS Duration:  84 QT Interval:  432 QTC Calculation: 413 R Axis:   69  Text Interpretation: Sinus bradycardia Nonspecific ST abnormality When compared with ECG of 04-Sep-2020 21:32, Left posterior fascicular block is no longer Present ST elevation has replaced ST depression in Inferior leads Non-specific change in ST segment in Lateral leads T wave inversion less evident in Inferior leads Confirmed by Donato Schultz ([id number]) on  01/03/2023 9:01:22 AM    Results LABS Hemoglobin: 10 Hemoglobin A1c: 5.9 Creatinine: 0.9  RADIOLOGY CT: Ascending thoracic aorta 5.3 cm, mild carotid plaque (06/30/2021) CT: Maximum diameter 4.2 cm of ascending aorta (12/26/2022)  DIAGNOSTIC Echocardiogram: EF 55-60%, mild LVA, RVSP 38 mmHg, moderate MR, aortic sclerosis without stenosis (2022) Nuclear stress test: No ischemia, no infarction, low risk, EF 49% (09/01/2021) EKG: Normal  Risk Assessment/Calculations:            Physical Exam:   VS:  BP 132/74 (BP Location: Left Arm, Patient Position: Sitting, Cuff Size: Normal)   Pulse (!) 55   Ht 5\' 11"  (1.803 m)   Wt 184 lb (83.5 kg)   SpO2 95%   BMI 25.66 kg/m    Wt Readings from Last 3 Encounters:  01/03/23 184 lb (83.5 kg)  04/14/22 198 lb 6.4 oz (90 kg)  09/01/21 213 lb (96.6 kg)    GEN: Well nourished, well developed in no acute distress NECK: No JVD; No carotid bruits CARDIAC: RRR, no murmurs, no rubs, no gallops RESPIRATORY:  Clear to auscultation without rales, wheezing or rhonchi  ABDOMEN: Soft, non-tender, non-distended EXTREMITIES:  No edema; No deformity   ASSESSMENT AND PLAN: .    Assessment and Plan    Coronary Artery Disease (CAD) post-Myocardial Infarction with CABG 77 year old with CAD, status post-MI with CABG on 07/28/2020. Echocardiogram in 2022 showed EF 55-60%, mild LVA, RVSP 38 mmHg, moderate MR, and aortic sclerosis without stenosis. Nuclear stress test on 09/01/2021 showed no ischemia or infarction, low risk  with EF of 49%. Currently asymptomatic with no chest pain or dyspnea. Heart rate is well-controlled at 55 bpm on metoprolol 25 mg. Discussed potential benefits and risks of discontinuing metoprolol. Patient prefers to maintain current regimen due to caregiving stress. - Continue aspirin 81 mg daily - Continue Crestor 40 mg daily - Continue Zetia 10 mg daily - Consider discontinuing metoprolol 25 mg - Follow up in 2 years unless new  symptoms arise  Dilated ascending Aortic  CT on 06/30/2021 showed ascending thoracic aorta measuring 4.3 cm. Repeat CT on 12/26/2022 showed stable maximum diameter of 4.2 cm. No new symptoms reported. Discussed monitoring and potential rupture risks. - Consider Repeat CT in 2 years to monitor aortic aneurysm  Hyperlipidemia LDL is well-controlled at 49 mg/dL with current medication regimen. Discussed benefits of maintaining LDL at goal to reduce cardiovascular risk. - Continue Crestor 40 mg daily - Continue Zetia 10 mg daily  GERD Symptoms exacerbated by spicy foods, coffee, and cucumbers, worse when lying down. Currently no new symptoms reported. Discussed lifestyle modifications. - Continue current management and lifestyle modifications  General Health Maintenance Maintaining an active lifestyle, walking daily. Hemoglobin 10, hemoglobin A1c 5.9, creatinine 0.9. 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               Signed, Donato Schultz, MD

## 2023-01-08 NOTE — Progress Notes (Signed)
Pt has been made aware of normal result and verbalized understanding.  jw

## 2023-02-20 ENCOUNTER — Ambulatory Visit: Payer: Medicare Other | Admitting: Cardiology

## 2023-04-03 ENCOUNTER — Other Ambulatory Visit: Payer: Self-pay | Admitting: Cardiology

## 2023-05-24 DIAGNOSIS — K4020 Bilateral inguinal hernia, without obstruction or gangrene, not specified as recurrent: Secondary | ICD-10-CM | POA: Diagnosis not present

## 2023-05-24 DIAGNOSIS — R1031 Right lower quadrant pain: Secondary | ICD-10-CM | POA: Diagnosis not present

## 2023-05-24 DIAGNOSIS — S76211A Strain of adductor muscle, fascia and tendon of right thigh, initial encounter: Secondary | ICD-10-CM | POA: Diagnosis not present

## 2023-05-24 DIAGNOSIS — X500XXA Overexertion from strenuous movement or load, initial encounter: Secondary | ICD-10-CM | POA: Diagnosis not present

## 2023-06-25 DIAGNOSIS — K4021 Bilateral inguinal hernia, without obstruction or gangrene, recurrent: Secondary | ICD-10-CM | POA: Diagnosis not present

## 2023-06-27 DIAGNOSIS — K4020 Bilateral inguinal hernia, without obstruction or gangrene, not specified as recurrent: Secondary | ICD-10-CM | POA: Diagnosis not present

## 2023-07-06 ENCOUNTER — Telehealth: Payer: Self-pay

## 2023-07-06 DIAGNOSIS — G4730 Sleep apnea, unspecified: Secondary | ICD-10-CM | POA: Diagnosis not present

## 2023-07-06 DIAGNOSIS — Z951 Presence of aortocoronary bypass graft: Secondary | ICD-10-CM | POA: Diagnosis not present

## 2023-07-06 DIAGNOSIS — K4020 Bilateral inguinal hernia, without obstruction or gangrene, not specified as recurrent: Secondary | ICD-10-CM | POA: Diagnosis not present

## 2023-07-06 DIAGNOSIS — J449 Chronic obstructive pulmonary disease, unspecified: Secondary | ICD-10-CM | POA: Diagnosis not present

## 2023-07-06 DIAGNOSIS — I10 Essential (primary) hypertension: Secondary | ICD-10-CM | POA: Diagnosis not present

## 2023-07-06 DIAGNOSIS — Z7902 Long term (current) use of antithrombotics/antiplatelets: Secondary | ICD-10-CM | POA: Diagnosis not present

## 2023-07-06 DIAGNOSIS — M1990 Unspecified osteoarthritis, unspecified site: Secondary | ICD-10-CM | POA: Diagnosis not present

## 2023-07-06 DIAGNOSIS — K219 Gastro-esophageal reflux disease without esophagitis: Secondary | ICD-10-CM | POA: Diagnosis not present

## 2023-07-06 DIAGNOSIS — Z79899 Other long term (current) drug therapy: Secondary | ICD-10-CM | POA: Diagnosis not present

## 2023-07-06 DIAGNOSIS — Z7982 Long term (current) use of aspirin: Secondary | ICD-10-CM | POA: Diagnosis not present

## 2023-07-06 DIAGNOSIS — I2510 Atherosclerotic heart disease of native coronary artery without angina pectoris: Secondary | ICD-10-CM | POA: Diagnosis not present

## 2023-07-06 DIAGNOSIS — I252 Old myocardial infarction: Secondary | ICD-10-CM | POA: Diagnosis not present

## 2023-07-06 DIAGNOSIS — E785 Hyperlipidemia, unspecified: Secondary | ICD-10-CM | POA: Diagnosis not present

## 2023-07-06 DIAGNOSIS — F17210 Nicotine dependence, cigarettes, uncomplicated: Secondary | ICD-10-CM | POA: Diagnosis not present

## 2023-07-06 NOTE — Telephone Encounter (Signed)
 I called the pt to schedule a tele preop appt. Pt's son answered the phone and he said they just walked in the door from his dad having his surgery today. I apologized to the pt's son that we just got this today. If the surgeon's office faxed to our office sooner than today, I am not sure what fax they sent to as we did not receive the  fax. Not sure why the surgeon office did not reach out to our office to see if we did receive the request. I again apologized to the pt's son. Pt's son did tell me that the surgery went well and his dad was doing good. I assured the pt's son I will update the cardiologist as well.

## 2023-07-06 NOTE — Telephone Encounter (Signed)
 NEED TO CONFIRME SURGERY DATE

## 2023-07-06 NOTE — Telephone Encounter (Signed)
   Pre-operative Risk Assessment    Patient Name: Blake Sutton  DOB: 12/27/1945 MRN: [id number]   Date of last office visit: 01/03/23 DR. SKAINS  Date of next office visit: NONE   Request for Surgical Clearance    Procedure:  B/L INGUINAL HERNIORRHAPHY   Date of Surgery:  Clearance TBD (CLEARANCE REQUEST HAS A DATE 07/06/23 THAT LOOKS LIKE IS BEING ENTERED AS THE SURGERY DATE; WE CALLED THE SURGEON'S OFFICE TO CONFIRM DATE OF SURGERY THOUGH OFFICE WAS CLOSED. IN FURTHER REVIEW AS WELL OF THE PT'S CHART LOOKS LIKE THE PT JUST SAW DR. Sable Cram TODAY, AS I READ THE NOTES WILL REACH OUT TO CARDIOLOGY FOR  CLEARANCE.)                               Surgeon:  DR. Sharyl Deep Surgeon's Group or Practice Name:  [facility] Phone number:  [phone] Fax number:  [phone]   Type of Clearance Requested:   - Medical  - Pharmacy:  Hold Aspirin  and Clopidogrel  (Plavix )     Type of Anesthesia:  General    Additional requests/questions:    Princeton Broom   07/06/2023, 12:38 PM

## 2023-07-06 NOTE — Telephone Encounter (Signed)
   Name: Blake Sutton  DOB: 23-Jul-1945  MRN: [id number]  Primary Cardiologist: Dorothye Gathers, MD   Preoperative team, please contact this patient and set up a phone call appointment for further preoperative risk assessment. Please obtain consent and complete medication review. Thank you for your help.  I confirm that guidance regarding antiplatelet and oral anticoagulation therapy has been completed and, if necessary, noted below. Per office protocol, if patient is without any new symptoms or concerns at the time of their virtual visit, he may hold ASA for 7 days prior to procedure. Please resume ASA as soon as possible postprocedure, at the discretion of the surgeon.  On review of medications, patient in not listed to be on Plavix .  I also confirmed the patient resides in the state of [place] . As per [facility] Medical Board telemedicine laws, the patient must reside in the state in which the provider is licensed.   Friddie Jetty, NP 07/06/2023, 1:07 PM [facility]

## 2023-09-21 ENCOUNTER — Other Ambulatory Visit: Payer: Self-pay | Admitting: Physician Assistant

## 2023-09-21 ENCOUNTER — Other Ambulatory Visit: Payer: Self-pay | Admitting: Cardiology

## 2023-10-31 ENCOUNTER — Telehealth: Payer: Self-pay | Admitting: Cardiology

## 2023-10-31 DIAGNOSIS — I2510 Atherosclerotic heart disease of native coronary artery without angina pectoris: Secondary | ICD-10-CM

## 2023-10-31 NOTE — Telephone Encounter (Signed)
 Order placed for Lexiscan  per Dr Jeffrie for DOT physical clearance.  Pt will be contacted to be scheduled.  Instructions sent to pt via MyChart

## 2023-10-31 NOTE — Telephone Encounter (Signed)
 Message routed to Dr. Jeffrie Last visit 12/2022

## 2023-10-31 NOTE — Telephone Encounter (Signed)
 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 Fax number: [phone]

## 2023-11-01 ENCOUNTER — Telehealth (HOSPITAL_COMMUNITY): Payer: Self-pay | Admitting: *Deleted

## 2023-11-01 NOTE — Telephone Encounter (Signed)
 Pt given instructions for stress test.

## 2023-11-02 ENCOUNTER — Other Ambulatory Visit: Payer: Self-pay | Admitting: Cardiology

## 2023-11-02 DIAGNOSIS — I2510 Atherosclerotic heart disease of native coronary artery without angina pectoris: Secondary | ICD-10-CM

## 2023-11-06 ENCOUNTER — Ambulatory Visit (HOSPITAL_COMMUNITY)
Admission: RE | Admit: 2023-11-06 | Discharge: 2023-11-06 | Disposition: A | Discharge status: Home / Self Care | Source: Ambulatory Visit | Attending: Cardiology | Admitting: Cardiology

## 2023-11-06 ENCOUNTER — Ambulatory Visit: Payer: Self-pay | Admitting: Cardiology

## 2023-11-06 ENCOUNTER — Encounter: Payer: Self-pay | Admitting: Cardiology

## 2023-11-06 DIAGNOSIS — I2510 Atherosclerotic heart disease of native coronary artery without angina pectoris: Secondary | ICD-10-CM | POA: Insufficient documentation

## 2023-11-06 DIAGNOSIS — I700 Atherosclerosis of aorta: Secondary | ICD-10-CM | POA: Diagnosis not present

## 2023-11-06 LAB — MYOCARDIAL PERFUSION IMAGING
LV dias vol: 138 mL (ref 62–150)
LV sys vol: 61 mL (ref 4.2–5.8)
Nuc Stress EF: 56 %
Peak HR: 77 {beats}/min
Rest HR: 54 {beats}/min
Rest Nuclear Isotope Dose: 10.2 mCi
SDS: 0
SRS: 1
SSS: 1
ST Depression (mm): 0 mm
Stress Nuclear Isotope Dose: 32.8 mCi
TID: 1.1

## 2023-11-06 MED ORDER — TECHNETIUM TC 99M TETROFOSMIN IV KIT
10.2000 | PACK | Freq: Once | INTRAVENOUS | Status: AC | PRN
  Administered 2023-11-06: 10.2 via INTRAVENOUS

## 2023-11-06 MED ORDER — REGADENOSON 0.4 MG/5ML IV SOLN
INTRAVENOUS | Status: AC
  Filled 2023-11-06: qty 5

## 2023-11-06 MED ORDER — REGADENOSON 0.4 MG/5ML IV SOLN
0.4000 mg | Freq: Once | INTRAVENOUS | Status: AC
  Administered 2023-11-06: 0.4 mg via INTRAVENOUS

## 2023-11-06 MED ORDER — TECHNETIUM TC 99M TETROFOSMIN IV KIT
32.8000 | PACK | Freq: Once | INTRAVENOUS | Status: AC | PRN
  Administered 2023-11-06: 32.8 via INTRAVENOUS

## 2023-12-17 ENCOUNTER — Other Ambulatory Visit: Payer: Self-pay | Admitting: Cardiology

## 2023-12-17 ENCOUNTER — Other Ambulatory Visit: Payer: Self-pay | Admitting: Physician Assistant

## 2023-12-24 ENCOUNTER — Ambulatory Visit (HOSPITAL_COMMUNITY)
Admission: RE | Admit: 2023-12-24 | Discharge: 2023-12-24 | Disposition: A | Discharge status: Home / Self Care | Source: Ambulatory Visit | Attending: Cardiology | Admitting: Cardiology

## 2023-12-24 DIAGNOSIS — I712 Thoracic aortic aneurysm, without rupture, unspecified: Secondary | ICD-10-CM | POA: Diagnosis present

## 2023-12-24 MED ORDER — IOHEXOL 350 MG/ML SOLN
75.0000 mL | Freq: Once | INTRAVENOUS | Status: AC | PRN
  Administered 2023-12-24: 75 mL via INTRAVENOUS

## 2023-12-26 ENCOUNTER — Ambulatory Visit: Payer: Self-pay | Admitting: Physician Assistant

## 2023-12-26 DIAGNOSIS — I712 Thoracic aortic aneurysm, without rupture, unspecified: Secondary | ICD-10-CM

## 2024-01-10 ENCOUNTER — Other Ambulatory Visit: Payer: Self-pay

## 2024-01-10 DIAGNOSIS — J439 Emphysema, unspecified: Secondary | ICD-10-CM

## 2024-01-10 NOTE — Telephone Encounter (Signed)
 Followed up from mychart message that wasn't responded to. Pt needs a low dose CT for lung cancer screening due to emphysema changes on CTA. Pt would prefer to see a pulmonologist. Referral sent per Glendia Ferrier PA.

## 2024-01-15 ENCOUNTER — Ambulatory Visit

## 2024-01-21 ENCOUNTER — Ambulatory Visit

## 2024-02-13 ENCOUNTER — Encounter: Payer: Self-pay | Admitting: Pulmonary Disease

## 2024-02-13 ENCOUNTER — Ambulatory Visit: Admitting: Pulmonary Disease

## 2024-02-13 VITALS — BP 147/85 | HR 68 | Temp 97.6°F | Ht 70.0 in | Wt 188.0 lb

## 2024-02-13 DIAGNOSIS — F17210 Nicotine dependence, cigarettes, uncomplicated: Secondary | ICD-10-CM | POA: Diagnosis not present

## 2024-02-13 DIAGNOSIS — F17200 Nicotine dependence, unspecified, uncomplicated: Secondary | ICD-10-CM

## 2024-02-13 DIAGNOSIS — J439 Emphysema, unspecified: Secondary | ICD-10-CM

## 2024-02-13 DIAGNOSIS — J438 Other emphysema: Secondary | ICD-10-CM

## 2024-02-13 NOTE — Progress Notes (Signed)
 [address]              Blake Sutton    [id number]    03-29-1945  Primary Care Physician:Burgart, Delon, MD  Referring Physician: Lelon Glendia DASEN, PA-C [address] [place],  [place] [address]  Chief complaint: Consult for emphysema, lung cancer screening  HPI: 79 y.o. who  has a past medical history of Aortic atherosclerosis, CAD (coronary artery disease), COPD (chronic obstructive pulmonary disease) (HCC), Hypercholesteremia, Hypertension, Prostate cancer (HCC), and Thoracic aortic aneurysm.  Discussed the use of AI scribe software for clinical note transcription with the patient, who gave verbal consent to proceed.  History of Present Illness Blake Sutton is a 79 year old male who presents for evaluation of emphysema noted on a recent CT scan. He is accompanied by his son, Blake Sutton. He was referred by Dr. Glendia Lelon for evaluation of lung changes noted on a CT scan.  Emphysematous lung changes - Recent CT scan obtained for heart aneurysm monitoring revealed emphysematous changes in the lungs, suspected to be smoking related. - Mild cough present. - No dyspnea, wheezing, or severe cough. - No exertional shortness of breath. - Walks daily without limitation. - No recent decline in exercise tolerance or daily activities.  Cardiovascular history - History of heart surgery for artery repair. - Under surveillance for heart aneurysm. - Remains physically active.  Relevant Pulmonary history: Pets: Dog Occupation: Retired naval architect Exposures: No mold, hot tub, Financial Controller.  No feather pillows or comforters No h/o chemo/XRT/amiodarone/macrodantin/MTX  No exposure to asbestos, silica or other organic allergens  Smoking history: 30-pack-year smoker.  Continues to smoke half pack per day Travel history: No significant travel history Family history: No family history of lung disease   Outpatient Encounter Medications as of 02/13/2024  Medication Sig   aspirin  81 MG EC tablet Take 1  tablet (81 mg total) by mouth daily.   metoprolol  succinate (TOPROL -XL) 25 MG 24 hr tablet Take 1 tablet by mouth once daily   nitroGLYCERIN  (NITROSTAT ) 0.4 MG SL tablet Place 1 tablet (0.4 mg total) under the tongue every 5 (five) minutes as needed for chest pain.   pantoprazole  (PROTONIX ) 40 MG tablet Take 1 tablet by mouth once daily   rosuvastatin  (CRESTOR ) 40 MG tablet Take 1 tablet (40 mg total) by mouth daily.   clopidogrel  (PLAVIX ) 75 MG tablet Take 75 mg by mouth.   ezetimibe  (ZETIA ) 10 MG tablet Take 1 tablet by mouth once daily (Patient not taking: Reported on 02/13/2024)   No facility-administered encounter medications on file as of 02/13/2024.     Physical Exam: Today's Vitals   02/13/24 0820  BP: (!) 186/90  Pulse: 68  Temp: 97.6 F (36.4 C)  TempSrc: Oral  SpO2: 97%  Weight: 188 lb (85.3 kg)  Height: 5' 10 (1.778 m)   Body mass index is 26.98 kg/m.  Physical Exam GEN: No acute distress CV: Regular rate and rhythm no murmurs LUNGS: Clear to auscultation bilaterally normal respiratory effort SKIN JOINTS: Warm and dry no rash  Data Reviewed: Imaging: CT angio chest-visualized lungs show mild emphysema, bullous changes in the apices.  Stable ascending aorta dilatation 4.2 cm.  I had reviewed the images personally.  PFTs:  Labs:  Assessment and Plan Assessment & Plan Emphysema CT scan reveals emphysematous changes, likely due to smoking. No significant symptoms such as dyspnea or cough. Lungs are clear on auscultation. Differential includes COPD, but further evaluation is needed. - Ordered lung function test to assess  lung function and evaluate for COPD. - Scheduled annual screening scan for lung health. - Advised continuation of current exercise regimen.  Nicotine dependence Long-term smoking history, currently smoking less than a pack a day. Acknowledges health risks but is not ready to quit. Discussed cessation options including nicotine patches and  Chantix.  Time spent counseling-5 minutes.  Reassess at return visit. - Discussed smoking cessation options, including nicotine patches and Chantix. - Encouraged consideration of smoking cessation to prevent further lung damage.  Recommendations: PFTs Referral for annual screening CTs Smoking cessation encouraged  Lareina Espino MD [facility] 02/13/2024, 8:26 AM  CC: Lelon Hamilton T, PA-C   "

## 2024-02-13 NOTE — Patient Instructions (Signed)
" °  VISIT SUMMARY: You came in today to discuss the emphysema found on your recent CT scan. We talked about your smoking history and how it might be affecting your lungs. We also discussed your overall health and planned some tests to better understand your lung function.  YOUR PLAN: EMPHYSEMA: Your recent CT scan showed changes in your lungs that are likely due to smoking. You don't have significant symptoms like shortness of breath or a severe cough right now. -We have ordered a lung function test to check how well your lungs are working and to see if you have COPD. -You will have an annual screening scan to monitor your lung health. -Continue with your current exercise routine.  NICOTINE DEPENDENCE: You have been smoking for a long time and currently smoke less than a pack a day. We talked about the health risks and some options to help you quit smoking. -Consider using nicotine patches or a medication called Chantix to help you quit smoking. -Think about quitting smoking to prevent further damage to your lungs. "

## 2024-03-14 ENCOUNTER — Other Ambulatory Visit: Payer: Self-pay | Admitting: Cardiology

## 2024-05-20 ENCOUNTER — Ambulatory Visit: Admitting: Pulmonary Disease

## 2024-05-20 ENCOUNTER — Encounter
